# Patient Record
Sex: Male | Born: 1950 | Race: White | Hispanic: No | Marital: Married | Smoking: Never smoker
Health system: Southern US, Community
[De-identification: ages and names within clinical notes are randomized; demographics above are authoritative.]

## PROBLEM LIST (undated history)

## (undated) DIAGNOSIS — E785 Hyperlipidemia, unspecified: Secondary | ICD-10-CM

## (undated) DIAGNOSIS — G5762 Lesion of plantar nerve, left lower limb: Secondary | ICD-10-CM

## (undated) DIAGNOSIS — R7303 Prediabetes: Secondary | ICD-10-CM

## (undated) DIAGNOSIS — E291 Testicular hypofunction: Secondary | ICD-10-CM

## (undated) DIAGNOSIS — R42 Dizziness and giddiness: Secondary | ICD-10-CM

## (undated) DIAGNOSIS — M1990 Unspecified osteoarthritis, unspecified site: Secondary | ICD-10-CM

## (undated) DIAGNOSIS — M109 Gout, unspecified: Secondary | ICD-10-CM

## (undated) DIAGNOSIS — K219 Gastro-esophageal reflux disease without esophagitis: Secondary | ICD-10-CM

## (undated) DIAGNOSIS — M545 Low back pain, unspecified: Secondary | ICD-10-CM

## (undated) DIAGNOSIS — I10 Essential (primary) hypertension: Secondary | ICD-10-CM

## (undated) HISTORY — DX: Hyperlipidemia, unspecified: E78.5

## (undated) HISTORY — DX: Low back pain: M54.5

## (undated) HISTORY — DX: Unspecified osteoarthritis, unspecified site: M19.90

## (undated) HISTORY — DX: Gastro-esophageal reflux disease without esophagitis: K21.9

## (undated) HISTORY — PX: HERNIA REPAIR: SHX51

## (undated) HISTORY — DX: Testicular hypofunction: E29.1

## (undated) HISTORY — DX: Lesion of plantar nerve, left lower limb: G57.62

## (undated) HISTORY — DX: Essential (primary) hypertension: I10

## (undated) HISTORY — PX: EYE SURGERY: SHX253

## (undated) HISTORY — PX: OTHER SURGICAL HISTORY: SHX169

## (undated) HISTORY — PX: FRACTURE SURGERY: SHX138

## (undated) HISTORY — DX: Low back pain, unspecified: M54.50

## (undated) HISTORY — DX: Gout, unspecified: M10.9

---

## 1993-07-30 HISTORY — PX: ACHILLES TENDON SURGERY: SHX542

## 1998-10-02 ENCOUNTER — Emergency Department (HOSPITAL_COMMUNITY): Admission: EM | Admit: 1998-10-02 | Discharge: 1998-10-02 | Payer: Self-pay | Admitting: Emergency Medicine

## 1998-10-02 ENCOUNTER — Encounter: Payer: Self-pay | Admitting: Emergency Medicine

## 2000-07-30 HISTORY — PX: HERNIA REPAIR: SHX51

## 2012-01-02 ENCOUNTER — Ambulatory Visit: Payer: Self-pay | Admitting: Gastroenterology

## 2012-01-02 LAB — HM COLONOSCOPY

## 2012-11-27 LAB — HM HEPATITIS C SCREENING LAB: HM HEPATITIS C SCREENING: NEGATIVE

## 2013-04-06 ENCOUNTER — Ambulatory Visit: Payer: Self-pay | Admitting: Family Medicine

## 2013-04-06 IMAGING — CR DG FOOT COMPLETE 3+V*L*
1 series · 3 of 3 positions shown · non-contrast
Comparison: none

REASON FOR EXAM: L foot pain
COMMENTS:

PROCEDURE:     KDR - KDXR FOOT LT COMP W/OBLIQUES  - [DATE]  [DATE]
RESULT:     There is no evidence of fracture, dislocation, or malalignment.

[Series 1: ap · 0.17mm/px · 3 of 3 slices shown]
[im 1/3]
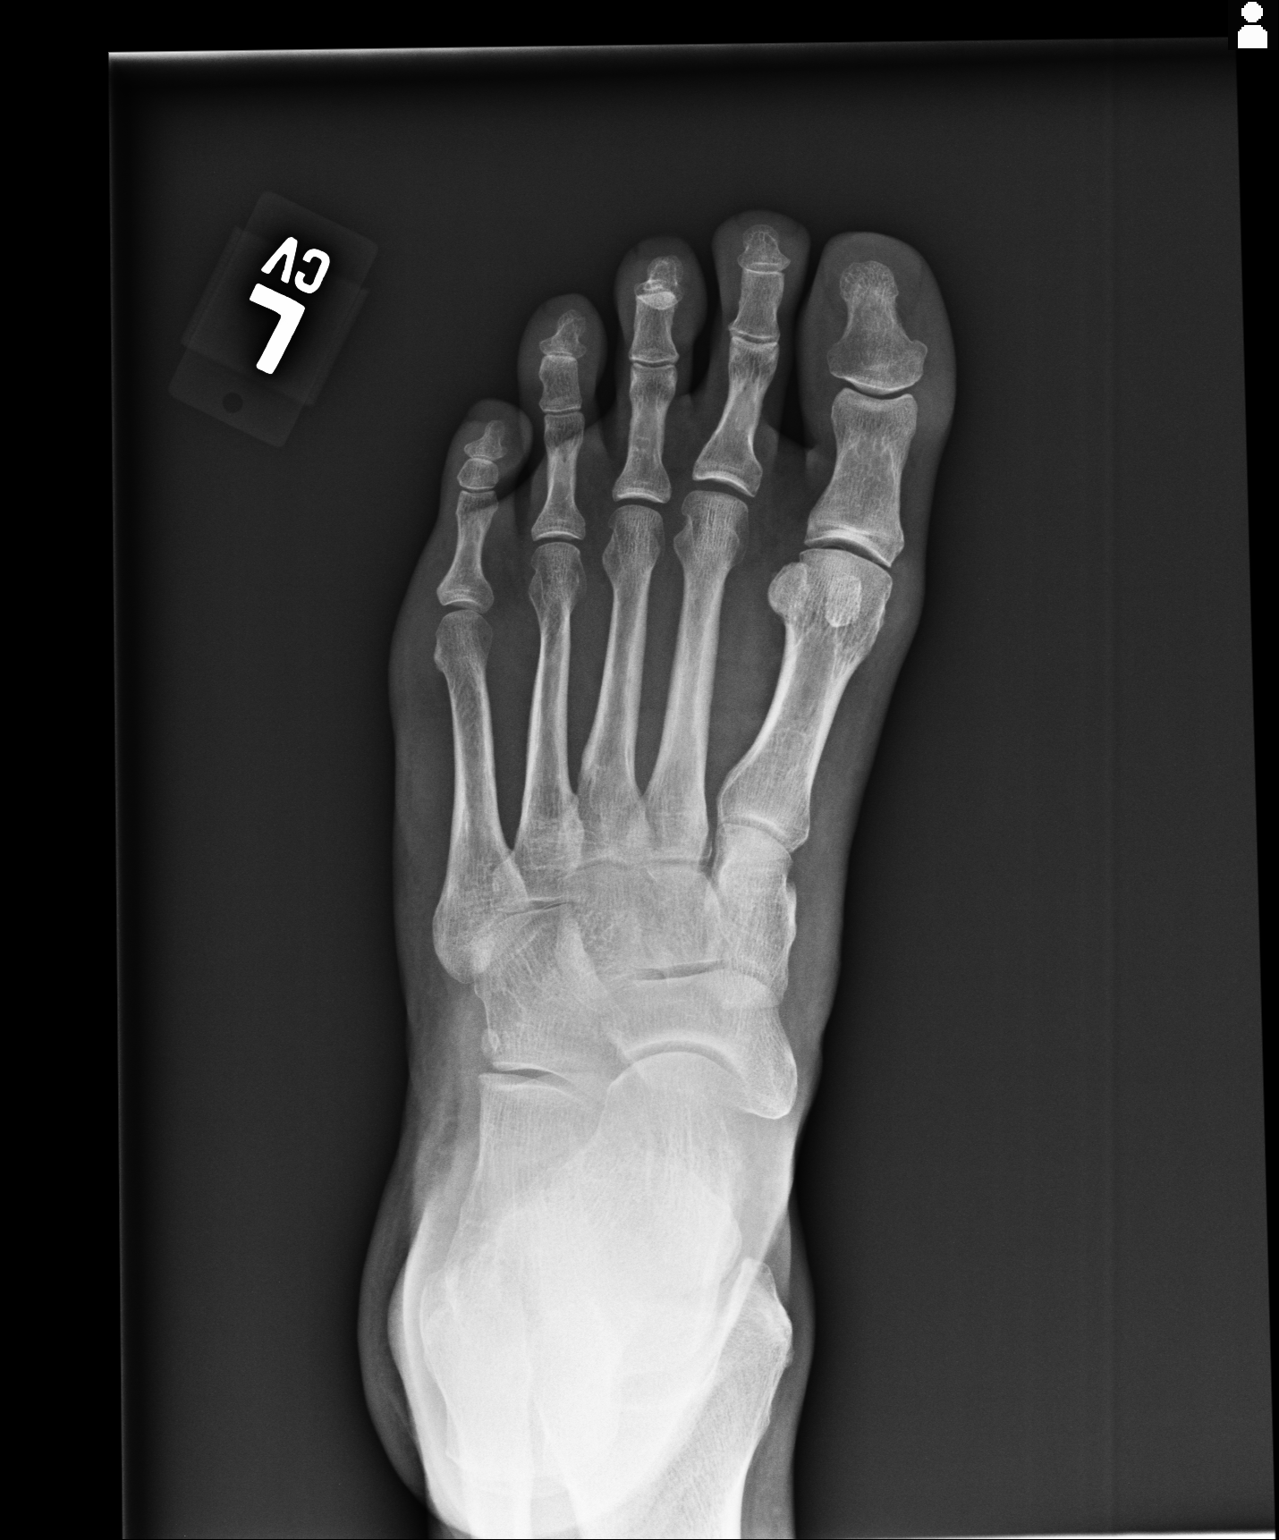
[im 2/3]
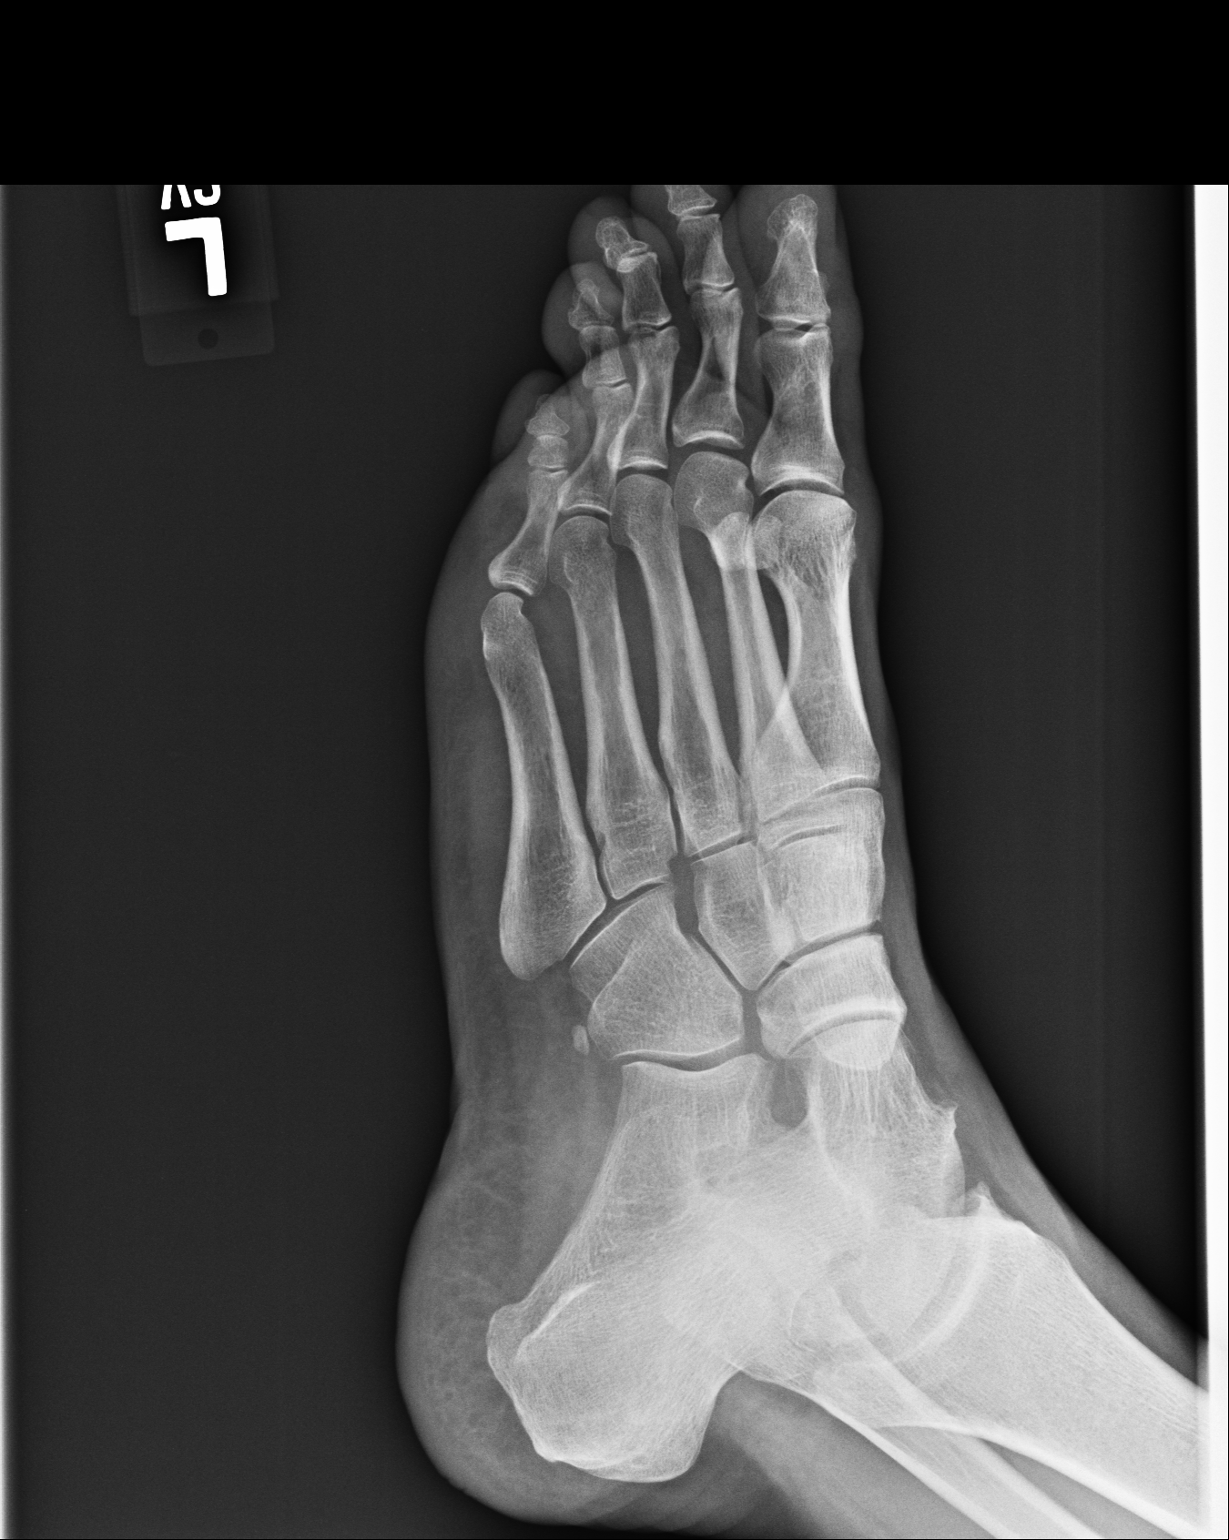
[im 3/3]
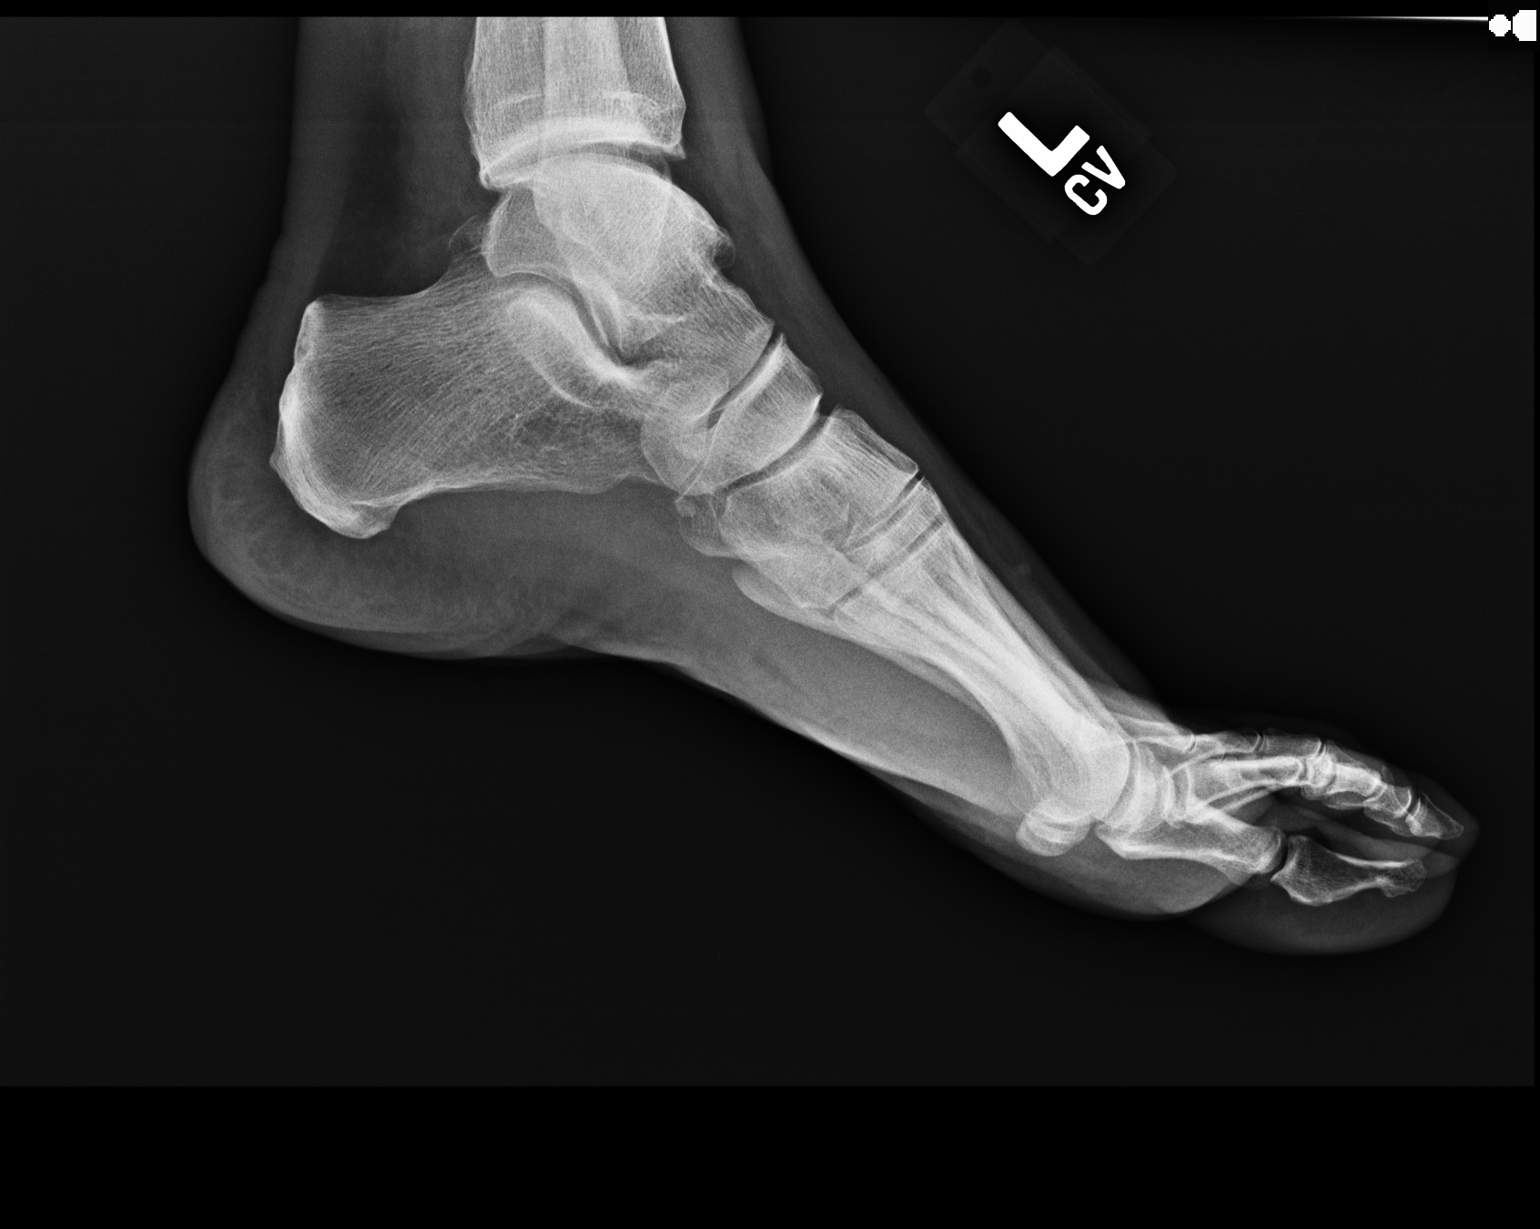

[3 of 3 positions shown; findings below may reference images not displayed]

IMPRESSION: 1. No evidence of acute abnormalities.
2. If there are persistent complaints of pain or persistent clinical
concern, a repeat evaluation in 7-10 days is recommended if clinically
warranted.

## 2015-01-27 ENCOUNTER — Encounter: Payer: Self-pay | Admitting: Family Medicine

## 2015-01-27 ENCOUNTER — Ambulatory Visit (INDEPENDENT_AMBULATORY_CARE_PROVIDER_SITE_OTHER): Payer: BLUE CROSS/BLUE SHIELD | Admitting: Family Medicine

## 2015-01-27 VITALS — BP 140/80 | HR 62 | Temp 97.8°F | Resp 17 | Wt 209.0 lb

## 2015-01-27 DIAGNOSIS — R109 Unspecified abdominal pain: Secondary | ICD-10-CM

## 2015-01-27 LAB — POCT URINALYSIS DIPSTICK
Bilirubin, UA: NEGATIVE
Blood, UA: NEGATIVE
Glucose, UA: NEGATIVE
Ketones, UA: NEGATIVE
Leukocytes, UA: NEGATIVE
Nitrite, UA: NEGATIVE
Protein, UA: NEGATIVE
Spec Grav, UA: <=1.005
Urobilinogen, UA: 0.2
pH, UA: 6

## 2015-01-27 NOTE — Patient Instructions (Signed)
Discussed taking two Aleve twice daily with food. Curtail weight lifting for a week.

## 2015-01-27 NOTE — Progress Notes (Signed)
Subjective:     Patient ID: Curtis [name], male   DOB: 04/29/1951, 64 y.o.   MRN: [id number]  HPI  Chief Complaint  Patient presents with  . Back Pain    Left lower side of back hurts  No hx of kidney stones. Started as a mild pain two weeks ago. Now characterized as a dull pain exacerbated by change in positions. Usually works out with weights and treadmill 3 x week and walking on the weekend.   Review of Systems  Constitutional: Negative for fever and chills.       Objective:   Physical Exam  Constitutional: He appears well-developed and well-nourished. No distress.  Muscle strength in lower extremities 5/5. SLR's to 90 degrees without radiation of back pain. Localizes to left lower flank area     Assessment:     1. Left flank pain-suspect musculoskeletal - POCT urinalysis dipstick    Plan:   Discussed use of nsaid's and holding weight lifting activities. Consider imaging if pain not improving or worsening.

## 2015-01-28 ENCOUNTER — Ambulatory Visit: Payer: Self-pay | Admitting: Urology

## 2015-02-02 ENCOUNTER — Encounter: Payer: Self-pay | Admitting: *Deleted

## 2015-02-03 ENCOUNTER — Ambulatory Visit (INDEPENDENT_AMBULATORY_CARE_PROVIDER_SITE_OTHER): Payer: BLUE CROSS/BLUE SHIELD | Admitting: Urology

## 2015-02-03 ENCOUNTER — Encounter: Payer: Self-pay | Admitting: Urology

## 2015-02-03 VITALS — BP 153/74 | HR 73 | Ht 69.0 in | Wt 207.2 lb

## 2015-02-03 DIAGNOSIS — N401 Enlarged prostate with lower urinary tract symptoms: Secondary | ICD-10-CM

## 2015-02-03 DIAGNOSIS — E291 Testicular hypofunction: Secondary | ICD-10-CM

## 2015-02-03 DIAGNOSIS — N528 Other male erectile dysfunction: Secondary | ICD-10-CM

## 2015-02-03 DIAGNOSIS — E349 Endocrine disorder, unspecified: Secondary | ICD-10-CM | POA: Insufficient documentation

## 2015-02-03 DIAGNOSIS — N529 Male erectile dysfunction, unspecified: Secondary | ICD-10-CM

## 2015-02-03 DIAGNOSIS — N138 Other obstructive and reflux uropathy: Secondary | ICD-10-CM

## 2015-02-03 LAB — BLADDER SCAN AMB NON-IMAGING: SCAN RESULT: 11

## 2015-02-03 MED ORDER — TESTOSTERONE 50 MG/5GM (1%) TD GEL: 5.0000 g | Freq: Every day | TRANSDERMAL | Status: DC

## 2015-02-03 NOTE — Progress Notes (Signed)
02/03/2015 3:11 PM   Curtis [name] [birth year] [id number]  Referring provider: [name], [address] [place], [place] [address]  Chief Complaint  Patient presents with  . Hypogonadism    low testosterone, ED    HPI:  Curtis [name] is a 64 year old white male who has a history of declining testosterone who is referred to [place] by his primary care  provider, Fannie Knee, PA., for hypogonadism.   He is experiencing low libido , erectile dysfunction and difficulty in concentration at work. His most recent testosterone was 183 ng/dL on May 20th 2016.   His most recent hematocrit was 44% on 12/17/2014.   He was initiated on gel therapy approximately 2 years ago.   He did not find it ineffective. He does not have sleep apnea.      Androgen Deficiency in the Aging Male      02/03/15 1400       Androgen Deficiency in the Aging Male   Do you have a decrease in libido (sex drive) Yes     Do you have lack of energy Yes     Do you have a decrease in strength and/or endurance No     Have you lost height No     Have you noticed a decreased "enjoyment of life" No     Are you sad and/or grumpy No     Are your erections less strong Yes     Have you noticed a recent deterioration in your ability to play sports No     Are you falling asleep after dinner Yes     Has there been a recent deterioration in your work performance Yes         His IPSS score today is 6/1 , which is mild symptomatology.   His PVR is 11 mL.   He denies any gross hematuria dysuria or suprapubic pain. He also denies any recent fevers, chills, nausea or vomiting.  PSA was 1.2 ng/mL on 12/17/2014.      IPSS      02/03/15 1400       International Prostate Symptom Score   How often have you had the sensation of not emptying your bladder? Less than 1 in 5     How often have you had to urinate less than every two hours? Not at All     How often have you found you stopped and started again  several times when you urinated? Less than 1 in 5 times     How often have you found it difficult to postpone urination? Not at All     How often have you had a weak urinary stream? About half the time     How often have you had to strain to start urination? Not at All     How many times did you typically get up at night to urinate? 1 Time     Total IPSS Score 6     Quality of Life due to urinary symptoms   If you were to spend the rest of your life with your urinary condition just the way it is now how would you feel about that? Pleased        Score:  1-7 Mild 8-19 Moderate 20-35 Severe  Patient's  SHIM score is 10 , which is moderate erectile dysfunction.       SHIM      02/03/15 1450       SHIM: Over the  last 6 months:   How do you rate your confidence that you could get and keep an erection? Low     When you had erections with sexual stimulation, how often were your erections hard enough for penetration (entering your partner)? Almost Never or Never     During sexual intercourse, how often were you able to maintain your erection after you had penetrated (entered) your partner? Extremely Difficult     During sexual intercourse, how difficult was it to maintain your erection to completion of intercourse? Difficult     When you attempted sexual intercourse, how often was it satisfactory for you? Difficult     SHIM Total Score   SHIM 10        Score: 1-7 Severe ED 8-11 Moderate ED 12-16 Mild-Moderate ED 17-21 Mild ED 22-25 No ED   PMH: Past Medical History  Diagnosis Date  . Hyperlipidemia   . HTN (hypertension)   . Low back pain   . Hypogonadism in male     Surgical History: Past Surgical History  Procedure Laterality Date  . Achilles tendon surgery Right 1995  . Surgery on left pinky in 2000    . Hernia repair  2002    double    Home Medications:    Medication List       This list is accurate as of: 02/03/15  3:11 PM.  Always use your most recent med  list.               aspirin EC 81 MG tablet  Take 81 mg by mouth daily.     b complex-C-E-zinc tablet  Take 1 tablet by mouth daily.     calcium citrate-vitamin D 315-200 MG-UNIT per tablet  Commonly known as:  CITRACAL+D  Take 1 tablet by mouth 2 (two) times daily.     cholecalciferol 400 UNITS Tabs tablet  Commonly known as:  VITAMIN D  Take 800 Units by mouth.     lisinopril-hydrochlorothiazide 20-12.5 MG per tablet  Commonly known as:  PRINZIDE,ZESTORETIC  Take 1 tablet by mouth daily.     Magnesium 200 MG Tabs  Take by mouth.     pravastatin 40 MG tablet  Commonly known as:  PRAVACHOL     testosterone 50 MG/5GM (1%) Gel  Commonly known as:  ANDROGEL  Place 5 g onto the skin daily.     Zinc Sulfate Heptahydrate Powd  by Does not apply route.        Allergies: No Known Allergies  Family History: Family History  Problem Relation Age of Onset  . Hypertension Mother   . Arthritis Mother   . Vision loss Mother     due to age  . Cancer Father     in the liver    Social History:  reports that he has never smoked. He does not have any smokeless tobacco history on file. He reports that he drinks about 1.2 oz of alcohol per week. He reports that he does not use illicit drugs.  ROS: Urological Symptom Review  Patient is experiencing the following symptoms: Weak stream Erection problems (male only)   Review of Systems  Gastrointestinal (upper)  : Negative for upper GI symptoms  Gastrointestinal (lower) : Negative for lower GI symptoms  Constitutional : Negative for symptoms  Skin: Negative for skin symptoms  Eyes: Negative for eye symptoms  Ear/Nose/Throat : Negative for Ear/Nose/Throat symptoms  Hematologic/Lymphatic: Negative for Hematologic/Lymphatic symptoms  Cardiovascular : Negative for cardiovascular symptoms  Respiratory : Negative for respiratory symptoms  Endocrine: Negative for endocrine symptoms  Musculoskeletal: Back  pain  Neurological: Negative for neurological symptoms  Psychologic: Negative for psychiatric symptoms   Physical Exam: BP 153/74 mmHg  Pulse 73  Ht 5\' 9"  (1.753 m)  Wt 207 lb 3.2 oz (93.985 kg)  BMI 30.58 kg/m2  Constitutional:  Alert and oriented, No acute distress. HEENT: [place] AT, moist mucus membranes.  Trachea midline, no masses. Cardiovascular: No clubbing, cyanosis, or edema. Respiratory: Normal respiratory effort, no increased work of breathing. GI: Abdomen is soft, nontender, nondistended, no abdominal masses GU: No CVA tenderness. GU: Patient with  circumcised phallus.  Urethral meatus is patent.  No penile discharge. No penile lesions or rashes. Scrotum without lesions, cysts, rashes and/or edema.  Testicles are located scrotally bilaterally.  Left testicle is atrophic.No masses are appreciated in the testicles. Left and right epididymis are normal. Rectal: Patient with  normal sphincter tone. Perineum without scarring or rashes. No rectal masses are appreciated. Prostate is approximately 50 grams, no nodules are appreciated. Seminal vesicles are normal. Skin: No rashes, bruises or suspicious lesions. Lymph: No cervical or inguinal adenopathy. Neurologic: Grossly intact, no focal deficits, moving all 4 extremities. Psychiatric: Normal mood and affect.  Laboratory Data: Results for orders placed or performed in visit on 02/03/15  Bladder Scan (Post Void Residual) in office  Result Value Ref Range   Scan Result 11    No results found for: WBC, HGB, HCT, MCV, PLT  No results found for: CREATININE  No results found for: PSA  No results found for: TESTOSTERONE  No results found for: HGBA1C  Urinalysis    Component Value Date/Time   BILIRUBINUR negative 01/27/2015 1057   PROTEINUR negative 01/27/2015 1057   UROBILINOGEN 0.2 01/27/2015 1057   NITRITE negative 01/27/2015 1057   LEUKOCYTESUR Negative 01/27/2015 1057    Pertinent Imaging:   Assessment & Plan:      1.   Hypogonadism:     I explained to the patient that treatment options are insurance driven due to the high cost of testosterone therapy. His insurance carrier preferred agent is [facility]. We will have to initiate therapy with the [facility], so I have submitted a prescription to his pharmacy for this medication.   We have discussed  transference issues.     I also discussed with the patient the side effects of testosterone therapy, such as: enlargement of the prostate gland that may in turn cause LUTS, possible increased risk of PCa, DVT's and/or PE's, possible increased risk of heart attack or stroke, lower sperm count, swelling of the ankles, feet, or body, with or without heart failure, enlarged or painful breasts, have problems breathing while you sleep (sleep apnea), increased prostate specific antigen, mood swings, hypertension and  increased red blood cell count.   I have obtained an LH, [place] and prolactin level today  2.   BPH with LUTS:    Patient's IPSS score is 6 / 1. His PVR is  11 mL.   We will continue to monitor. He'll return in 6 months for IPSS score, DRE and PSA.  3.    Erectile dysfunction:   Patient's SHIM score is  10. We will readdress in one month with a SHIM and symptom recheck.   There are no diagnoses linked to this encounter.  No Follow-up on file.  Zara Council, [facility] [address] [place], [place] [address] [phone]

## 2015-02-04 ENCOUNTER — Telehealth: Payer: Self-pay

## 2015-02-04 LAB — PROLACTIN: Prolactin: 7.8 ng/mL (ref 4.0–15.2)

## 2015-02-04 LAB — FSH/LH
FSH: 11.3 m[IU]/mL (ref 1.5–12.4)
LH: 7.2 m[IU]/mL (ref 1.7–8.6)

## 2015-02-04 NOTE — Telephone Encounter (Signed)
Patient notified of normal blood work.  He is out of town and has not attempted to pick up his prescription yet.  He will let us know if he has any problems.

## 2015-02-04 NOTE — Telephone Encounter (Signed)
-----   Message from Nori Riis, PA-C sent at 02/04/2015  9:16 AM EDT ----- Blood work is normal.  Was he able to fill his AndroGel prescription?

## 2015-02-04 NOTE — Telephone Encounter (Signed)
LMOM

## 2015-02-07 NOTE — Telephone Encounter (Signed)
Spoke with pt in reference to blood work. Pt stated he did get a call that AndroGel was ready, but did not pick up. Pt stated he has an 47 week old grandchild and with the side effects of gel pt does not wish to continue with tx at the time. Reinforced with pt he has an appt in August. Pt voiced understanding stating he will keep appt for now. Cw,lpn

## 2015-02-07 NOTE — Telephone Encounter (Signed)
-----   Message from Nori Riis, PA-C sent at 02/04/2015  9:16 AM EDT ----- Blood work is normal.  Was he able to fill his AndroGel prescription?

## 2015-03-04 ENCOUNTER — Ambulatory Visit: Payer: Self-pay | Admitting: Urology

## 2015-03-14 ENCOUNTER — Ambulatory Visit: Payer: Self-pay | Admitting: Urology

## 2015-03-28 ENCOUNTER — Telehealth: Payer: Self-pay

## 2015-03-28 NOTE — Telephone Encounter (Signed)
Refill request received from [facility] requesting Lisinopril-HCTZ 20-12.5 mg tablets.

## 2015-03-29 ENCOUNTER — Other Ambulatory Visit: Payer: Self-pay

## 2015-03-29 MED ORDER — LISINOPRIL-HYDROCHLOROTHIAZIDE 20-12.5 MG PO TABS: 1.0000 | ORAL_TABLET | Freq: Every day | ORAL | Status: DC

## 2015-03-29 NOTE — Telephone Encounter (Signed)
RX sent to pharmacy  

## 2015-03-31 ENCOUNTER — Telehealth: Payer: Self-pay | Admitting: Family Medicine

## 2015-03-31 NOTE — Telephone Encounter (Signed)
Patient is requesting refill for lisinopril-hydrochlorothiazide (PRINZIDE,ZESTORETIC) 20-12.5 MG per tablet   [place]

## 2015-04-01 MED ORDER — LISINOPRIL-HYDROCHLOROTHIAZIDE 20-12.5 MG PO TABS: 1.0000 | ORAL_TABLET | Freq: Every day | ORAL | Status: DC

## 2015-04-13 ENCOUNTER — Other Ambulatory Visit: Payer: Self-pay | Admitting: Family Medicine

## 2015-04-14 NOTE — Telephone Encounter (Signed)
Pt's wife stated that pt has been out of his lisinopril-hydrochlorothiazide (PRINZIDE,ZESTORETIC) 20-12.5 MG per tablet for about 3 weeks and we were have supposed to be sending it to [facility]. On 04/01/15 it shows that it was printed. I advised that I would request it to be resent. Wife stated that pt's BP has been b/t 172/84 to 177/84. Thanks TNP

## 2015-04-14 NOTE — Telephone Encounter (Signed)
RX resent to [facility].

## 2015-11-11 ENCOUNTER — Encounter: Payer: Self-pay | Admitting: Family Medicine

## 2015-11-11 ENCOUNTER — Ambulatory Visit (INDEPENDENT_AMBULATORY_CARE_PROVIDER_SITE_OTHER): Payer: 59 | Admitting: Family Medicine

## 2015-11-11 VITALS — BP 130/98 | HR 65 | Temp 98.1°F | Resp 14 | Ht 69.5 in | Wt 202.4 lb

## 2015-11-11 DIAGNOSIS — Z114 Encounter for screening for human immunodeficiency virus [HIV]: Secondary | ICD-10-CM | POA: Diagnosis not present

## 2015-11-11 DIAGNOSIS — N401 Enlarged prostate with lower urinary tract symptoms: Secondary | ICD-10-CM | POA: Diagnosis not present

## 2015-11-11 DIAGNOSIS — Z0000 Encounter for general adult medical examination without abnormal findings: Secondary | ICD-10-CM

## 2015-11-11 DIAGNOSIS — Z23 Encounter for immunization: Secondary | ICD-10-CM | POA: Diagnosis not present

## 2015-11-11 DIAGNOSIS — I10 Essential (primary) hypertension: Secondary | ICD-10-CM | POA: Diagnosis not present

## 2015-11-11 DIAGNOSIS — N138 Other obstructive and reflux uropathy: Secondary | ICD-10-CM

## 2015-11-11 DIAGNOSIS — E785 Hyperlipidemia, unspecified: Secondary | ICD-10-CM

## 2015-11-11 LAB — POCT URINALYSIS DIPSTICK
BILIRUBIN UA: NEGATIVE
Glucose, UA: NEGATIVE
KETONES UA: NEGATIVE
LEUKOCYTES UA: NEGATIVE
Nitrite, UA: NEGATIVE
PH UA: 6.5
PROTEIN UA: NEGATIVE
RBC UA: NEGATIVE
SPEC GRAV UA: 1.01
Urobilinogen, UA: 0.2

## 2015-11-11 NOTE — Progress Notes (Signed)
Patient ID: Curtis [name], male   DOB: [birth year], 65 y.o.   MRN: [id number]       Patient: Curtis [name], Male    DOB: 1951/01/04, 65 y.o.   MRN: [id number] Visit Date: 11/11/2015  Today's Provider: [name], PA   Chief Complaint  Patient presents with  . Annual Exam   Subjective:    Annual physical exam Curtis [name] is a 65 y.o. male who presents today for health maintenance and complete physical. He feels well. He reports exercising 4 times weekly. He reports he is sleeping average 6-7 hours per night.  ----------------------------------------------------------------- Tdap: 10/03/2010 Colonoscopy: 01/02/2012  Review of Systems  Constitutional: Negative.   HENT: Negative.   Eyes: Negative.   Respiratory: Negative.   Cardiovascular: Negative.   Gastrointestinal: Negative.   Endocrine: Negative.   Genitourinary: Negative.   Musculoskeletal: Negative.   Skin: Negative.   Allergic/Immunologic: Negative.   Neurological: Negative.   Hematological: Negative.   Psychiatric/Behavioral: Negative.     Social History      He  reports that he has never smoked. He does not have any smokeless tobacco history on file. He reports that he drinks about 1.2 oz of alcohol per week. He reports that he does not use illicit drugs.       Social History   Social History  . Marital Status: Married    Spouse Name: N/A  . Number of Children: N/A  . Years of Education: N/A   Occupational History  . Camera operator    Social History Main Topics  . Smoking status: Never Smoker   . Smokeless tobacco: None  . Alcohol Use: 1.2 oz/week    2 Standard drinks or equivalent per week     Comment: two beers once a week  . Drug Use: No  . Sexual Activity: Not Asked   Other Topics Concern  . None   Social History Narrative    Past Medical History  Diagnosis Date  . Hyperlipidemia   . HTN (hypertension)   . Low back pain   . Hypogonadism in male       Patient Active Problem List   Diagnosis Date Noted  . Hypogonadism in male 02/03/2015  . 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 Erectile dysfunction of organic origin 02/03/2015    Past Surgical History  Procedure Laterality Date  . Achilles tendon surgery Right 1995  . Surgery on left pinky in 2000    . Hernia repair  2002    double    Family History        Family Status  Relation Status Death Age  . Mother Alive   . Father Deceased 61    died from cancer in the liver  . Brother Alive   . Daughter Alive   . Daughter Alive         His family history includes Arthritis in his mother; Cancer in his father; Hypertension in his mother; Vision loss in his mother.    No Known Allergies  Previous Medications   ASPIRIN EC 81 MG TABLET    Take 81 mg by mouth daily.   LISINOPRIL-HYDROCHLOROTHIAZIDE (PRINZIDE,ZESTORETIC) 20-12.5 MG PER TABLET    TAKE ONE TABLET BY MOUTH ONCE DAILY   PRAVASTATIN (PRAVACHOL) 40 MG TABLET    Reported on 11/11/2015   TESTOSTERONE (ANDROGEL) 50 MG/5GM (1%) GEL    Place 5 g onto the skin daily.   UNABLE TO FIND    Med Name:  Pro- Bio 5   UNABLE TO FIND    Med Name: Bio Cleanse   UNABLE TO FIND    Med Name: Plexus Slim   ZINC SULFATE HEPTAHYDRATE POWD    by Does not apply route.    Patient Care Team: [name], PA as PCP - General (Physician Assistant)     Objective:   Vitals: BP 130/98 mmHg  Pulse 65  Temp(Src) 98.1 F (36.7 C) (Oral)  Resp 14  Ht 5' 9.5" (1.765 m)  Wt 202 lb 6.4 oz (91.808 kg)  BMI 29.47 kg/m2  Wt Readings from Last 3 Encounters:  11/11/15 202 lb 6.4 oz (91.808 kg)  02/03/15 207 lb 3.2 oz (93.985 kg)  01/27/15 209 lb (94.802 kg)    Physical Exam  Constitutional: He is oriented to person, place, and time. He appears well-developed and well-nourished.  HENT:  Head: Normocephalic and atraumatic.  Right Ear: External ear normal.  Left Ear: External ear normal.  Nose: Nose normal.   Mouth/Throat: Oropharynx is clear and moist.  Eyes: Conjunctivae and EOM are normal. Pupils are equal, round, and reactive to light. Right eye exhibits no discharge.  Neck: Normal range of motion. Neck supple. No tracheal deviation present. No thyromegaly present.  Cardiovascular: Normal rate, regular rhythm, normal heart sounds and intact distal pulses.   No murmur heard. Pulmonary/Chest: Effort normal and breath sounds normal. No respiratory distress. He has no wheezes. He has no rales. He exhibits no tenderness.  Abdominal: Soft. He exhibits no distension and no mass. There is no tenderness. There is no rebound and no guarding.  Genitourinary: Penis normal. Guaiac negative stool.  Prostate enlarged with questionable nodule.  Musculoskeletal: Normal range of motion. He exhibits no edema or tenderness.  Lymphadenopathy:    He has no cervical adenopathy.  Neurological: He is alert and oriented to person, place, and time. He has normal reflexes. No cranial nerve deficit. He exhibits normal muscle tone. Coordination normal.  Skin: Skin is warm and dry. No rash noted. No erythema.  Psychiatric: He has a normal mood and affect. His behavior is normal. Judgment and thought content normal.   Depression Screen PHQ 2/9 Scores 11/11/2015  PHQ - 2 Score 0   Assessment & Plan:     Routine Health Maintenance and Physical Exam  Exercise Activities and Dietary recommendations Goals    Continue work out at the gym 3 days a week and walk with wife once a week.      Immunization History  Administered Date(s) Administered  . Tdap 10/03/2010    Health Maintenance  Topic Date Due  . HIV Screening  03/14/1966  . ZOSTAVAX  03/15/2011  . INFLUENZA VACCINE  02/28/2016  . TETANUS/TDAP  10/02/2020  . COLONOSCOPY  01/01/2022  . Hepatitis C Screening  Completed      Discussed health benefits of physical activity, and encouraged him to engage in regular exercise appropriate for his age and  condition.    -------------------------------------------------------------------- 1. Annual physical exam General health good. Planning to retire in the Fall of this year. Will give Zostavax today. Other immunizations up to date. Continue present exercise program and get routine labs. - POCT urinalysis dipstick  2. BPH with obstruction/lower urinary tract symptoms Still has some enlargement of gland posteriorly with questionable lump. Will recheck PSA. Only obstructive symptoms are the 1-2 times of nocturia each night and slight slow down of stream. Has a history of low testosterone but decided to stop using any Androgel/testosterone supplements. -  PSA  3. Essential hypertension Continues to take Lisinopril/HCTZ 20/12.5 mg qd without side effects. Recheck of BP after initial check 15 minutes ago was 148/68 ("that is closer to my usual readings at home"). Will continue present dosage and recheck routine labs.  4. Hyperlipidemia Stopped taking the Pravastatin. "Want to experiment" with diet and exercise. Will recheck levels and follow up pending reports.  - CBC with Differential/Platelet - Comprehensive metabolic panel - Lipid panel  5. Screening for HIV (human immunodeficiency virus) - HIV antibody (with reflex)

## 2015-11-12 LAB — HIV ANTIBODY (ROUTINE TESTING W REFLEX): HIV SCREEN 4TH GENERATION: NONREACTIVE

## 2015-11-12 LAB — LIPID PANEL
CHOLESTEROL TOTAL: 229 mg/dL — AB (ref 100–199)
Chol/HDL Ratio: 6.5 ratio units — ABNORMAL HIGH (ref 0.0–5.0)
HDL: 35 mg/dL — ABNORMAL LOW (ref >39)
LDL Calculated: 143 mg/dL — ABNORMAL HIGH (ref 0–99)
Triglycerides: 254 mg/dL — ABNORMAL HIGH (ref 0–149)
VLDL CHOLESTEROL CAL: 51 mg/dL — AB (ref 5–40)

## 2015-11-12 LAB — CBC WITH DIFFERENTIAL/PLATELET
BASOS ABS: 0 10*3/uL (ref 0.0–0.2)
Basos: 0 %
EOS (ABSOLUTE): 0.1 10*3/uL (ref 0.0–0.4)
Eos: 2 %
Hematocrit: 43.5 % (ref 37.5–51.0)
Hemoglobin: 15 g/dL (ref 12.6–17.7)
Immature Grans (Abs): 0 10*3/uL (ref 0.0–0.1)
Immature Granulocytes: 1 %
LYMPHS ABS: 2.1 10*3/uL (ref 0.7–3.1)
Lymphs: 28 %
MCH: 30.3 pg (ref 26.6–33.0)
MCHC: 34.5 g/dL (ref 31.5–35.7)
MCV: 88 fL (ref 79–97)
MONOS ABS: 0.8 10*3/uL (ref 0.1–0.9)
Monocytes: 11 %
NEUTROS ABS: 4.4 10*3/uL (ref 1.4–7.0)
Neutrophils: 58 %
PLATELETS: 169 10*3/uL (ref 150–379)
RBC: 4.95 x10E6/uL (ref 4.14–5.80)
RDW: 12.4 % (ref 12.3–15.4)
WBC: 7.6 10*3/uL (ref 3.4–10.8)

## 2015-11-12 LAB — COMPREHENSIVE METABOLIC PANEL
ALBUMIN: 4.4 g/dL (ref 3.6–4.8)
ALK PHOS: 46 IU/L (ref 39–117)
ALT: 24 IU/L (ref 0–44)
AST: 18 IU/L (ref 0–40)
Albumin/Globulin Ratio: 1.8 (ref 1.2–2.2)
BILIRUBIN TOTAL: 0.5 mg/dL (ref 0.0–1.2)
BUN / CREAT RATIO: 15 (ref 10–24)
BUN: 15 mg/dL (ref 8–27)
CHLORIDE: 103 mmol/L (ref 96–106)
CO2: 24 mmol/L (ref 18–29)
Calcium: 9.4 mg/dL (ref 8.6–10.2)
Creatinine, Ser: 0.97 mg/dL (ref 0.76–1.27)
GFR calc Af Amer: 95 mL/min/{1.73_m2} (ref >59)
GFR calc non Af Amer: 82 mL/min/{1.73_m2} (ref >59)
GLUCOSE: 102 mg/dL — AB (ref 65–99)
Globulin, Total: 2.4 g/dL (ref 1.5–4.5)
Potassium: 4.4 mmol/L (ref 3.5–5.2)
Sodium: 143 mmol/L (ref 134–144)
Total Protein: 6.8 g/dL (ref 6.0–8.5)

## 2015-11-12 LAB — PSA: Prostate Specific Ag, Serum: 1.6 ng/mL (ref 0.0–4.0)

## 2015-11-14 ENCOUNTER — Telehealth: Payer: Self-pay

## 2015-11-14 NOTE — Telephone Encounter (Signed)
-----   Message from [place], [place] sent at 11/13/2015 10:23 PM EDT ----- All blood tests essentially normal except cholesterol and triglycerides much higher (was normal 12-17-14 on the Pravastatin 40 mg qd). Recommend restarting Pravastatin and rechecking levels in 6 months.

## 2015-11-14 NOTE — Telephone Encounter (Signed)
Patient advised as directed below. Patient verbalized understanding and agrees with plan of care.  

## 2015-11-17 ENCOUNTER — Telehealth: Payer: Self-pay | Admitting: Family Medicine

## 2015-11-17 MED ORDER — PRAVASTATIN SODIUM 40 MG PO TABS: 40.0000 mg | ORAL_TABLET | Freq: Every day | ORAL | Status: DC

## 2015-11-17 NOTE — Telephone Encounter (Signed)
Pt stated that he was advised on 11/14/15 that he should start taking pravastatin (PRAVACHOL) 40 MG tablet again. Pt stated it has been sent to the pharmacy yet and would like it sent to [place] today if possible. Please advise. Thanks TNP

## 2015-11-17 NOTE — Telephone Encounter (Signed)
Pravastatin was sent to [facility] today.

## 2016-05-03 ENCOUNTER — Other Ambulatory Visit: Payer: Self-pay | Admitting: Family Medicine

## 2016-08-03 ENCOUNTER — Encounter: Payer: Self-pay | Admitting: Family Medicine

## 2016-08-03 ENCOUNTER — Ambulatory Visit (INDEPENDENT_AMBULATORY_CARE_PROVIDER_SITE_OTHER): Payer: Medicare Other | Admitting: Family Medicine

## 2016-08-03 VITALS — BP 154/96 | HR 71 | Temp 98.3°F | Resp 16 | Wt 220.4 lb

## 2016-08-03 DIAGNOSIS — J029 Acute pharyngitis, unspecified: Secondary | ICD-10-CM

## 2016-08-03 LAB — POCT RAPID STREP A (OFFICE): RAPID STREP A SCREEN: NEGATIVE

## 2016-08-03 MED ORDER — FIRST-DUKES MOUTHWASH MT SUSP: OROMUCOSAL | 0 refills | Status: DC

## 2016-08-03 NOTE — Progress Notes (Signed)
Patient: Curtis [name] Male    DOB: 29-Aug-1950   66 y.o.   MRN: [id number] Visit Date: 08/03/2016  Today's Provider: [name], PA   Chief Complaint  Patient presents with  . Sore Throat   Subjective:    Sore Throat   This is a new problem. Episode onset: 2-3 days ago. The problem has been gradually improving. The pain is worse on the left side. There has been no fever. He has tried gargles and cool liquids (alka seltzer) for the symptoms. The treatment provided mild relief.   Past Medical History:  Diagnosis Date  . HTN (hypertension)   . Hyperlipidemia   . Hypogonadism in male   . Low back pain    Patient Active Problem List   Diagnosis Date Noted  . Hypertension 11/11/2015  . Hyperlipidemia 11/11/2015  . Hypogonadism in male 02/03/2015  . 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 ACHILLES TENDON SURGERY Right 1995  . HERNIA REPAIR  2002   double  . Surgery on Left pinky in 2000     Family History  Problem Relation Age of Onset  . Hypertension Mother   . Arthritis Mother   . Vision loss Mother     due to age  . Cancer Father     in the liver   No Known Allergies   Previous Medications   ASPIRIN EC 81 MG TABLET    Take 81 mg by mouth daily.   LISINOPRIL-HYDROCHLOROTHIAZIDE (PRINZIDE,ZESTORETIC) 20-12.5 MG TABLET    TAKE ONE TABLET BY MOUTH ONCE DAILY   PRAVASTATIN (PRAVACHOL) 40 MG TABLET    Take 1 tablet (40 mg total) by mouth daily.   UNABLE TO FIND    Med Name: Pro- Bio 5   UNABLE TO FIND    Med Name: Bio Cleanse   UNABLE TO FIND    Med Name: Plexus Slim   ZINC SULFATE HEPTAHYDRATE POWD    by Does not apply route.    Review of Systems  Constitutional: Negative.   HENT: Positive for sore throat.   Respiratory: Negative.   Cardiovascular: Negative.     Social History  Substance Use Topics  . Smoking status:  Never Smoker  . Smokeless tobacco: Not on file  . Alcohol use 1.2 oz/week    2 Standard drinks or equivalent per week     Comment: two beers once a week   Objective:   BP (!) 154/96 (BP Location: Right Arm, Patient Position: Sitting, Cuff Size: Normal)   Pulse 71   Temp 98.3 F (36.8 C) (Oral)   Resp 16   Wt 220 lb 6.4 oz (100 kg)   SpO2 97%   BMI 32.08 kg/m   Physical Exam  Constitutional: He is oriented to person, place, and time. He appears well-developed and well-nourished.  HENT:  Head: Normocephalic.  Right Ear: External ear normal.  Left Ear: External ear normal.  Nose: Nose normal.  Slightly reddened posterior pharynx without exudates.  Eyes: Conjunctivae are normal.  Neck: Neck supple.  Cardiovascular: Normal rate and regular rhythm.   Pulmonary/Chest: Effort normal and breath sounds normal.  Abdominal: Soft.  Lymphadenopathy:    He has no cervical adenopathy.  Neurological: He is alert and oriented to person, place, and time.  Skin: No rash noted.      Assessment & Plan:     1. Sore throat  Onset over the past 2-3 days. No fever, cough or congestion. [place] gargles have been helpful but still having significant sorethroat at night. Feeling better today. Negative strep test. Will treat with Duke's Mouthwash and recheck prn. - POCT rapid strep A - Diphenhyd-Hydrocort-Nystatin (FIRST-DUKES MOUTHWASH) SUSP; Gargle and swallow one teaspoon after meals and at bedtime for throat pain.  Dispense: 237 mL; Refill: 0

## 2016-11-09 ENCOUNTER — Telehealth: Payer: Self-pay | Admitting: Family Medicine

## 2016-11-09 NOTE — Telephone Encounter (Signed)
Called Pt to schedule AWV with [facility] 4/20?- knb

## 2016-12-28 ENCOUNTER — Encounter: Payer: Medicare Other | Admitting: Family Medicine

## 2017-01-01 ENCOUNTER — Ambulatory Visit (INDEPENDENT_AMBULATORY_CARE_PROVIDER_SITE_OTHER): Payer: Medicare Other | Admitting: Family Medicine

## 2017-01-01 ENCOUNTER — Encounter: Payer: Self-pay | Admitting: Family Medicine

## 2017-01-01 VITALS — BP 128/84 | HR 59 | Temp 98.2°F | Ht 70.0 in | Wt 206.6 lb

## 2017-01-01 DIAGNOSIS — N138 Other obstructive and reflux uropathy: Secondary | ICD-10-CM

## 2017-01-01 DIAGNOSIS — E782 Mixed hyperlipidemia: Secondary | ICD-10-CM | POA: Diagnosis not present

## 2017-01-01 DIAGNOSIS — E291 Testicular hypofunction: Secondary | ICD-10-CM | POA: Diagnosis not present

## 2017-01-01 DIAGNOSIS — Z0000 Encounter for general adult medical examination without abnormal findings: Secondary | ICD-10-CM | POA: Diagnosis not present

## 2017-01-01 DIAGNOSIS — N401 Enlarged prostate with lower urinary tract symptoms: Secondary | ICD-10-CM | POA: Diagnosis not present

## 2017-01-01 DIAGNOSIS — Z23 Encounter for immunization: Secondary | ICD-10-CM | POA: Diagnosis not present

## 2017-01-01 DIAGNOSIS — I10 Essential (primary) hypertension: Secondary | ICD-10-CM | POA: Diagnosis not present

## 2017-01-01 LAB — POCT URINALYSIS DIPSTICK
BILIRUBIN UA: NEGATIVE
Blood, UA: NEGATIVE
GLUCOSE UA: NEGATIVE
Ketones, UA: NEGATIVE
Leukocytes, UA: NEGATIVE
NITRITE UA: NEGATIVE
Protein, UA: NEGATIVE
Spec Grav, UA: 1.01 (ref 1.010–1.025)
Urobilinogen, UA: 0.2 E.U./dL
pH, UA: 6 (ref 5.0–8.0)

## 2017-01-01 NOTE — Patient Instructions (Signed)

## 2017-01-01 NOTE — Progress Notes (Signed)
Patient: Curtis [name], Male    DOB: 03-27-1951, 66 y.o.   MRN: [id number] Visit Date: 01/01/2017  Today's Provider: [name], PA  [facility] Chief Complaint  Patient presents with  . Medicare Wellness   Subjective:   Initial preventative physical exam [name] is a 66 y.o. male who presents today for his Initial Preventative Physical Exam. He feels well. He reports exercising 4 times per week. He reports he is sleeping well.   Review of Systems  Constitutional: Negative.   HENT: Positive for tinnitus.   Eyes: Positive for itching.  Respiratory: Negative.   Cardiovascular: Negative.   Gastrointestinal: Negative.   Endocrine: Negative.   Genitourinary: Negative.   Musculoskeletal: Negative.   Skin: Negative.   Allergic/Immunologic: Negative.   Neurological: Negative.   Hematological: Negative.   Psychiatric/Behavioral: Negative.     Social History   Social History  . Marital status: Married    Spouse name: N/A  . Number of children: N/A  . Years of education: N/A   Occupational History  . Camera operator    Social History Main Topics  . Smoking status: Never Smoker  . Smokeless tobacco: Never Used  . Alcohol use 1.2 oz/week    2 Standard drinks or equivalent per week     Comment: two beers once a week  . Drug use: No  . Sexual activity: Not on file   Other Topics Concern  . Not on file   Social History Narrative  . No narrative on file    Past Medical History:  Diagnosis Date  . HTN (hypertension)   . Hyperlipidemia   . Hypogonadism in male   . Low back pain      Patient Active Problem List   Diagnosis Date Noted  . Hypertension 11/11/2015  . Hyperlipidemia 11/11/2015  . Hypogonadism in male 02/03/2015  . 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 Erectile dysfunction of organic origin 02/03/2015    Past Surgical History:  Procedure Laterality Date  . ACHILLES TENDON SURGERY Right 1995    . HERNIA REPAIR  2002   double  . Surgery on Left pinky in 2000      His family history includes Arthritis in his mother; Cancer in his father; Hypertension in his mother; Vision loss in his mother.      Current Outpatient Prescriptions:  .  aspirin EC 81 MG tablet, Take 81 mg by mouth daily., Disp: , Rfl:  .  Diphenhyd-Hydrocort-Nystatin (FIRST-DUKES MOUTHWASH) SUSP, Gargle and swallow one teaspoon after meals and at bedtime for throat pain., Disp: 237 mL, Rfl: 0 .  lisinopril-hydrochlorothiazide (PRINZIDE,ZESTORETIC) 20-12.5 MG tablet, TAKE ONE TABLET BY MOUTH ONCE DAILY, Disp: 90 tablet, Rfl: 3 .  pravastatin (PRAVACHOL) 40 MG tablet, Take 1 tablet (40 mg total) by mouth daily., Disp: 90 tablet, Rfl: 3 .  UNABLE TO FIND, Med Name: [employer] Greens, Disp: , Rfl:  .  UNABLE TO FIND, Med Name: Protein Powder, Disp: , Rfl:  .  Zinc Sulfate Heptahydrate POWD, by Does not apply route., Disp: , Rfl:    Patient Care Team: Chrismon, [name], PA as PCP - General (Physician Assistant)      Objective:   Vitals: BP 128/84 (BP Location: Right Arm, Patient Position: Sitting, Cuff Size: Normal)   Pulse (!) 59   Temp 98.2 F (36.8 C) (Oral)   Ht 5\' 10"  (1.778 m)   Wt 206 lb 9.6 oz (93.7 kg)  SpO2 97%   BMI 29.64 kg/m   Physical Exam  Constitutional: He is oriented to person, place, and time. He appears well-developed and well-nourished.  HENT:  Head: Normocephalic and atraumatic.  Right Ear: External ear normal.  Left Ear: External ear normal.  Nose: Nose normal.  Mouth/Throat: Oropharynx is clear and moist.  Eyes: Conjunctivae and EOM are normal. Pupils are equal, round, and reactive to light. Right eye exhibits no discharge.  Neck: Normal range of motion. Neck supple. No tracheal deviation present. No thyromegaly present.  Cardiovascular: Normal rate, regular rhythm, normal heart sounds and intact distal pulses.   No murmur heard. Pulmonary/Chest: Effort normal and breath  sounds normal. No respiratory distress. He has no wheezes. He has no rales. He exhibits no tenderness.  Abdominal: Soft. He exhibits no distension and no mass. There is no tenderness. There is no rebound and no guarding.  Genitourinary: Rectum normal and penis normal. Rectal exam shows guaiac negative stool.  Genitourinary Comments: Only slight increase in prostate size. No specific nodules palpable.  Musculoskeletal: Normal range of motion. He exhibits no edema or tenderness.  Lymphadenopathy:    He has no cervical adenopathy.  Neurological: He is alert and oriented to person, place, and time. He has normal reflexes. No cranial nerve deficit. He exhibits normal muscle tone. Coordination normal.  Skin: Skin is warm and dry. No rash noted. No erythema.  Psychiatric: He has a normal mood and affect. His behavior is normal. Judgment and thought content normal.    Activities of Daily Living In your present state of health, do you have any difficulty performing the following activities: 01/01/2017  Hearing? Y  Vision? N  Difficulty concentrating or making decisions? N  Walking or climbing stairs? N  Dressing or bathing? N  Doing errands, shopping? N  Some recent data might be hidden    Fall Risk Assessment Fall Risk  01/01/2017  Falls in the past year? No     Depression Screen PHQ 2/9 Scores 01/01/2017 11/11/2015  PHQ - 2 Score 0 0  PHQ- 9 Score 0 -    Cognitive Testing - 6-CIT  Correct? Score   What year is it? yes 0 0 or 4  What month is it? yes 0 0 or 3  Memorize:    Pia Mau,  42,  [address],  [place],      What time is it? (within 1 hour) yes 0 0 or 3  Count backwards from 20 yes 0 0, 2, or 4  Name the months of the year yes 0 0, 2, or 4  Repeat name & address above no 10 0, 2, 4, 6, 8, or 10       TOTAL SCORE  10/28   Interpretation:  Abnormal- 10  Normal (0-7) Abnormal (8-28)      Assessment & Plan:     Initial Preventative Physical Exam  Reviewed patient's  Family Medical History Reviewed and updated list of patient's medical providers Assessment of cognitive impairment was done Assessed patient's functional ability Established a written schedule for health screening [place] Completed and Reviewed  Exercise Activities and Dietary recommendations Goals    Continue to exercise 3-4 days a week and follow low fat diet.      Immunization History  Administered Date(s) Administered  . Pneumococcal Conjugate-13 01/01/2017  . Td 08/06/2001  . Tdap 10/03/2010  . Zoster 11/11/2015    Health Maintenance  Topic Date Due  . INFLUENZA VACCINE  02/27/2017  . PNA vac Low Risk Adult (2 of 2 - PPSV23) 01/01/2018  . TETANUS/TDAP  10/02/2020  . COLONOSCOPY  01/01/2022  . Hepatitis C Screening  Completed  . HIV Screening  Completed     Discussed health benefits of physical activity, and encouraged him to engage in regular exercise appropriate for his age and condition.    ------------------------------------------------------------------------------------------------------------ 1. Welcome to Medicare preventive visit Feeling less stressed since retiring. EKG shows only some first degree AV block without acute changes. Immunizations up to date (given Prevnar-13 today). Has had colonoscopy with normal report. Check routine labs and follow up pending reports.  - EKG 12-Lead - POCT Urinalysis Dipstick - CBC with Differential/Platelet - Lipid panel - TSH - Comprehensive metabolic panel  2. Essential hypertension Continues to get BP in the 120's/80's at home and no longer taking the Prinzide. BP well controlled today. Denies chest pains, palpitations, dyspnea or dizziness. Continue to monitor BP and recheck labs. - CBC with Differential/Platelet - TSH - Comprehensive metabolic panel  3. Hypogonadism in male Still having some decrease in libido. Was not wanting testosterone supplements in the past (especially topicals). Has  changed diet and wants to see if levels are still low. May consider injections if needed. - TSH - Comprehensive metabolic panel - Testosterone  4. BPH with obstruction/lower urinary tract symptoms Some slow down of stream and frequency during the day. Only has nocturia once a night. Recheck labs for prostate cancer screening. - TSH - PSA - Testosterone  5. Mixed hyperlipidemia Tolerating Pravastatin 40 mg qd and trying to exercise with low fat diet. Recheck lipids, TSH and CMP. Continue present dosage and recheck pending reports. - Lipid panel - TSH - Comprehensive metabolic panel    [name], PA  [facility]

## 2017-01-02 LAB — COMPREHENSIVE METABOLIC PANEL
ALK PHOS: 42 IU/L (ref 39–117)
ALT: 22 IU/L (ref 0–44)
AST: 22 IU/L (ref 0–40)
Albumin/Globulin Ratio: 2.4 — ABNORMAL HIGH (ref 1.2–2.2)
Albumin: 4.7 g/dL (ref 3.6–4.8)
BILIRUBIN TOTAL: 0.6 mg/dL (ref 0.0–1.2)
BUN/Creatinine Ratio: 18 (ref 10–24)
BUN: 17 mg/dL (ref 8–27)
CHLORIDE: 107 mmol/L — AB (ref 96–106)
CO2: 22 mmol/L (ref 18–29)
CREATININE: 0.94 mg/dL (ref 0.76–1.27)
Calcium: 9.2 mg/dL (ref 8.6–10.2)
GFR calc Af Amer: 98 mL/min/{1.73_m2} (ref >59)
GFR calc non Af Amer: 85 mL/min/{1.73_m2} (ref >59)
GLUCOSE: 94 mg/dL (ref 65–99)
Globulin, Total: 2 g/dL (ref 1.5–4.5)
Potassium: 4.3 mmol/L (ref 3.5–5.2)
Sodium: 141 mmol/L (ref 134–144)
Total Protein: 6.7 g/dL (ref 6.0–8.5)

## 2017-01-02 LAB — CBC WITH DIFFERENTIAL/PLATELET
BASOS ABS: 0 10*3/uL (ref 0.0–0.2)
Basos: 0 %
EOS (ABSOLUTE): 0.1 10*3/uL (ref 0.0–0.4)
Eos: 2 %
HEMOGLOBIN: 14.7 g/dL (ref 13.0–17.7)
Hematocrit: 42.7 % (ref 37.5–51.0)
Immature Grans (Abs): 0 10*3/uL (ref 0.0–0.1)
Immature Granulocytes: 0 %
LYMPHS ABS: 1.8 10*3/uL (ref 0.7–3.1)
LYMPHS: 37 %
MCH: 30.2 pg (ref 26.6–33.0)
MCHC: 34.4 g/dL (ref 31.5–35.7)
MCV: 88 fL (ref 79–97)
MONOCYTES: 16 %
Monocytes Absolute: 0.8 10*3/uL (ref 0.1–0.9)
Neutrophils Absolute: 2.2 10*3/uL (ref 1.4–7.0)
Neutrophils: 45 %
PLATELETS: 154 10*3/uL (ref 150–379)
RBC: 4.87 x10E6/uL (ref 4.14–5.80)
RDW: 13.5 % (ref 12.3–15.4)
WBC: 4.9 10*3/uL (ref 3.4–10.8)

## 2017-01-02 LAB — TESTOSTERONE: TESTOSTERONE: 320 ng/dL (ref 264–916)

## 2017-01-02 LAB — TSH: TSH: 1.88 u[IU]/mL (ref 0.450–4.500)

## 2017-01-02 LAB — PSA: PROSTATE SPECIFIC AG, SERUM: 1.4 ng/mL (ref 0.0–4.0)

## 2017-01-02 LAB — LIPID PANEL
CHOLESTEROL TOTAL: 190 mg/dL (ref 100–199)
Chol/HDL Ratio: 4.6 ratio (ref 0.0–5.0)
HDL: 41 mg/dL (ref >39)
LDL Calculated: 125 mg/dL — ABNORMAL HIGH (ref 0–99)
TRIGLYCERIDES: 120 mg/dL (ref 0–149)
VLDL Cholesterol Cal: 24 mg/dL (ref 5–40)

## 2017-01-03 ENCOUNTER — Other Ambulatory Visit: Payer: Self-pay | Admitting: Family Medicine

## 2017-01-03 ENCOUNTER — Telehealth: Payer: Self-pay

## 2017-01-03 DIAGNOSIS — R6882 Decreased libido: Secondary | ICD-10-CM

## 2017-01-03 DIAGNOSIS — N401 Enlarged prostate with lower urinary tract symptoms: Secondary | ICD-10-CM

## 2017-01-03 DIAGNOSIS — R3911 Hesitancy of micturition: Principal | ICD-10-CM

## 2017-01-03 NOTE — Telephone Encounter (Signed)
Patient advised. He requested to proceed with Urology referral for Testosterone.

## 2017-01-03 NOTE — Telephone Encounter (Signed)
-----   Message from [name], [place] sent at 01/03/2017  7:51 AM EDT ----- Cholesterol and triglycerides much better. PSA and testosterone normal levels. Continue present diet, Pravastatin and exercise program. Schedule recheck in 5 months.

## 2017-01-03 NOTE — Telephone Encounter (Signed)
LMTCB

## 2017-01-03 NOTE — Telephone Encounter (Signed)
Patient advised. He states he would still like to proceed with referral.

## 2017-01-03 NOTE — Telephone Encounter (Signed)
Will put order in chart to schedule urology referral.

## 2017-01-03 NOTE — Telephone Encounter (Signed)
Testosterone level is normal with the present diet he is on. Can get urology evaluation scheduled if he feels it is needed.

## 2017-01-07 ENCOUNTER — Encounter: Payer: Self-pay | Admitting: Family Medicine

## 2017-01-07 ENCOUNTER — Telehealth: Payer: Self-pay | Admitting: Family Medicine

## 2017-01-07 ENCOUNTER — Other Ambulatory Visit: Payer: Self-pay | Admitting: Family Medicine

## 2017-01-07 DIAGNOSIS — E782 Mixed hyperlipidemia: Secondary | ICD-10-CM

## 2017-01-07 MED ORDER — PRAVASTATIN SODIUM 40 MG PO TABS: 40.0000 mg | ORAL_TABLET | Freq: Every day | ORAL | 3 refills | Status: DC

## 2017-01-07 NOTE — Telephone Encounter (Signed)
[facility] faxed a refill request on the following medications:  pravastatin (PRAVACHOL) 40 MG tablet.  Take 1 tablet by mouth once daily.  90 day supply.  [address]/[facility]

## 2017-01-07 NOTE — Telephone Encounter (Signed)
Sent refills to the [facility].

## 2017-01-28 NOTE — Progress Notes (Signed)
01/29/2017 9:01 AM   Curtis [name] Dec 22, 1950 [id number]  Referring provider: [name], [address] [place], [place] [address]  Chief Complaint  Patient presents with  . Benign Prostatic Hypertrophy    HPI: Patient is a 66 year old Caucasian male who is referred by [name], PA for decreased libido and BPH with LU TS.  Patient has a complaint of low libido.  Recent testosterone level was normal at 320 in 12/2016.  He has been on AndroGel in the past.  He has been reading Dr. [name] materials and wants to start testosterone therapy.    BPH WITH LUTS  (prostate and/or bladder) His IPSS score today is 11, which is moderate lower urinary tract symptomatology.  He is mostly satisfied with his quality life due to his urinary symptoms.  His PVR today is 20 mL.  His previous I PSS score was 6/1.  His previous PVR is 11 mL.    His major complaints today are frequency, intermittency, weak stream, straining to urinate and nocturia x 1.  He has had these symptoms for many years.  He denies any dysuria, hematuria or suprapubic pain.   PSA History  1.6 ng/mL in 12/2015  1.4 ng/mL in 12/2016     He also denies any recent fevers, chills, nausea or vomiting.  He does not have a family history of PCa.      IPSS    Row Name 01/29/17 0800         International Prostate Symptom Score   How often have you had the sensation of not emptying your bladder? Not at All     How often have you had to urinate less than every two hours? About half the time     How often have you found you stopped and started again several times when you urinated? Less than half the time     How often have you found it difficult to postpone urination? Not at All     How often have you had a weak urinary stream? More than half the time     How often have you had to strain to start urination? Less than 1 in 5 times     How many times did you typically get up at night to urinate? 1 Time       Total IPSS Score 11       Quality of Life due to urinary symptoms   If you were to spend the rest of your life with your urinary condition just the way it is now how would you feel about that? Mostly Satisfied        Score:  1-7 Mild 8-19 Moderate 20-35 Severe  Erectile dysfunction (penis) His SHIM score is 10, which is moderate ED.  His previous SHIM score was 10.   He has been having difficulty with erections for several.   His major complaint is lack of firmness.  His libido is diminished.   His risk factors for ED are age, BPH, HTN and HLD.  TSH was 1.88 in 12/2016.   He denies any painful erections or curvatures with his erections.   He is still having/no longer having spontaneous erections.  He has tried PDE5-inhibitors in the past, but he could not tolerate the side effects.         SHIM    Row Name 01/29/17 0840         SHIM: Over the last 6 months:  How do you rate your confidence that you could get and keep an erection? Very Low     When you had erections with sexual stimulation, how often were your erections hard enough for penetration (entering your partner)? A Few Times (much less than half the time)     During sexual intercourse, how often were you able to maintain your erection after you had penetrated (entered) your partner? A Few Times (much less than half the time)     During sexual intercourse, how difficult was it to maintain your erection to completion of intercourse? Very Difficult     When you attempted sexual intercourse, how often was it satisfactory for you? Sometimes (about half the time)       SHIM Total Score   SHIM 10        Score: 1-7 Severe ED 8-11 Moderate ED 12-16 Mild-Moderate ED 17-21 Mild ED 22-25 No ED       PMH: Past Medical History:  Diagnosis Date  . HTN (hypertension)   . Hyperlipidemia   . Hypogonadism in male   . Low back pain     Surgical History: Past Surgical History:  Procedure Laterality Date  . ACHILLES  TENDON SURGERY Right 1995  . HERNIA REPAIR  2002   double  . Surgery on Left pinky in 2000      Home Medications:  Allergies as of 01/29/2017   No Known Allergies     Medication List       Accurate as of 01/29/17  9:01 AM. Always use your most recent med list.          pravastatin 40 MG tablet Commonly known as:  PRAVACHOL Take 1 tablet (40 mg total) by mouth daily.       Allergies: No Known Allergies  Family History: Family History  Problem Relation Age of Onset  . Hypertension Mother   . Arthritis Mother   . Vision loss Mother        due to age  . Cancer Father        in the liver    Social History:  reports that he has never smoked. He has never used smokeless tobacco. He reports that he drinks about 1.2 oz of alcohol per week . He reports that he does not use drugs.  ROS: UROLOGY Frequent Urination?: No Hard to postpone urination?: No Burning/pain with urination?: No Get up at night to urinate?: No Leakage of urine?: No Urine stream starts and stops?: No Trouble starting stream?: No Do you have to strain to urinate?: No Blood in urine?: No Urinary tract infection?: No Sexually transmitted disease?: No Injury to kidneys or bladder?: No Painful intercourse?: No Weak stream?: No Erection problems?: Yes Penile pain?: No  Gastrointestinal Nausea?: No Vomiting?: No Indigestion/heartburn?: No Diarrhea?: No Constipation?: No  Constitutional Fever: No Night sweats?: No Weight loss?: No Fatigue?: No  Skin Skin rash/lesions?: No Itching?: No  Eyes Blurred vision?: No Double vision?: No  Ears/Nose/Throat Sore throat?: No Sinus problems?: No  Hematologic/Lymphatic Swollen glands?: No Easy bruising?: No  Cardiovascular Leg swelling?: No Chest pain?: No  Respiratory Cough?: No Shortness of breath?: No  Endocrine Excessive thirst?: No  Musculoskeletal Back pain?: Yes Joint pain?: No  Neurological Headaches?: No Dizziness?:  No  Psychologic Depression?: No Anxiety?: No  Physical Exam: BP (!) 188/71   Pulse 67   Ht 5\' 8"  (1.727 m)   Wt 200 lb (90.7 kg)   BMI 30.41 kg/m  Constitutional: Well nourished. Alert and oriented, No acute distress. HEENT: [place] AT, moist mucus membranes. Trachea midline, no masses. Cardiovascular: No clubbing, cyanosis, or edema. Respiratory: Normal respiratory effort, no increased work of breathing. GI: Abdomen is soft, non tender, non distended, no abdominal masses. Liver and spleen not palpable.  No hernias appreciated.  Stool sample for occult testing is not indicated.   GU: No CVA tenderness.  No bladder fullness or masses.  Patient with circumcised phallus.  Urethral meatus is patent.  No penile discharge. No penile lesions or rashes. Scrotum without lesions, cysts, rashes and/or edema.  Testicles are located scrotally bilaterally. No masses are appreciated in the testicles. Left and right epididymis are normal. Rectal: Patient with  normal sphincter tone. Anus and perineum without scarring or rashes. No rectal masses are appreciated. Prostate is approximately  50 grams, no nodules are appreciated. Seminal vesicles are normal. Skin: No rashes, bruises or suspicious lesions. Lymph: No cervical or inguinal adenopathy. Neurologic: Grossly intact, no focal deficits, moving all 4 extremities. Psychiatric: Normal mood and affect.  Laboratory Data: Lab Results  Component Value Date   WBC 4.9 01/01/2017   HGB 14.7 01/01/2017   HCT 42.7 01/01/2017   MCV 88 01/01/2017   PLT 154 01/01/2017    Lab Results  Component Value Date   CREATININE 0.94 01/01/2017    Lab Results  Component Value Date   TESTOSTERONE 320 01/01/2017     Lab Results  Component Value Date   TSH 1.880 01/01/2017       Component Value Date/Time   CHOL 190 01/01/2017 1031   HDL 41 01/01/2017 1031   CHOLHDL 4.6 01/01/2017 1031   LDLCALC 125 (H) 01/01/2017 1031    Lab Results  Component Value  Date   AST 22 01/01/2017   Lab Results  Component Value Date   ALT 22 01/01/2017    I have independently reviewed the labs.  Pertinent Imaging: Results for JSIAH, MENTA (MRN [id number]) as of 01/29/2017 09:00  Ref. Range 01/29/2017 08:55  Scan Result Unknown 66ml   I have independently reviewed the films.    Assessment & Plan:    1. Decreased libido  - testosterone levels are within the normal range at this time  - he is wanting to repeat the levels in the morning - he will RTC before 9 AM for blood draw  2. BPH with LUTS  - IPSS score is 11/2, it is worsening  - Continue conservative management, avoiding bladder irritants and timed voiding's  - most bothersome symptoms is/are weak stream  - discussed side effects of alpha blockers and alpha reductase inhibitor - he does not want to start medication at this time  3. Erectile dysfunction  - SHIM score is 10, it is stable  - patient is currently following Dr. [name] plan which is a diet plan that encourages good healthy food and exercises he has lost weight, lowered his blood pressure and cholesterol level with this regimen  - We discussed trying a different PDE5 inhibitor - he is not interested in trying another medication at this time    - RTC in AM for testosterone level only    Return for RTC in the AM before 9 AM for testosterone level only.  These notes generated with voice recognition software. I apologize for typographical errors.  Zara Council, [facility] [address], [place], [place] [address] [phone]

## 2017-01-29 ENCOUNTER — Encounter: Payer: Self-pay | Admitting: Urology

## 2017-01-29 ENCOUNTER — Ambulatory Visit (INDEPENDENT_AMBULATORY_CARE_PROVIDER_SITE_OTHER): Payer: Medicare Other | Admitting: Urology

## 2017-01-29 VITALS — BP 188/71 | HR 67 | Ht 68.0 in | Wt 200.0 lb

## 2017-01-29 DIAGNOSIS — N401 Enlarged prostate with lower urinary tract symptoms: Secondary | ICD-10-CM | POA: Diagnosis not present

## 2017-01-29 DIAGNOSIS — R6882 Decreased libido: Secondary | ICD-10-CM | POA: Diagnosis not present

## 2017-01-29 DIAGNOSIS — N138 Other obstructive and reflux uropathy: Secondary | ICD-10-CM | POA: Diagnosis not present

## 2017-01-29 DIAGNOSIS — N529 Male erectile dysfunction, unspecified: Secondary | ICD-10-CM | POA: Diagnosis not present

## 2017-01-29 LAB — BLADDER SCAN AMB NON-IMAGING

## 2017-01-31 ENCOUNTER — Other Ambulatory Visit: Payer: Medicare Other

## 2017-01-31 DIAGNOSIS — E291 Testicular hypofunction: Secondary | ICD-10-CM

## 2017-02-01 ENCOUNTER — Telehealth: Payer: Self-pay

## 2017-02-01 DIAGNOSIS — E291 Testicular hypofunction: Secondary | ICD-10-CM

## 2017-02-01 LAB — TESTOSTERONE: TESTOSTERONE: 228 ng/dL — AB (ref 264–916)

## 2017-02-01 NOTE — Telephone Encounter (Signed)
Patient notified and scheduled for lab, order placed

## 2017-02-01 NOTE — Telephone Encounter (Signed)
-----   Message from Nori Riis, PA-C sent at 02/01/2017  8:09 AM EDT ----- Please let Mr. Loe know that his testosterone level returned below normal.  We will need to repeat another testosterone before 9 AM to confirm testosterone deficiency.

## 2017-02-04 ENCOUNTER — Other Ambulatory Visit: Payer: Medicare Other

## 2017-02-04 DIAGNOSIS — E291 Testicular hypofunction: Secondary | ICD-10-CM | POA: Diagnosis not present

## 2017-02-05 LAB — TESTOSTERONE: Testosterone: 262 ng/dL — ABNORMAL LOW (ref 264–916)

## 2017-02-11 ENCOUNTER — Other Ambulatory Visit: Payer: Medicare Other

## 2017-02-11 ENCOUNTER — Telehealth: Payer: Self-pay | Admitting: *Deleted

## 2017-02-11 DIAGNOSIS — E291 Testicular hypofunction: Secondary | ICD-10-CM | POA: Diagnosis not present

## 2017-02-11 NOTE — Telephone Encounter (Signed)
Spoke with patient and gave results. Patient ok with plan added to lab schedule for today. Future orders placed.

## 2017-02-11 NOTE — Telephone Encounter (Signed)
-----  Message from Nori Riis, PA-C sent at 02/10/2017  8:20 PM EDT ----- Please let Curtis [name] know that he has met the criteria for testosterone deficiency.  We will need to obtain a LH/FSH and prolactin level.

## 2017-02-12 ENCOUNTER — Telehealth: Payer: Self-pay

## 2017-02-12 LAB — PROLACTIN: Prolactin: 5.4 ng/mL (ref 4.0–15.2)

## 2017-02-12 LAB — FSH/LH
FSH: 11 m[IU]/mL (ref 1.5–12.4)
LH: 6.3 m[IU]/mL (ref 1.7–8.6)

## 2017-02-12 NOTE — Telephone Encounter (Signed)
Gave patient results. Patient verbalized understanding. Patient states he is interested in taking injections instead.

## 2017-02-12 NOTE — Telephone Encounter (Signed)
-----   Message from Nori Riis, PA-C sent at 02/12/2017  8:00 AM EDT ----- Please let [name] know that his additional blood work is normal.  We can go ahead and start testosterone therapy.  I typically start men on Clomid as it has less side effects and stimulates the body's natural testosterone levels.  If he would like to try that first, please send a script for Clomid 50 mg, 1/2 tablet daily, # 30 to his pharmacy.  We will need to recheck his testosterone in one month before 10 AM.

## 2017-02-12 NOTE — Telephone Encounter (Signed)
That is fine.  We can call in a prescription for testosterone injections for the patient.  He will need to receive his injections at our office.  We will start will 1 cc per every two weeks.  We will need to check a testosterone level after his fourth injection.

## 2017-02-13 MED ORDER — TESTOSTERONE CYPIONATE 200 MG/ML IM SOLN: 200.0000 mg | INTRAMUSCULAR | 0 refills | Status: DC

## 2017-02-13 NOTE — Telephone Encounter (Signed)
Spoke to patient. Gave instructions per Shannon's previous notes. Patient verbalized understanding. Sent Rx to verified pharmacy on file.

## 2017-02-13 NOTE — Telephone Encounter (Signed)
This encounter was created in error - please disregard.

## 2017-02-18 ENCOUNTER — Ambulatory Visit (INDEPENDENT_AMBULATORY_CARE_PROVIDER_SITE_OTHER): Payer: Medicare Other

## 2017-02-18 DIAGNOSIS — E291 Testicular hypofunction: Secondary | ICD-10-CM

## 2017-02-18 MED ORDER — TESTOSTERONE CYPIONATE 200 MG/ML IM SOLN
200.0000 mg | Freq: Once | INTRAMUSCULAR | Status: AC
  Administered 2017-02-18: 200 mg via INTRAMUSCULAR

## 2017-02-18 NOTE — Progress Notes (Signed)
Testosterone IM Injection  Due to Hypogonadism patient is present today for a Testosterone Injection.  Medication: Testosterone Cypionate Dose: 1 ml Location: right upper outer buttocks Lot: [id number] Exp:07/2018  Patient tolerated well, no complications were noted, Pt was monitored for 30 mins after injection due to being 1st injection.  Preformed by: C.Lowe,CMA  Follow up: 2 weeks

## 2017-03-06 ENCOUNTER — Ambulatory Visit (INDEPENDENT_AMBULATORY_CARE_PROVIDER_SITE_OTHER): Payer: Medicare Other

## 2017-03-06 DIAGNOSIS — E291 Testicular hypofunction: Secondary | ICD-10-CM | POA: Diagnosis not present

## 2017-03-06 MED ORDER — TESTOSTERONE CYPIONATE 200 MG/ML IM SOLN
200.0000 mg | Freq: Once | INTRAMUSCULAR | Status: AC
  Administered 2017-03-06: 200 mg via INTRAMUSCULAR

## 2017-03-06 NOTE — Progress Notes (Signed)
Testosterone IM Injection  Due to Hypogonadism patient is present today for a Testosterone Injection.  Medication: Testosterone Cypionate Dose: 1 ml Location: left upper outer buttocks Lot: [id number] Exp:07/2018  Patient tolerated well, no complications were noted  Preformed by: C. Corinna Capra, CMA   Follow up: 2 weeks

## 2017-03-22 ENCOUNTER — Ambulatory Visit (INDEPENDENT_AMBULATORY_CARE_PROVIDER_SITE_OTHER): Payer: Medicare Other

## 2017-03-22 DIAGNOSIS — E291 Testicular hypofunction: Secondary | ICD-10-CM | POA: Diagnosis not present

## 2017-03-22 MED ORDER — TESTOSTERONE CYPIONATE 200 MG/ML IM SOLN
200.0000 mg | Freq: Once | INTRAMUSCULAR | Status: AC
  Administered 2017-03-22: 200 mg via INTRAMUSCULAR

## 2017-03-22 NOTE — Progress Notes (Signed)
Testosterone IM Injection  Due to Hypogonadism patient is present today for a Testosterone Injection.  Medication: Testosterone Cypionate Dose: 36mL Location: right upper outer buttocks Lot: [id number] Exp:07/2018  Patient tolerated well, no complications were noted  Preformed by: Toniann Fail, LPN   Follow up: 2 weeks. Pt will need labs after next injection.

## 2017-04-02 ENCOUNTER — Ambulatory Visit (INDEPENDENT_AMBULATORY_CARE_PROVIDER_SITE_OTHER): Payer: Medicare Other

## 2017-04-02 DIAGNOSIS — E291 Testicular hypofunction: Secondary | ICD-10-CM | POA: Diagnosis not present

## 2017-04-02 MED ORDER — TESTOSTERONE CYPIONATE 200 MG/ML IM SOLN
200.0000 mg | Freq: Once | INTRAMUSCULAR | Status: AC
  Administered 2017-04-02: 200 mg via INTRAMUSCULAR

## 2017-04-02 NOTE — Progress Notes (Signed)
Testosterone IM Injection  Due to Hypogonadism patient is present today for a Testosterone Injection.  Medication: Testosterone Cypionate Dose: 66mL Location: left upper outer buttocks Lot: [id number] Exp:01/20  Patient tolerated well, no complications were noted  Preformed by: Toniann Fail, LPN   Follow up: pt will have labs drawn next week and injection in 2 weeks.

## 2017-04-05 DIAGNOSIS — H2513 Age-related nuclear cataract, bilateral: Secondary | ICD-10-CM | POA: Diagnosis not present

## 2017-04-09 ENCOUNTER — Other Ambulatory Visit: Payer: Medicare Other

## 2017-04-09 DIAGNOSIS — E291 Testicular hypofunction: Secondary | ICD-10-CM | POA: Diagnosis not present

## 2017-04-10 ENCOUNTER — Telehealth: Payer: Self-pay

## 2017-04-10 LAB — TESTOSTERONE: TESTOSTERONE: 835 ng/dL (ref 264–916)

## 2017-04-10 NOTE — Telephone Encounter (Signed)
-----   Message from Nori Riis, PA-C sent at 04/10/2017  7:50 AM EDT ----- Please notify [name] that his testosterone levels are now therapeutic. I would continue on present dose and injection schedule. I will need to see him in 6 months for an office visit. He will need a testosterone drawn 1 week after his injection, hemoglobin, hematocrit and PSA prior to that appointment.   I would like a testosterone drawn 1 week after his injection, hemoglobin and hematocrit in 3 months.

## 2017-04-10 NOTE — Telephone Encounter (Signed)
Left mess 

## 2017-04-15 ENCOUNTER — Other Ambulatory Visit: Payer: Self-pay

## 2017-04-15 ENCOUNTER — Ambulatory Visit (INDEPENDENT_AMBULATORY_CARE_PROVIDER_SITE_OTHER): Payer: Medicare Other

## 2017-04-15 DIAGNOSIS — E291 Testicular hypofunction: Secondary | ICD-10-CM

## 2017-04-15 MED ORDER — TESTOSTERONE CYPIONATE 200 MG/ML IM SOLN
200.0000 mg | Freq: Once | INTRAMUSCULAR | Status: AC
  Administered 2017-04-15: 200 mg via INTRAMUSCULAR

## 2017-04-15 NOTE — Progress Notes (Signed)
Testosterone IM Injection  Due to Hypogonadism patient is present today for a Testosterone Injection.  Medication: Testosterone Cypionate Dose: 17mL Location: right upper outer buttocks Lot: [id number] Exp:07/2018  Patient tolerated well, no complications were noted  Preformed by: Toniann Fail, LPN   Follow up: 2 weeks

## 2017-04-29 ENCOUNTER — Ambulatory Visit (INDEPENDENT_AMBULATORY_CARE_PROVIDER_SITE_OTHER): Payer: Medicare Other

## 2017-04-29 DIAGNOSIS — E291 Testicular hypofunction: Secondary | ICD-10-CM | POA: Diagnosis not present

## 2017-04-29 MED ORDER — TESTOSTERONE CYPIONATE 200 MG/ML IM SOLN
200.0000 mg | Freq: Once | INTRAMUSCULAR | Status: AC
  Administered 2017-04-29: 200 mg via INTRAMUSCULAR

## 2017-04-29 NOTE — Progress Notes (Signed)
Testosterone IM Injection  Due to Hypogonadism patient is present today for a Testosterone Injection.  Medication: Testosterone Cypionate Dose: 4mL Location: left upper outer buttocks Lot: [id number] Exp:07/2018  Patient tolerated well, no complications were noted  Preformed by: Toniann Fail, LPN   Follow up: 2 weeks-Pt is out of medication. Pt will call if needs refill.

## 2017-04-30 ENCOUNTER — Other Ambulatory Visit: Payer: Self-pay

## 2017-04-30 DIAGNOSIS — E291 Testicular hypofunction: Secondary | ICD-10-CM

## 2017-04-30 MED ORDER — TESTOSTERONE CYPIONATE 200 MG/ML IM SOLN: 200.0000 mg | INTRAMUSCULAR | 0 refills | Status: DC

## 2017-05-07 DIAGNOSIS — Z23 Encounter for immunization: Secondary | ICD-10-CM | POA: Diagnosis not present

## 2017-05-13 ENCOUNTER — Encounter: Payer: Self-pay | Admitting: Family Medicine

## 2017-05-13 ENCOUNTER — Ambulatory Visit (INDEPENDENT_AMBULATORY_CARE_PROVIDER_SITE_OTHER): Payer: Medicare Other

## 2017-05-13 DIAGNOSIS — E291 Testicular hypofunction: Secondary | ICD-10-CM

## 2017-05-13 MED ORDER — TESTOSTERONE CYPIONATE 200 MG/ML IM SOLN
200.0000 mg | Freq: Once | INTRAMUSCULAR | Status: AC
  Administered 2017-05-13: 200 mg via INTRAMUSCULAR

## 2017-05-13 NOTE — Progress Notes (Signed)
Testosterone IM Injection  Due to Hypogonadism patient is present today for a Testosterone Injection.  Medication: Testosterone Cypionate Dose: 25ml Location: right upper outer buttocks Lot: [id number] Exp:05/20  Patient tolerated well, no complications were noted  Preformed by: C. Corinna Capra, CMA     Follow up: 2 weeks

## 2017-05-27 ENCOUNTER — Ambulatory Visit (INDEPENDENT_AMBULATORY_CARE_PROVIDER_SITE_OTHER): Payer: Medicare Other

## 2017-05-27 DIAGNOSIS — E291 Testicular hypofunction: Secondary | ICD-10-CM

## 2017-05-27 MED ORDER — TESTOSTERONE CYPIONATE 200 MG/ML IM SOLN
200.0000 mg | Freq: Once | INTRAMUSCULAR | Status: AC
  Administered 2017-05-27: 200 mg via INTRAMUSCULAR

## 2017-05-27 NOTE — Progress Notes (Signed)
Testosterone IM Injection  Due to Hypogonadism patient is present today for a Testosterone Injection.  Medication: Testosterone Cypionate Dose: 1 ml Location: left upper outer buttocks Lot: [id number] Exp:05/20  Patient tolerated well, no complications were noted  Preformed by: C. Corinna Capra, CMA  Follow up: 2 weeks

## 2017-06-10 ENCOUNTER — Ambulatory Visit (INDEPENDENT_AMBULATORY_CARE_PROVIDER_SITE_OTHER): Payer: Medicare Other

## 2017-06-10 DIAGNOSIS — E291 Testicular hypofunction: Secondary | ICD-10-CM | POA: Diagnosis not present

## 2017-06-10 MED ORDER — TESTOSTERONE CYPIONATE 200 MG/ML IM SOLN
200.0000 mg | Freq: Once | INTRAMUSCULAR | Status: AC
  Administered 2017-06-10: 200 mg via INTRAMUSCULAR

## 2017-06-10 NOTE — Progress Notes (Signed)
Testosterone IM Injection  Due to Hypogonadism patient is present today for a Testosterone Injection.  Medication: Testosterone Cypionate Dose: 38ml  Location: right upper outer buttocks Lot: [id number] Exp:05/20  Patient tolerated well, no complications were noted  Preformed by: C. Corinna Capra, CMA  Follow up: 2 weeks

## 2017-06-11 ENCOUNTER — Ambulatory Visit: Payer: Medicare Other

## 2017-06-25 ENCOUNTER — Ambulatory Visit: Payer: Medicare Other

## 2017-06-26 ENCOUNTER — Ambulatory Visit: Payer: Medicare Other

## 2017-06-28 ENCOUNTER — Ambulatory Visit (INDEPENDENT_AMBULATORY_CARE_PROVIDER_SITE_OTHER): Payer: Medicare Other

## 2017-06-28 DIAGNOSIS — E291 Testicular hypofunction: Secondary | ICD-10-CM | POA: Diagnosis not present

## 2017-06-28 MED ORDER — TESTOSTERONE CYPIONATE 200 MG/ML IM SOLN
200.0000 mg | Freq: Once | INTRAMUSCULAR | Status: AC
  Administered 2017-06-28: 200 mg via INTRAMUSCULAR

## 2017-06-28 NOTE — Progress Notes (Signed)
Testosterone IM Injection  Due to Hypogonadism patient is present today for a Testosterone Injection.  Medication: Testosterone Cypionate Dose: 7mL Location: right upper outer buttocks Lot: [id number] Exp:11/2018  Patient tolerated well, no complications were noted  Preformed by: Toniann Fail, LPN   Follow up: 2 weeks. Pt is out of medication. Lab appts and OV appts made.

## 2017-07-04 ENCOUNTER — Other Ambulatory Visit: Payer: Medicare Other

## 2017-07-04 DIAGNOSIS — E291 Testicular hypofunction: Secondary | ICD-10-CM

## 2017-07-05 ENCOUNTER — Encounter: Payer: Self-pay | Admitting: Urology

## 2017-07-05 ENCOUNTER — Telehealth: Payer: Self-pay

## 2017-07-05 LAB — TESTOSTERONE: Testosterone: 1112 ng/dL — ABNORMAL HIGH (ref 264–916)

## 2017-07-05 LAB — HEMATOCRIT: HEMATOCRIT: 46.7 % (ref 37.5–51.0)

## 2017-07-05 LAB — HEMOGLOBIN: Hemoglobin: 15.6 g/dL (ref 13.0–17.7)

## 2017-07-05 NOTE — Telephone Encounter (Signed)
Yes

## 2017-07-05 NOTE — Telephone Encounter (Signed)
Pt needs a refill of testosterone. Can we send that in?

## 2017-07-05 NOTE — Telephone Encounter (Signed)
-----   Message from Nori Riis, PA-C sent at 07/05/2017  7:43 AM EST ----- Patient's testosterone level is high.  He will need to have his dose reduced to 0.5 cc every two weeks and then recheck his testosterone level one week after his fourth injection.

## 2017-07-09 ENCOUNTER — Ambulatory Visit: Payer: Medicare Other

## 2017-07-09 ENCOUNTER — Telehealth: Payer: Self-pay

## 2017-07-09 NOTE — Telephone Encounter (Signed)
Script for testosterone called into patient's [facility]

## 2017-07-10 ENCOUNTER — Ambulatory Visit (INDEPENDENT_AMBULATORY_CARE_PROVIDER_SITE_OTHER): Payer: Medicare Other

## 2017-07-10 DIAGNOSIS — E291 Testicular hypofunction: Secondary | ICD-10-CM | POA: Diagnosis not present

## 2017-07-10 MED ORDER — TESTOSTERONE CYPIONATE 200 MG/ML IM SOLN
200.0000 mg | Freq: Once | INTRAMUSCULAR | Status: AC
  Administered 2017-07-10: 200 mg via INTRAMUSCULAR

## 2017-07-10 NOTE — Telephone Encounter (Signed)
Medication refilled

## 2017-07-10 NOTE — Progress Notes (Signed)
Testosterone IM Injection  Due to Hypogonadism patient is present today for a Testosterone Injection.  Medication: Testosterone Cypionate Dose: 0.57mL Location: left upper outer buttocks Lot: [id number] Exp:10/2018  Patient tolerated well, no complications were noted  Preformed by: Toniann Fail, LPN   Follow up: 2 weeks

## 2017-07-15 ENCOUNTER — Other Ambulatory Visit: Payer: Medicare Other

## 2017-07-26 ENCOUNTER — Ambulatory Visit: Payer: Medicare Other

## 2017-07-26 ENCOUNTER — Ambulatory Visit (INDEPENDENT_AMBULATORY_CARE_PROVIDER_SITE_OTHER): Payer: Medicare Other

## 2017-07-26 DIAGNOSIS — E291 Testicular hypofunction: Secondary | ICD-10-CM | POA: Diagnosis not present

## 2017-07-26 MED ORDER — TESTOSTERONE CYPIONATE 200 MG/ML IM SOLN
200.0000 mg | Freq: Once | INTRAMUSCULAR | Status: AC
  Administered 2017-07-26: 200 mg via INTRAMUSCULAR

## 2017-07-26 NOTE — Progress Notes (Addendum)
Testosterone IM Injection  Due to Hypogonadism patient is present today for a Testosterone Injection.  Medication: Testosterone Cypionate Dose: 0.23mL Location: right upper outer buttocks Lot: [id number] Exp:10/2018  Patient tolerated well, no complications were noted  Preformed by: Toniann Fail, LPN   Follow up: 2 weeks

## 2017-08-05 ENCOUNTER — Ambulatory Visit (INDEPENDENT_AMBULATORY_CARE_PROVIDER_SITE_OTHER): Payer: Medicare Other

## 2017-08-05 DIAGNOSIS — E291 Testicular hypofunction: Secondary | ICD-10-CM

## 2017-08-05 MED ORDER — TESTOSTERONE CYPIONATE 200 MG/ML IM SOLN
200.0000 mg | Freq: Once | INTRAMUSCULAR | Status: AC
  Administered 2017-08-05: 200 mg via INTRAMUSCULAR

## 2017-08-05 NOTE — Progress Notes (Signed)
Testosterone IM Injection  Due to Hypogonadism patient is present today for a Testosterone Injection.  Medication: Testosterone Cypionate Dose: 0.53mL Location: left upper outer buttocks Lot: [id number] Exp:10/2018  Patient tolerated well, no complications were noted  Preformed by: Toniann Fail, LPN   Follow up: 2 weeks

## 2017-08-19 ENCOUNTER — Ambulatory Visit (INDEPENDENT_AMBULATORY_CARE_PROVIDER_SITE_OTHER): Payer: Medicare Other | Admitting: Family Medicine

## 2017-08-19 DIAGNOSIS — E291 Testicular hypofunction: Secondary | ICD-10-CM | POA: Diagnosis not present

## 2017-08-19 MED ORDER — TESTOSTERONE CYPIONATE 200 MG/ML IM SOLN
200.0000 mg | Freq: Once | INTRAMUSCULAR | Status: AC
  Administered 2017-08-19: 200 mg via INTRAMUSCULAR

## 2017-08-19 NOTE — Progress Notes (Signed)
Testosterone IM Injection  Due to Hypogonadism patient is present today for a Testosterone Injection.  Medication: Testosterone Cypionate Dose: 0.41ml Location: right upper outer buttocks Lot: [id number] Exp:10/2018  Patient tolerated well, no complications were noted  Preformed by: Elberta Leatherwood, CMA  Follow up: 2 weeks

## 2017-08-26 ENCOUNTER — Other Ambulatory Visit: Payer: Medicare Other

## 2017-08-26 DIAGNOSIS — E291 Testicular hypofunction: Secondary | ICD-10-CM

## 2017-08-27 LAB — TESTOSTERONE: Testosterone: 488 ng/dL (ref 264–916)

## 2017-09-02 ENCOUNTER — Ambulatory Visit (INDEPENDENT_AMBULATORY_CARE_PROVIDER_SITE_OTHER): Payer: Medicare Other

## 2017-09-02 DIAGNOSIS — E291 Testicular hypofunction: Secondary | ICD-10-CM

## 2017-09-02 MED ORDER — TESTOSTERONE CYPIONATE 200 MG/ML IM SOLN
200.0000 mg | Freq: Once | INTRAMUSCULAR | Status: AC
  Administered 2017-09-02: 200 mg via INTRAMUSCULAR

## 2017-09-02 NOTE — Progress Notes (Signed)
Testosterone IM Injection  Due to Hypogonadism patient is present today for a Testosterone Injection.  Medication: Testosterone Cypionate Dose: 0.70mL Location: right upper outer buttocks Lot: [id number] Exp:10/2018  Patient tolerated well, no complications were noted  Preformed by: Toniann Fail, LPN   Follow up: 2 weeks

## 2017-09-09 ENCOUNTER — Other Ambulatory Visit: Payer: Self-pay | Admitting: Family Medicine

## 2017-09-09 ENCOUNTER — Telehealth: Payer: Self-pay

## 2017-09-09 NOTE — Telephone Encounter (Signed)
Please schedule and notify patient of dates and times thanks

## 2017-09-09 NOTE — Telephone Encounter (Signed)
-----   Message from Nori Riis, PA-C sent at 09/09/2017 12:35 PM EST ----- Patient needs an office visit.  He will need a PSA, HCT, Hbg and PSA prior to the appointment.

## 2017-09-10 NOTE — Telephone Encounter (Signed)
Patient has already been scheduled  Curtis [name]

## 2017-09-16 ENCOUNTER — Telehealth: Payer: Self-pay

## 2017-09-16 ENCOUNTER — Ambulatory Visit (INDEPENDENT_AMBULATORY_CARE_PROVIDER_SITE_OTHER): Payer: Medicare Other

## 2017-09-16 DIAGNOSIS — E291 Testicular hypofunction: Secondary | ICD-10-CM

## 2017-09-16 DIAGNOSIS — E349 Endocrine disorder, unspecified: Secondary | ICD-10-CM

## 2017-09-16 MED ORDER — TESTOSTERONE CYPIONATE 200 MG/ML IM SOLN
200.0000 mg | Freq: Once | INTRAMUSCULAR | Status: AC
Start: 2017-09-16 — End: 2017-09-16
  Administered 2017-09-16: 200 mg via INTRAMUSCULAR

## 2017-09-16 MED ORDER — TESTOSTERONE CYPIONATE 200 MG/ML IM SOLN
200.0000 mg | INTRAMUSCULAR | 0 refills | Status: DC
Start: 2017-09-16 — End: 2018-06-11

## 2017-09-16 NOTE — Progress Notes (Signed)
Testosterone IM Injection  Due to Hypogonadism patient is present today for a Testosterone Injection.  Medication: Testosterone Cypionate Dose: 0.28mL Location: left upper outer buttocks Lot: [id number] Exp:10/2018  Patient tolerated well, no complications were noted  Preformed by: Toniann Fail, LPN   Follow up: 2 weeks. Pt is currently out of meds also. Pt voiced understanding.

## 2017-09-16 NOTE — Telephone Encounter (Signed)
That will be fine to refill his testosterone cypionate.

## 2017-09-16 NOTE — Telephone Encounter (Signed)
Pt came in today for testosterone injection. Today his last vial was given. Pt had labs on 08/26/17. He has another lab appt at the end of March and f/u with you 10/28/17. Can he have a refill on meds?

## 2017-09-25 ENCOUNTER — Encounter: Payer: Self-pay | Admitting: Urology

## 2017-09-26 ENCOUNTER — Telehealth: Payer: Self-pay | Admitting: Urology

## 2017-09-26 NOTE — Telephone Encounter (Signed)
Pt called office LMOM asking to have someone call him and his pharmacist to clarify Rx dosage. Pt did not specify medication, just that there is some confusion for both the pt and the pharmacy and both asking for clarification.  Please Advise. Thanks.

## 2017-09-26 NOTE — Telephone Encounter (Signed)
Attempted to call pt and was unsuccessful. Will send a mychart message.

## 2017-09-30 ENCOUNTER — Ambulatory Visit (INDEPENDENT_AMBULATORY_CARE_PROVIDER_SITE_OTHER): Payer: Medicare Other

## 2017-09-30 DIAGNOSIS — E291 Testicular hypofunction: Secondary | ICD-10-CM | POA: Diagnosis not present

## 2017-09-30 MED ORDER — TESTOSTERONE CYPIONATE 200 MG/ML IM SOLN
200.0000 mg | Freq: Once | INTRAMUSCULAR | Status: AC
  Administered 2017-09-30: 200 mg via INTRAMUSCULAR

## 2017-09-30 NOTE — Progress Notes (Signed)
Testosterone IM Injection  Due to Hypogonadism patient is present today for a Testosterone Injection.  Medication: Testosterone Cypionate Dose: 0.73mL Location: right upper outer buttocks Lot: [id number] Exp:04/2019  Patient tolerated well, no complications were noted  Preformed by: Toniann Fail, LPN   Follow up: 2 weeks

## 2017-10-14 ENCOUNTER — Ambulatory Visit (INDEPENDENT_AMBULATORY_CARE_PROVIDER_SITE_OTHER): Payer: Medicare Other

## 2017-10-14 DIAGNOSIS — E291 Testicular hypofunction: Secondary | ICD-10-CM

## 2017-10-14 MED ORDER — TESTOSTERONE CYPIONATE 200 MG/ML IM SOLN
200.0000 mg | Freq: Once | INTRAMUSCULAR | Status: AC
  Administered 2017-10-14: 200 mg via INTRAMUSCULAR

## 2017-10-14 NOTE — Progress Notes (Signed)
Testosterone IM Injection  Due to Hypogonadism patient is present today for a Testosterone Injection.  Medication: Testosterone Cypionate Dose: 0.10mL Location: left upper outer buttocks Lot: [id number] Exp:04/2019  Patient tolerated well, no complications were noted  Preformed by: Toniann Fail, LPN   Follow up: 2 weeks

## 2017-10-16 ENCOUNTER — Telehealth: Payer: Self-pay

## 2017-10-16 NOTE — Telephone Encounter (Signed)
LMTCB and schedule AWV.  -MM 

## 2017-10-21 ENCOUNTER — Other Ambulatory Visit: Payer: Medicare Other

## 2017-10-21 ENCOUNTER — Other Ambulatory Visit: Payer: Self-pay

## 2017-10-21 DIAGNOSIS — E291 Testicular hypofunction: Secondary | ICD-10-CM | POA: Diagnosis not present

## 2017-10-22 LAB — HEMATOCRIT: Hematocrit: 44.4 % (ref 37.5–51.0)

## 2017-10-22 LAB — TESTOSTERONE: TESTOSTERONE: 672 ng/dL (ref 264–916)

## 2017-10-22 LAB — HEMOGLOBIN: Hemoglobin: 15.4 g/dL (ref 13.0–17.7)

## 2017-10-25 NOTE — Progress Notes (Signed)
10/28/2017 2:27 PM   Curtis [name] 1951-05-04 [id number]  Referring provider: [name], [place], [place] [address]  Chief Complaint  Patient presents with  . Hypogonadism    84mo    HPI: Patient is a 66 year old Caucasian male with testosterone deficiency, BPH with LU TS and ED who presents today for a 6 months follow up.   Testosterone deficiency He is still having spontaneous erections at night.  He has sleep apnea and is sleeping with a CPAP machine.  He has not had a sleep study and his wife denies any apneic episodes or snoring.   His pretreatment testosterone levels were 228 ng/dl on 01/31/2017 and 262 ng/dL on 02/04/2017.  His LH/FSH and prolactin levels were normal.   He is currently managing his hypogonadism with testosterone cypionate 200 mg IM, 0.5 cc q 2 weeks.  His current testosterone level is 672 ng/dL on 10/21/2017.  His HCT and Hbg are at baseline.    BPH WITH LUTS  (prostate and/or bladder) His IPSS score today is 5, which is mild lower urinary tract symptomatology.  He is delighted with his quality life due to his urinary symptoms.   His previous I PSS score was 11/2.  His previous PVR is 20 mL.  His major complaints today are frequency, intermittency, weak stream, straining to urinate and nocturia x 1.  He has had these symptoms for many years.  He denies any dysuria, hematuria or suprapubic pain.   PSA History  1.6 ng/mL in 12/2015  1.4 ng/mL in 12/2016     He also denies any recent fevers, chills, nausea or vomiting.  He does not have a family history of PCa.  IPSS    Row Name 10/28/17 1300         International Prostate Symptom Score   How often have you had the sensation of not emptying your bladder?  Not at All     How often have you had to urinate less than every two hours?  Less than 1 in 5 times     How often have you found you stopped and started again several times when you urinated?  Less than 1 in 5 times       How often have you found it difficult to postpone urination?  Not at All     How often have you had a weak urinary stream?  Less than half the time     How often have you had to strain to start urination?  Not at All     How many times did you typically get up at night to urinate?  1 Time     Total IPSS Score  5       Quality of Life due to urinary symptoms   If you were to spend the rest of your life with your urinary condition just the way it is now how would you feel about that?  Delighted        Score:  1-7 Mild 8-19 Moderate 20-35 Severe  Erectile dysfunction (penis) His SHIM score is 24, which is no ED.  His previous SHIM score was 10.   He has been having difficulty with erections for several.   His major complaint is lack of firmness.  His libido is diminished.   His risk factors for ED are age, testosterone deficiency, BPH, HTN and HLD.  TSH was 1.88 in 12/2016.   He denies any painful erections  or curvatures with his erections.   He is still having/no longer having spontaneous erections.  He has tried PDE5-inhibitors in the past, but he could not tolerate the side effects.     SHIM    Row Name 10/28/17 1338         SHIM: Over the last 6 months:   How do you rate your confidence that you could get and keep an erection?  High     When you had erections with sexual stimulation, how often were your erections hard enough for penetration (entering your partner)?  Almost Always or Always     During sexual intercourse, how often were you able to maintain your erection after you had penetrated (entered) your partner?  Almost Always or Always     During sexual intercourse, how difficult was it to maintain your erection to completion of intercourse?  Not Difficult     When you attempted sexual intercourse, how often was it satisfactory for you?  Almost Always or Always       SHIM Total Score   SHIM  24        Score: 1-7 Severe ED 8-11 Moderate ED 12-16 Mild-Moderate  ED 17-21 Mild ED 22-25 No ED  PMH: Past Medical History:  Diagnosis Date  . HTN (hypertension)   . Hyperlipidemia   . Hypogonadism in male   . Low back pain     Surgical History: Past Surgical History:  Procedure Laterality Date  . ACHILLES TENDON SURGERY Right 1995  . HERNIA REPAIR  2002   double  . Surgery on Left pinky in 2000      Home Medications:  Allergies as of 10/28/2017   No Known Allergies     Medication List        Accurate as of 10/28/17  2:27 PM. Always use your most recent med list.          lisinopril-hydrochlorothiazide 20-12.5 MG tablet Commonly known as:  PRINZIDE,ZESTORETIC TAKE ONE TABLET BY MOUTH ONCE DAILY   pravastatin 40 MG tablet Commonly known as:  PRAVACHOL Take 1 tablet (40 mg total) by mouth daily.   testosterone cypionate 200 MG/ML injection Commonly known as:  DEPO-TESTOSTERONE Inject 1 mL (200 mg total) into the muscle every 14 (fourteen) days. Inject 0.60mL q14days       Allergies: No Known Allergies  Family History: Family History  Problem Relation Age of Onset  . Hypertension Mother   . Arthritis Mother   . Vision loss Mother        due to age  . Cancer Father        in the liver    Social History:  reports that he has never smoked. He has never used smokeless tobacco. He reports that he drinks about 1.2 oz of alcohol per week. He reports that he does not use drugs.  ROS: UROLOGY Frequent Urination?: No Hard to postpone urination?: No Burning/pain with urination?: No Get up at night to urinate?: No Leakage of urine?: No Urine stream starts and stops?: No Trouble starting stream?: No Do you have to strain to urinate?: No Blood in urine?: No Urinary tract infection?: No Sexually transmitted disease?: No Injury to kidneys or bladder?: No Painful intercourse?: No Weak stream?: No Erection problems?: No Penile pain?: No  Gastrointestinal Nausea?: No Vomiting?: No Indigestion/heartburn?: No Diarrhea?:  No Constipation?: No  Constitutional Fever: No Night sweats?: No Weight loss?: No Fatigue?: No  Skin Skin rash/lesions?: No Itching?: No  Eyes  Blurred vision?: No Double vision?: No  Ears/Nose/Throat Sore throat?: No Sinus problems?: No  Hematologic/Lymphatic Swollen glands?: No Easy bruising?: No  Cardiovascular Leg swelling?: No Chest pain?: No  Respiratory Cough?: No Shortness of breath?: No  Endocrine Excessive thirst?: No  Musculoskeletal Back pain?: No Joint pain?: No  Neurological Headaches?: No Dizziness?: No  Psychologic Depression?: No Anxiety?: No  Physical Exam: BP (!) 210/92   Pulse 75   Ht 5\' 9"  (1.753 m)   Wt 195 lb (88.5 kg)   BMI 28.80 kg/m   Constitutional: Well nourished. Alert and oriented, No acute distress. HEENT: [place] AT, moist mucus membranes. Trachea midline, no masses. Cardiovascular: No clubbing, cyanosis, or edema. Respiratory: Normal respiratory effort, no increased work of breathing. GI: Abdomen is soft, non tender, non distended, no abdominal masses. Liver and spleen not palpable.  No hernias appreciated.  Stool sample for occult testing is not indicated.   GU: No CVA tenderness.  No bladder fullness or masses.  Patient with circumcised phallus.   Urethral meatus is patent.  No penile discharge. No penile lesions or rashes. Scrotum without lesions, cysts, rashes and/or edema.  Testicles are located scrotally bilaterally. No masses are appreciated in the testicles. Left and right epididymis are normal. Rectal: Patient with  normal sphincter tone. Anus and perineum without scarring or rashes. No rectal masses are appreciated. Prostate is approximately 50 grams, no nodules are appreciated. Seminal vesicles are normal. Skin: No rashes, bruises or suspicious lesions. Lymph: No cervical or inguinal adenopathy. Neurologic: Grossly intact, no focal deficits, moving all 4 extremities. Psychiatric: Normal mood and affect.     Laboratory Data: Lab Results  Component Value Date   WBC 4.9 01/01/2017   HGB 15.4 10/21/2017   HCT 44.4 10/21/2017   MCV 88 01/01/2017   PLT 154 01/01/2017    Lab Results  Component Value Date   CREATININE 0.94 01/01/2017    Lab Results  Component Value Date   TESTOSTERONE 672 10/21/2017     Lab Results  Component Value Date   TSH 1.880 01/01/2017       Component Value Date/Time   CHOL 190 01/01/2017 1031   HDL 41 01/01/2017 1031   CHOLHDL 4.6 01/01/2017 1031   LDLCALC 125 (H) 01/01/2017 1031    Lab Results  Component Value Date   AST 22 01/01/2017   Lab Results  Component Value Date   ALT 22 01/01/2017    I have independently reviewed the labs.  Assessment & Plan:    1. Testosterone deficiency   -most recent testosterone level is 672 ng/dL on 10/21/2017 (goal 450-600 ng/dL)  -continue testosterone cypionate 200 mg IM,  0.5 cc every 2 weeks - patient needs to return for injection teaching   -RTC in 6 months for HCT/HBG, testosterone, LFT's, ADAM and exam  2. BPH with LUTS  - IPSS score is 5/0, it is improving  - Continue conservative management, avoiding bladder irritants and timed voiding's  - RTC in 6 months for IPSS, PSA and exam, as testosterone therapy can cause prostate enlargement and worsen LU TS - if PSA is at baseline  3. Erectile dysfunction:     -SHIM score is 24, it is improved  -RTC in 6 months for SHIM score and exam, as testosterone therapy can affect erections  4. HTN He is going to be seen by his PCP's office today.     Return in about 6 months (around 04/29/2018) for IPSS, SHIM, testosterone level, Hbg, HCT ,  PSA and exam.  These notes generated with voice recognition software. I apologize for typographical errors.  Zara Council, [facility] [address] [place], [place] [address] [phone]

## 2017-10-28 ENCOUNTER — Ambulatory Visit: Payer: Self-pay

## 2017-10-28 ENCOUNTER — Telehealth: Payer: Self-pay | Admitting: Urology

## 2017-10-28 ENCOUNTER — Encounter: Payer: Self-pay | Admitting: Urology

## 2017-10-28 ENCOUNTER — Encounter: Payer: Self-pay | Admitting: Family Medicine

## 2017-10-28 ENCOUNTER — Ambulatory Visit (INDEPENDENT_AMBULATORY_CARE_PROVIDER_SITE_OTHER): Payer: Medicare Other | Admitting: Urology

## 2017-10-28 ENCOUNTER — Ambulatory Visit (INDEPENDENT_AMBULATORY_CARE_PROVIDER_SITE_OTHER): Payer: Medicare Other | Admitting: Family Medicine

## 2017-10-28 VITALS — BP 210/92 | HR 75 | Ht 69.0 in | Wt 195.0 lb

## 2017-10-28 VITALS — BP 172/82 | HR 60 | Temp 97.9°F | Wt 209.8 lb

## 2017-10-28 DIAGNOSIS — I10 Essential (primary) hypertension: Secondary | ICD-10-CM

## 2017-10-28 DIAGNOSIS — N138 Other obstructive and reflux uropathy: Secondary | ICD-10-CM

## 2017-10-28 DIAGNOSIS — E349 Endocrine disorder, unspecified: Secondary | ICD-10-CM | POA: Diagnosis not present

## 2017-10-28 DIAGNOSIS — N529 Male erectile dysfunction, unspecified: Secondary | ICD-10-CM | POA: Diagnosis not present

## 2017-10-28 DIAGNOSIS — N401 Enlarged prostate with lower urinary tract symptoms: Secondary | ICD-10-CM

## 2017-10-28 MED ORDER — LISINOPRIL-HYDROCHLOROTHIAZIDE 20-25 MG PO TABS: 1.0000 | ORAL_TABLET | Freq: Every day | ORAL | 3 refills | Status: DC

## 2017-10-28 NOTE — Telephone Encounter (Signed)
FYI - pt did not wish to make 6 mos follow up appt at checkout, states he is going to follow up with his GP and give [place] a call to schedule appts pending GP advice.

## 2017-10-28 NOTE — Telephone Encounter (Addendum)
[facility] called to report pt's BP elevated 210-220/92-97. Pt denies chest pain, blurred or double vision, weakness or numbness on either side of the body, dizziness, headache or chest pain. States he is having no sx except hhis BP being high. Advised pt to go to [facility].  During call noted that pt is a pt at [facility] not Dr Rance Muir office  Reason for Disposition . [3] Systolic BP  >= 419 OR Diastolic >= 622  AND [2] having NO cardiac or neurologic symptoms  Answer Assessment - Initial Assessment Questions 1. BLOOD PRESSURE: "What is the blood pressure?" "Did you take at least two measurements 5 minutes apart?"     210/92 highest was 220/97 2. ONSET: "When did you take your blood pressure?"     Was taken at doctors office 3. HOW: "How did you obtain the blood pressure?" (e.g., visiting nurse, automatic home BP monitor)     Automatic and manual  4. HISTORY: "Do you have a history of high blood pressure?"     yes 5. MEDICATIONS: "Are you taking any medications for blood pressure?" "Have you missed any doses recently?"     Yes-no doses missed 6. OTHER SYMPTOMS: "Do you have any symptoms?" (e.g., headache, chest pain, blurred vision, difficulty breathing, weakness)     no 7. PREGNANCY: "Is there any chance you are pregnant?" "When was your last menstrual period?"     n/a  Protocols used: HIGH BLOOD PRESSURE-A-AH

## 2017-10-28 NOTE — Progress Notes (Signed)
Patient: Curtis [name] Male    DOB: 05/24/1951   67 y.o.   MRN: [id number] Visit Date: 10/28/2017  Today's Provider: [name], PA   Chief Complaint  Patient presents with  . Hypertension   Subjective:    HPI  Hypertension, follow-up:  BP Readings from Last 3 Encounters:  10/28/17 (!) 172/82  10/28/17 (!) 210/92  01/29/17 (!) 188/71    He was last seen for hypertension 10 months ago.  BP at that visit was 128/84. Management changes since that visit include continue medication. He reports good compliance with treatment. He is not having side effects.  He is exercising. He is not adherent to low salt diet.   Outside blood pressures are being checked periodically. He is experiencing hypertensive blood pressure readings at 210/92 at his urologist's office today. Was there to be taught how to give himself a testosterone injection every 2 weeks.  Patient denies chest pain, chest pressure/discomfort, claudication, dyspnea, exertional chest pressure/discomfort, fatigue, irregular heart beat, lower extremity edema, near-syncope, orthopnea, palpitations, paroxysmal nocturnal dyspnea, syncope and tachypnea.   Cardiovascular risk factors include advanced age (older than 14 for men, 46 for women), dyslipidemia, hypertension and male gender.  Use of agents associated with hypertension: none and testosterone injection.     Weight trend: stable Wt Readings from Last 3 Encounters:  10/28/17 209 lb 12.8 oz (95.2 kg)  10/28/17 195 lb (88.5 kg)  01/29/17 200 lb (90.7 kg)    Current diet: in general, a "healthy" diet    ----------------------------------------------------------------------- Past Medical History:  Diagnosis Date  . HTN (hypertension)   . Hyperlipidemia   . Hypogonadism in male   . Low back pain    Past Surgical History:  Procedure Laterality Date  . ACHILLES TENDON SURGERY Right 1995  . HERNIA REPAIR  2002   double  . Surgery on Left pinky in  2000     Family History  Problem Relation Age of Onset  . Hypertension Mother   . Arthritis Mother   . Vision loss Mother        due to age  . Cancer Father        in the liver   No Known Allergies  Current Outpatient Medications:  .  lisinopril-hydrochlorothiazide (PRINZIDE,ZESTORETIC) 20-12.5 MG tablet, TAKE ONE TABLET BY MOUTH ONCE DAILY, Disp: 90 tablet, Rfl: 3 .  pravastatin (PRAVACHOL) 40 MG tablet, Take 1 tablet (40 mg total) by mouth daily., Disp: 90 tablet, Rfl: 3 .  testosterone cypionate (DEPO-TESTOSTERONE) 200 MG/ML injection, Inject 1 mL (200 mg total) into the muscle every 14 (fourteen) days. Inject 0.51mL q14days, Disp: 10 mL, Rfl: 0  Review of Systems  Constitutional: Negative.   Respiratory: Negative.   Cardiovascular: Negative.    Social History   Tobacco Use  . Smoking status: Never Smoker  . Smokeless tobacco: Never Used  Substance Use Topics  . Alcohol use: Yes    Alcohol/week: 1.2 oz    Types: 2 Standard drinks or equivalent per week    Comment: two beers once a week   Objective:   BP (!) 172/82 (BP Location: Right Arm, Patient Position: Sitting, Cuff Size: Normal)   Pulse 60   Temp 97.9 F (36.6 C) (Oral)   Wt 209 lb 12.8 oz (95.2 kg)   SpO2 97%   BMI 30.98 kg/m   Physical Exam  Constitutional: He is oriented to person, place, and time. He appears well-developed and well-nourished. No distress.  HENT:  Head: Normocephalic and atraumatic.  Right Ear: Hearing normal.  Left Ear: Hearing normal.  Nose: Nose normal.  Mouth/Throat: Oropharynx is clear and moist.  Eyes: Conjunctivae and lids are normal. Right eye exhibits no discharge. Left eye exhibits no discharge. No scleral icterus.  Neck: Neck supple. No JVD present. No thyromegaly present.  Cardiovascular: Normal rate and regular rhythm.  Pulmonary/Chest: Effort normal and breath sounds normal. No respiratory distress.  Musculoskeletal: Normal range of motion.  Neurological: He is alert  and oriented to person, place, and time.  Skin: Skin is intact. No lesion and no rash noted.  Psychiatric: He has a normal mood and affect. His speech is normal and behavior is normal. Thought content normal.      Assessment & Plan:     1. Essential hypertension Has been on the Zestoretic 20/12.5 mg qd regularly. Was at his urology appointment to be taught how to give himself a testosterone injection every 2 weeks. BP was very high. He admits to some "White Coat Syndrome" and drinking a 1/2 pot of coffee daily. Recheck of BP here was coming back down to more reasonable levels. Will change Zestoretic to 20/25 mg qd, encouraged to change to decaffeinated coffee, limit salt intake and continue present exercise program of 45 minute workout 3 days a week. Recheck on 01-03-18 for his annual physical. Do not feel he needs to go to the ER this time. - lisinopril-hydrochlorothiazide (PRINZIDE,ZESTORETIC) 20-25 MG tablet; Take 1 tablet by mouth daily.  Dispense: 90 tablet; Refill: 3  2. Testosterone deficiency Reminded this patient that testosterone can increase BP as a side effect. If this change in BP medications and life style/diet does not reduce BP, he may need further adjustments. Continue follow up with urologist and monitor BP at home.        [name], PA  [facility]

## 2017-10-28 NOTE — Patient Instructions (Signed)
Managing Your Hypertension Hypertension is commonly called high blood pressure. This is when the force of your blood pressing against the walls of your arteries is too strong. Arteries are blood vessels that carry blood from your heart throughout your body. Hypertension forces the heart to work harder to pump blood, and may cause the arteries to become narrow or stiff. Having untreated or uncontrolled hypertension can cause heart attack, stroke, kidney disease, and other problems. What are blood pressure readings? A blood pressure reading consists of a higher number over a lower number. Ideally, your blood pressure should be below 120/80. The first ("top") number is called the systolic pressure. It is a measure of the pressure in your arteries as your heart beats. The second ("bottom") number is called the diastolic pressure. It is a measure of the pressure in your arteries as the heart relaxes. What does my blood pressure reading mean? Blood pressure is classified into four stages. Based on your blood pressure reading, your health care provider may use the following stages to determine what type of treatment you need, if any. Systolic pressure and diastolic pressure are measured in a unit called mm Hg. Normal  Systolic pressure: below 120.  Diastolic pressure: below 80. Elevated  Systolic pressure: 120-129.  Diastolic pressure: below 80. Hypertension stage 1  Systolic pressure: 130-139.  Diastolic pressure: 80-89. Hypertension stage 2  Systolic pressure: 140 or above.  Diastolic pressure: 90 or above. What health risks are associated with hypertension? Managing your hypertension is an important responsibility. Uncontrolled hypertension can lead to:  A heart attack.  A stroke.  A weakened blood vessel (aneurysm).  Heart failure.  Kidney damage.  Eye damage.  Metabolic syndrome.  Memory and concentration problems.  What changes can I make to manage my  hypertension? Hypertension can be managed by making lifestyle changes and possibly by taking medicines. Your health care provider will help you make a plan to bring your blood pressure within a normal range. Eating and drinking  Eat a diet that is high in fiber and potassium, and low in salt (sodium), added sugar, and fat. An example eating plan is called the DASH (Dietary Approaches to Stop Hypertension) diet. To eat this way: ? Eat plenty of fresh fruits and vegetables. Try to fill half of your plate at each meal with fruits and vegetables. ? Eat whole grains, such as whole wheat pasta, brown rice, or whole grain bread. Fill about one quarter of your plate with whole grains. ? Eat low-fat diary products. ? Avoid fatty cuts of meat, processed or cured meats, and poultry with skin. Fill about one quarter of your plate with lean proteins such as fish, chicken without skin, beans, eggs, and tofu. ? Avoid premade and processed foods. These tend to be higher in sodium, added sugar, and fat.  Reduce your daily sodium intake. Most people with hypertension should eat less than 1,500 mg of sodium a day.  Limit alcohol intake to no more than 1 drink a day for nonpregnant women and 2 drinks a day for men. One drink equals 12 oz of beer, 5 oz of wine, or 1 oz of hard liquor. Lifestyle  Work with your health care provider to maintain a healthy body weight, or to lose weight. Ask what an ideal weight is for you.  Get at least 30 minutes of exercise that causes your heart to beat faster (aerobic exercise) most days of the week. Activities may include walking, swimming, or biking.  Include exercise   to strengthen your muscles (resistance exercise), such as weight lifting, as part of your weekly exercise routine. Try to do these types of exercises for 30 minutes at least 3 days a week.  Do not use any products that contain nicotine or tobacco, such as cigarettes and e-cigarettes. If you need help quitting, ask  your health care provider.  Control any long-term (chronic) conditions you have, such as high cholesterol or diabetes. Monitoring  Monitor your blood pressure at home as told by your health care provider. Your personal target blood pressure may vary depending on your medical conditions, your age, and other factors.  Have your blood pressure checked regularly, as often as told by your health care provider. Working with your health care provider  Review all the medicines you take with your health care provider because there may be side effects or interactions.  Talk with your health care provider about your diet, exercise habits, and other lifestyle factors that may be contributing to hypertension.  Visit your health care provider regularly. Your health care provider can help you create and adjust your plan for managing hypertension. Will I need medicine to control my blood pressure? Your health care provider may prescribe medicine if lifestyle changes are not enough to get your blood pressure under control, and if:  Your systolic blood pressure is 130 or higher.  Your diastolic blood pressure is 80 or higher.  Take medicines only as told by your health care provider. Follow the directions carefully. Blood pressure medicines must be taken as prescribed. The medicine does not work as well when you skip doses. Skipping doses also puts you at risk for problems. Contact a health care provider if:  You think you are having a reaction to medicines you have taken.  You have repeated (recurrent) headaches.  You feel dizzy.  You have swelling in your ankles.  You have trouble with your vision. Get help right away if:  You develop a severe headache or confusion.  You have unusual weakness or numbness, or you feel faint.  You have severe pain in your chest or abdomen.  You vomit repeatedly.  You have trouble breathing. Summary  Hypertension is when the force of blood pumping through  your arteries is too strong. If this condition is not controlled, it may put you at risk for serious complications.  Your personal target blood pressure may vary depending on your medical conditions, your age, and other factors. For most people, a normal blood pressure is less than 120/80.  Hypertension is managed by lifestyle changes, medicines, or both. Lifestyle changes include weight loss, eating a healthy, low-sodium diet, exercising more, and limiting alcohol. This information is not intended to replace advice given to you by your health care provider. Make sure you discuss any questions you have with your health care provider. Document Released: 04/09/2012 Document Revised: 06/13/2016 Document Reviewed: 06/13/2016 Elsevier Interactive Patient Education  2018 [employer]  

## 2017-10-29 ENCOUNTER — Ambulatory Visit: Payer: Medicare Other

## 2017-10-29 ENCOUNTER — Telehealth: Payer: Self-pay | Admitting: Urology

## 2017-10-29 LAB — PSA: Prostate Specific Ag, Serum: 2.4 ng/mL (ref 0.0–4.0)

## 2017-10-29 NOTE — Telephone Encounter (Signed)
Patient came in to the office this morning and he was notified. He has made a lab appointment.

## 2017-10-29 NOTE — Telephone Encounter (Signed)
I tried to contact the patient via his mobile.  It just rang and then hung up.  I was not able to leave a message regarding his appointment today.

## 2017-10-29 NOTE — Telephone Encounter (Signed)
-----   Message from Nori Riis, PA-C sent at 10/29/2017  7:38 AM EDT ----- Please let Curtis [name] know that his PSA has increased over the last year.  It is 2.4 and it was 1.4 last year.  We need to hold his testosterone injections at this time and repeat his PSA in 2 weeks.  It would be good for him to avoid spicy foods, bicycling/motorcyclicing, drinking alcohol or coffee and any ejaculations for the next two weeks as these things may increase a PSA.

## 2017-11-06 NOTE — Telephone Encounter (Signed)
Scheduled AWV for 01/02/18 @ 9:40 AM. -MM

## 2017-11-12 ENCOUNTER — Other Ambulatory Visit: Payer: Self-pay

## 2017-11-12 ENCOUNTER — Other Ambulatory Visit: Payer: Medicare Other

## 2017-11-12 DIAGNOSIS — N401 Enlarged prostate with lower urinary tract symptoms: Principal | ICD-10-CM

## 2017-11-12 DIAGNOSIS — N138 Other obstructive and reflux uropathy: Secondary | ICD-10-CM

## 2017-11-13 ENCOUNTER — Encounter: Payer: Self-pay | Admitting: Urology

## 2017-11-13 ENCOUNTER — Telehealth: Payer: Self-pay | Admitting: Urology

## 2017-11-13 LAB — PSA: Prostate Specific Ag, Serum: 1.7 ng/mL (ref 0.0–4.0)

## 2017-11-13 NOTE — Telephone Encounter (Signed)
Patient has been notified of his labs and apps have been made for follow up   michelle

## 2017-11-13 NOTE — Telephone Encounter (Signed)
Curtis [name] needs to be scheduled for a testosterone injection teaching so he can restart his injections.

## 2017-11-14 NOTE — Telephone Encounter (Signed)
Done ° ° °Curtis [name] °

## 2017-11-18 ENCOUNTER — Other Ambulatory Visit: Payer: Self-pay | Admitting: Family Medicine

## 2017-11-18 ENCOUNTER — Ambulatory Visit (INDEPENDENT_AMBULATORY_CARE_PROVIDER_SITE_OTHER): Payer: Medicare Other | Admitting: Family Medicine

## 2017-11-18 DIAGNOSIS — E291 Testicular hypofunction: Secondary | ICD-10-CM | POA: Diagnosis not present

## 2017-11-18 MED ORDER — SYRINGE (DISPOSABLE) 3 ML MISC: 0.5000 mL | 0 refills | Status: DC

## 2017-11-18 MED ORDER — HYPODERMIC NEEDLES-DISPOSABLE MISC: 0.5000 mL | 0 refills | Status: DC

## 2017-11-18 MED ORDER — "NEEDLE (DISP) 21G X 1-1/2"" MISC": 0.5000 mL | 0 refills | Status: DC

## 2017-11-18 MED ORDER — TESTOSTERONE CYPIONATE 200 MG/ML IM SOLN
200.0000 mg | Freq: Once | INTRAMUSCULAR | Status: AC
  Administered 2017-11-18: 200 mg via INTRAMUSCULAR

## 2017-11-18 NOTE — Progress Notes (Signed)
Testosterone IM Injection  Due to Hypogonadism patient is present today for a Testosterone Injection.  Medication: Testosterone Cypionate Dose: 0.5Ml Location: right upper outer leg Lot: [id number] Exp:04/2019  Patient tolerated well, no complications were noted Performed teaching for patient. He did everything appropriately and instruction were sent home for review.  Preformed by: Elberta Leatherwood, CMA

## 2017-12-09 DIAGNOSIS — D485 Neoplasm of uncertain behavior of skin: Secondary | ICD-10-CM | POA: Diagnosis not present

## 2017-12-09 DIAGNOSIS — L918 Other hypertrophic disorders of the skin: Secondary | ICD-10-CM | POA: Diagnosis not present

## 2017-12-09 DIAGNOSIS — D225 Melanocytic nevi of trunk: Secondary | ICD-10-CM | POA: Diagnosis not present

## 2017-12-09 DIAGNOSIS — D1801 Hemangioma of skin and subcutaneous tissue: Secondary | ICD-10-CM | POA: Diagnosis not present

## 2017-12-09 DIAGNOSIS — L820 Inflamed seborrheic keratosis: Secondary | ICD-10-CM | POA: Diagnosis not present

## 2017-12-09 DIAGNOSIS — D2261 Melanocytic nevi of right upper limb, including shoulder: Secondary | ICD-10-CM | POA: Diagnosis not present

## 2018-01-02 ENCOUNTER — Ambulatory Visit (INDEPENDENT_AMBULATORY_CARE_PROVIDER_SITE_OTHER): Payer: Medicare Other

## 2018-01-02 VITALS — BP 142/66 | HR 64 | Temp 98.7°F | Ht 69.0 in | Wt 203.2 lb

## 2018-01-02 DIAGNOSIS — Z0000 Encounter for general adult medical examination without abnormal findings: Secondary | ICD-10-CM

## 2018-01-02 DIAGNOSIS — Z23 Encounter for immunization: Secondary | ICD-10-CM | POA: Diagnosis not present

## 2018-01-02 NOTE — Patient Instructions (Addendum)
Curtis [name] , Thank you for taking time to come for your Medicare Wellness Visit. I appreciate your ongoing commitment to your health goals. Please review the following plan we discussed and let me know if I can assist you in the future.   Screening recommendations/referrals: Colonoscopy: Up to date Recommended yearly ophthalmology/optometry visit for glaucoma screening and checkup Recommended yearly dental visit for hygiene and checkup  Vaccinations: Influenza vaccine: Up to date Pneumococcal vaccine: Up to date Tdap vaccine: Up to date Shingles vaccine: Pt declines today.     Advanced directives: Please bring a copy of your POA (Power of Attorney) and/or Living Will to your next appointment.   Conditions/risks identified: Obesity- recommend cutting back on sweets and desserts in diet and monitor sugar intake.   Next appointment: 01/07/18 @ 9 AM with [name].  Preventive Care 67 Years and Older, Male Preventive care refers to lifestyle choices and visits with your health care provider that can promote health and wellness. What does preventive care include?  A yearly physical exam. This is also called an annual well check.  Dental exams once or twice a year.  Routine eye exams. Ask your health care provider how often you should have your eyes checked.  Personal lifestyle choices, including:  Daily care of your teeth and gums.  Regular physical activity.  Eating a healthy diet.  Avoiding tobacco and drug use.  Limiting alcohol use.  Practicing safe sex.  Taking low doses of aspirin every day.  Taking vitamin and mineral supplements as recommended by your health care provider. What happens during an annual well check? The services and screenings done by your health care provider during your annual well check will depend on your age, overall health, lifestyle risk factors, and family history of disease. Counseling  Your health care provider may ask you questions  about your:  Alcohol use.  Tobacco use.  Drug use.  Emotional well-being.  Home and relationship well-being.  Sexual activity.  Eating habits.  History of falls.  Memory and ability to understand (cognition).  Work and work Statistician. Screening  You may have the following tests or measurements:  Height, weight, and BMI.  Blood pressure.  Lipid and cholesterol levels. These may be checked every 5 years, or more frequently if you are over 29 years old.  Skin check.  Lung cancer screening. You may have this screening every year starting at age 91 if you have a 30-pack-year history of smoking and currently smoke or have quit within the past 15 years.  Fecal occult blood test (FOBT) of the stool. You may have this test every year starting at age 23.  Flexible sigmoidoscopy or colonoscopy. You may have a sigmoidoscopy every 5 years or a colonoscopy every 10 years starting at age 95.  Prostate cancer screening. Recommendations will vary depending on your family history and other risks.  Hepatitis C blood test.  Hepatitis B blood test.  Sexually transmitted disease (STD) testing.  Diabetes screening. This is done by checking your blood sugar (glucose) after you have not eaten for a while (fasting). You may have this done every 1-3 years.  Abdominal aortic aneurysm (AAA) screening. You may need this if you are a current or former smoker.  Osteoporosis. You may be screened starting at age 23 if you are at high risk. Talk with your health care provider about your test results, treatment options, and if necessary, the need for more tests. Vaccines  Your health care provider may recommend  certain vaccines, such as:  Influenza vaccine. This is recommended every year.  Tetanus, diphtheria, and acellular pertussis (Tdap, Td) vaccine. You may need a Td booster every 10 years.  Zoster vaccine. You may need this after age 23.  Pneumococcal 13-valent conjugate (PCV13)  vaccine. One dose is recommended after age 70.  Pneumococcal polysaccharide (PPSV23) vaccine. One dose is recommended after age 30. Talk to your health care provider about which screenings and vaccines you need and how often you need them. This information is not intended to replace advice given to you by your health care provider. Make sure you discuss any questions you have with your health care provider. Document Released: 08/12/2015 Document Revised: 04/04/2016 Document Reviewed: 05/17/2015 Elsevier Interactive Patient Education  2017 [place] Prevention in the Home Falls can cause injuries. They can happen to people of all ages. There are many things you can do to make your home safe and to help prevent falls. What can I do on the outside of my home?  Regularly fix the edges of walkways and driveways and fix any cracks.  Remove anything that might make you trip as you walk through a door, such as a raised step or threshold.  Trim any bushes or trees on the path to your home.  Use bright outdoor lighting.  Clear any walking paths of anything that might make someone trip, such as rocks or tools.  Regularly check to see if handrails are loose or broken. Make sure that both sides of any steps have handrails.  Any raised decks and porches should have guardrails on the edges.  Have any leaves, snow, or ice cleared regularly.  Use sand or salt on walking paths during winter.  Clean up any spills in your garage right away. This includes oil or grease spills. What can I do in the bathroom?  Use night lights.  Install grab bars by the toilet and in the tub and shower. Do not use towel bars as grab bars.  Use non-skid mats or decals in the tub or shower.  If you need to sit down in the shower, use a plastic, non-slip stool.  Keep the floor dry. Clean up any water that spills on the floor as soon as it happens.  Remove soap buildup in the tub or shower  regularly.  Attach bath mats securely with double-sided non-slip rug tape.  Do not have throw rugs and other things on the floor that can make you trip. What can I do in the bedroom?  Use night lights.  Make sure that you have a light by your bed that is easy to reach.  Do not use any sheets or blankets that are too big for your bed. They should not hang down onto the floor.  Have a firm chair that has side arms. You can use this for support while you get dressed.  Do not have throw rugs and other things on the floor that can make you trip. What can I do in the kitchen?  Clean up any spills right away.  Avoid walking on wet floors.  Keep items that you use a lot in easy-to-reach places.  If you need to reach something above you, use a strong step stool that has a grab bar.  Keep electrical cords out of the way.  Do not use floor polish or wax that makes floors slippery. If you must use wax, use non-skid floor wax.  Do not have throw rugs and other  things on the floor that can make you trip. What can I do with my stairs?  Do not leave any items on the stairs.  Make sure that there are handrails on both sides of the stairs and use them. Fix handrails that are broken or loose. Make sure that handrails are as long as the stairways.  Check any carpeting to make sure that it is firmly attached to the stairs. Fix any carpet that is loose or worn.  Avoid having throw rugs at the top or bottom of the stairs. If you do have throw rugs, attach them to the floor with carpet tape.  Make sure that you have a light switch at the top of the stairs and the bottom of the stairs. If you do not have them, ask someone to add them for you. What else can I do to help prevent falls?  Wear shoes that:  Do not have high heels.  Have rubber bottoms.  Are comfortable and fit you well.  Are closed at the toe. Do not wear sandals.  If you use a stepladder:  Make sure that it is fully  opened. Do not climb a closed stepladder.  Make sure that both sides of the stepladder are locked into place.  Ask someone to hold it for you, if possible.  Clearly mark and make sure that you can see:  Any grab bars or handrails.  First and last steps.  Where the edge of each step is.  Use tools that help you move around (mobility aids) if they are needed. These include:  Canes.  Walkers.  Scooters.  Crutches.  Turn on the lights when you go into a dark area. Replace any light bulbs as soon as they burn out.  Set up your furniture so you have a clear path. Avoid moving your furniture around.  If any of your floors are uneven, fix them.  If there are any pets around you, be aware of where they are.  Review your medicines with your doctor. Some medicines can make you feel dizzy. This can increase your chance of falling. Ask your doctor what other things that you can do to help prevent falls. This information is not intended to replace advice given to you by your health care provider. Make sure you discuss any questions you have with your health care provider. Document Released: 05/12/2009 Document Revised: 12/22/2015 Document Reviewed: 08/20/2014 Elsevier Interactive Patient Education  2017 [employer].

## 2018-01-02 NOTE — Progress Notes (Addendum)
Subjective:   Curtis [name] is a 67 y.o. male who presents for an Initial Medicare Annual Wellness Visit.  Review of Systems  N/A  Cardiac Risk Factors include: advanced age (>41men, >14 women);dyslipidemia;hypertension;male gender    Objective:    Today's Vitals   01/02/18 0938 01/02/18 1007  BP: (!) 148/78 (!) 142/66  Pulse: 64   Temp: 98.7 F (37.1 C)   TempSrc: Oral   Weight: 203 lb 3.2 oz (92.2 kg)   Height: 5\' 9"  (1.753 m)   PainSc: 0-No pain    Body mass index is 30.01 kg/m.  Advanced Directives 01/02/2018  Does Patient Have a Medical Advance Directive? Yes  Type of Paramedic of [place];Living will  Copy of [place] in Chart? No - copy requested    Current Medications (verified) Outpatient Encounter Medications as of 01/02/2018  Medication Sig  . Hypodermic Needles-Disposable (SAFETY-GARD NEEDLE 18G) MISC 0.5 mLs by Does not apply route every 14 (fourteen) days.  [name] Kitchen lisinopril-hydrochlorothiazide (PRINZIDE,ZESTORETIC) 20-25 MG tablet Take 1 tablet by mouth daily.  [name] Kitchen NEEDLE, DISP, 21 G (BD SAFETYGLIDE SHIELDED NEEDLE) 21G X 1-1/2" MISC 0.5 mLs by Does not apply route every 14 (fourteen) days.  . niacin 500 MG tablet Take 500 mg by mouth daily.  . pravastatin (PRAVACHOL) 40 MG tablet Take 1 tablet (40 mg total) by mouth daily. (Patient taking differently: Take 20 mg by mouth daily. )  . Syringe, Disposable, 3 ML MISC 0.5 mLs by Does not apply route every 14 (fourteen) days.  [name] Kitchen testosterone cypionate (DEPO-TESTOSTERONE) 200 MG/ML injection Inject 1 mL (200 mg total) into the muscle every 14 (fourteen) days. Inject 0.43mL q14days   No facility-administered encounter medications on file as of 01/02/2018.     Allergies (verified) Patient has no known allergies.   History: Past Medical History:  Diagnosis Date  . HTN (hypertension)   . Hyperlipidemia   . Hypogonadism in male   . Low back pain    Past Surgical  History:  Procedure Laterality Date  . ACHILLES TENDON SURGERY Right 1995  . HERNIA REPAIR  2002   double  . Surgery on Left pinky in 2000     Family History  Problem Relation Age of Onset  . Hypertension Mother   . Arthritis Mother   . Vision loss Mother        due to age  . Cancer Father        in the liver   Social History   Socioeconomic History  . Marital status: Married    Spouse name: Not on file  . Number of children: 2  . Years of education: Not on file  . Highest education level: Bachelor's degree (e.g., BA, AB, BS)  Occupational History  . 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 Financial resource strain: Not hard at all  . Food insecurity:    Worry: Never true    Inability: Never true  . Transportation needs:    Medical: No    Non-medical: No  Tobacco Use  . Smoking status: Never Smoker  . Smokeless tobacco: Former Systems developer    Types: Snuff  . Tobacco comment: quit snuff at age 59  Substance and Sexual Activity  . Alcohol use: Yes    Alcohol/week: 1.2 - 2.4 oz    Types: 2 - 4 Cans of beer per week  . Drug use: No  . Sexual activity: Not on file  Lifestyle  . Physical activity:    Days per week: Not on file    Minutes per session: Not on file  . Stress: Only a little  Relationships  . Social connections:    Talks on phone: Not on file    Gets together: Not on file    Attends religious service: Not on file    Active member of club or organization: Not on file    Attends meetings of clubs or organizations: Not on file    Relationship status: Not on file  Other Topics Concern  . Not on file  Social History Narrative  . Not on file   Tobacco Counseling Counseling given: Not Answered Comment: quit snuff at age 35   Clinical Intake:  Pre-visit preparation completed: Yes  Pain : No/denies pain Pain Score: 0-No pain     Nutritional Status: BMI > 30  Obese Nutritional Risks: None Diabetes: No  How often do you  need to have someone help you when you read instructions, pamphlets, or other written materials from your doctor or pharmacy?: 1 - Never  Interpreter Needed?: No  Information entered by :: [facility], LPN  Activities of Daily Living In your present state of health, do you have any difficulty performing the following activities: 01/02/2018  Hearing? N  Vision? N  Difficulty concentrating or making decisions? N  Walking or climbing stairs? N  Dressing or bathing? N  Doing errands, shopping? N  Preparing Food and eating ? N  Using the Toilet? N  In the past six months, have you accidently leaked urine? N  Do you have problems with loss of bowel control? N  Managing your Medications? N  Managing your Finances? N  Housekeeping or managing your Housekeeping? N  Some recent data might be hidden     Immunizations and Health Maintenance Immunization History  Administered Date(s) Administered  . Influenza-Unspecified 05/07/2017  . Pneumococcal Conjugate-13 01/01/2017  . Pneumococcal Polysaccharide-23 01/02/2018  . Td 08/06/2001  . Tdap 10/03/2010  . Zoster 11/11/2015   There are no preventive care reminders to display for this patient.  Patient Care Team: Chrismon, [name], PA as PCP - General (Physician Assistant) Laneta Simmers as Physician Assistant (Urology)  Indicate any recent Medical Services you may have received from other than [facility] providers in the past year (date may be approximate).    Assessment:   This is a routine wellness examination for [place].  Hearing/Vision screen No exam data present  Dietary issues and exercise activities discussed: Current Exercise Habits: Structured exercise class, Type of exercise: walking;strength training/weights;treadmill, Time (Minutes): 45, Frequency (Times/Week): 3, Weekly Exercise (Minutes/Week): 135, Intensity: Moderate, Exercise limited by: None identified  Goals    . DIET - REDUCE SUGAR INTAKE     Recommend  cutting back in sweets and desserts in diet and monitor sugar intake.       Depression Screen PHQ 2/9 Scores 01/02/2018 01/01/2017 11/11/2015  PHQ - 2 Score 0 0 0  PHQ- 9 Score - 0 -    Fall Risk Fall Risk  01/02/2018 01/01/2017  Falls in the past year? No No    Is the patient's home free of loose throw rugs in walkways, pet beds, electrical cords, etc?   yes      Grab bars in the bathroom? yes      Handrails on the stairs?   no      Adequate lighting?   yes  Timed Get Up and Go performed:  N/A  Cognitive Function:     6CIT Screen 01/02/2018  What Year? 0 points  What month? 0 points  What time? 0 points  Count back from 20 0 points  Months in reverse 0 points  Repeat phrase 4 points  Total Score 4    Screening Tests Health Maintenance  Topic Date Due  . INFLUENZA VACCINE  02/27/2018  . TETANUS/TDAP  10/02/2020  . COLONOSCOPY  01/01/2022  . Hepatitis C Screening  Completed  . PNA vac Low Risk Adult  Completed    Qualifies for Shingles Vaccine? Due for Shingles vaccine. Declined my offer to administer today. Education has been provided regarding the importance of this vaccine. Pt has been advised to call her insurance company to determine her out of pocket expense. Advised she may also receive this vaccine at her local pharmacy or Health Dept. Verbalized acceptance and understanding.  Cancer Screenings: Lung: Low Dose CT Chest recommended if Age 58-80 years, 30 pack-year currently smoking OR have quit w/in 15years. Patient does not qualify. Colorectal: Up to date  Additional Screenings:  Hepatitis C Screening: Up to date      Plan:  I have personally reviewed and addressed the Medicare Annual Wellness questionnaire and have noted the following in the patient's chart:  A. Medical and social history B. Use of alcohol, tobacco or illicit drugs  C. Current medications and supplements D. Functional ability and status E.  Nutritional status F.  Physical activity G. Advance  directives H. List of other physicians I.  Hospitalizations, surgeries, and ER visits in previous 12 months J.  [place] such as hearing and vision if needed, cognitive and depression L. Referrals and appointments - none  In addition, I have reviewed and discussed with patient certain preventive protocols, quality metrics, and best practice recommendations. A written personalized care plan for preventive services as well as general preventive health recommendations were provided to patient.  See attached scanned questionnaire for additional information.   Signed,  Fabio Neighbors, LPN Nurse Health Advisor   Nurse Recommendations: None.   Reviewed documentation and recommendations of the Nurse Health Advisor's screening. Agree with not and plan.

## 2018-01-03 ENCOUNTER — Ambulatory Visit: Payer: Medicare Other

## 2018-01-03 ENCOUNTER — Ambulatory Visit: Payer: Medicare Other | Admitting: Family Medicine

## 2018-01-06 ENCOUNTER — Encounter: Payer: Self-pay | Admitting: Family Medicine

## 2018-01-07 ENCOUNTER — Ambulatory Visit (INDEPENDENT_AMBULATORY_CARE_PROVIDER_SITE_OTHER): Payer: Medicare Other | Admitting: Family Medicine

## 2018-01-07 ENCOUNTER — Encounter: Payer: Self-pay | Admitting: Family Medicine

## 2018-01-07 VITALS — BP 152/74 | HR 61 | Temp 98.0°F | Ht 69.0 in | Wt 201.8 lb

## 2018-01-07 DIAGNOSIS — E782 Mixed hyperlipidemia: Secondary | ICD-10-CM

## 2018-01-07 DIAGNOSIS — E349 Endocrine disorder, unspecified: Secondary | ICD-10-CM

## 2018-01-07 DIAGNOSIS — Z0000 Encounter for general adult medical examination without abnormal findings: Secondary | ICD-10-CM | POA: Diagnosis not present

## 2018-01-07 DIAGNOSIS — I10 Essential (primary) hypertension: Secondary | ICD-10-CM

## 2018-01-07 NOTE — Progress Notes (Signed)
Patient: Curtis [name], Male    DOB: 03/30/1951, 67 y.o.   MRN: [id number] Visit Date: 01/07/2018  Today's Provider: [name], PA   Chief Complaint  Patient presents with  . Annual Exam   Subjective:     Complete Physical NAGI [name] is a 67 y.o. male. He feels well. He reports exercising 3 times per week. He reports he is sleeping well.  ----------------------------------------------------------- Patient had a AWE on 01/02/18.  Review of Systems  Constitutional: Negative.   HENT: Negative.   Eyes: Negative.   Respiratory: Negative.   Cardiovascular: Negative.   Gastrointestinal: Negative.   Endocrine: Negative.   Genitourinary: Negative.   Musculoskeletal: Negative.   Skin: Negative.   Allergic/Immunologic: Negative.   Neurological: Negative.   Hematological: Negative.   Psychiatric/Behavioral: Negative.     Social History   Socioeconomic History  . Marital status: Married    Spouse name: Not on file  . Number of children: 2  . Years of education: Not on file  . Highest education level: Bachelor's degree (e.g., BA, AB, BS)  Occupational History  . 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 Financial resource strain: Not hard at all  . Food insecurity:    Worry: Never true    Inability: Never true  . Transportation needs:    Medical: No    Non-medical: No  Tobacco Use  . Smoking status: Never Smoker  . Smokeless tobacco: Former Systems developer    Types: Snuff  . Tobacco comment: quit snuff at age 89  Substance and Sexual Activity  . Alcohol use: Yes    Alcohol/week: 1.2 - 2.4 oz    Types: 2 - 4 Cans of beer per week  . Drug use: No  . Sexual activity: Not on file  Lifestyle  . Physical activity:    Days per week: Not on file    Minutes per session: Not on file  . Stress: Only a little  Relationships  . Social connections:    Talks on phone: Not on file    Gets together: Not on file    Attends religious  service: Not on file    Active member of club or organization: Not on file    Attends meetings of clubs or organizations: Not on file    Relationship status: Not on file  . Intimate partner violence:    Fear of current or ex partner: Not on file    Emotionally abused: Not on file    Physically abused: Not on file    Forced sexual activity: Not on file  Other Topics Concern  . Not on file  Social History Narrative  . Not on file   Past Medical History:  Diagnosis Date  . HTN (hypertension)   . Hyperlipidemia   . Hypogonadism in male   . Low back pain     Patient Active Problem List   Diagnosis Date Noted  . Hypertension 11/11/2015  . Hyperlipidemia 11/11/2015  . Testosterone deficiency 02/03/2015  . 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 ACHILLES TENDON SURGERY Right 1995  . HERNIA REPAIR  2002   double  . Surgery on Left pinky in 2000     His family history includes Arthritis in his mother; Cancer in his father; Hypertension in his mother; Vision loss in his mother.  Current Outpatient Medications:  .  Hypodermic Needles-Disposable (SAFETY-GARD NEEDLE 18G) MISC, 0.5 mLs by Does not apply route every 14 (fourteen) days., Disp: 30 each, Rfl: 0 .  lisinopril-hydrochlorothiazide (PRINZIDE,ZESTORETIC) 20-25 MG tablet, Take 1 tablet by mouth daily., Disp: 90 tablet, Rfl: 3 .  NEEDLE, DISP, 21 G (BD SAFETYGLIDE SHIELDED NEEDLE) 21G X 1-1/2" MISC, 0.5 mLs by Does not apply route every 14 (fourteen) days., Disp: 30 each, Rfl: 0 .  niacin 500 MG tablet, Take 500 mg by mouth daily., Disp: , Rfl:  .  pravastatin (PRAVACHOL) 40 MG tablet, Take 1 tablet (40 mg total) by mouth daily. (Patient taking differently: Take 20 mg by mouth daily. ), Disp: 90 tablet, Rfl: 3 .  Syringe, Disposable, 3 ML MISC, 0.5 mLs by Does not apply route every 14 (fourteen) days.,  Disp: 30 each, Rfl: 0 .  testosterone cypionate (DEPO-TESTOSTERONE) 200 MG/ML injection, Inject 1 mL (200 mg total) into the muscle every 14 (fourteen) days. Inject 0.51mL q14days, Disp: 10 mL, Rfl: 0  Patient Care Team: Kazuto Sevey, [name], PA as PCP - General (Physician Assistant) Nori Riis, PA-C as Physician Assistant (Urology)    Objective:   Vitals: BP (!) 152/74 (BP Location: Right Arm, Patient Position: Sitting, Cuff Size: Normal)   Pulse 61   Temp 98 F (36.7 C) (Oral)   Ht 5\' 9"  (1.753 m)   Wt 201 lb 12.8 oz (91.5 kg)   SpO2 98%   BMI 29.80 kg/m   Physical Exam  Constitutional: He is oriented to person, place, and time. He appears well-developed and well-nourished.  HENT:  Head: Normocephalic and atraumatic.  Right Ear: External ear normal.  Left Ear: External ear normal.  Nose: Nose normal.  Mouth/Throat: Oropharynx is clear and moist.  Eyes: Pupils are equal, round, and reactive to light. Conjunctivae and EOM are normal. Right eye exhibits no discharge.  Neck: Normal range of motion. Neck supple. No tracheal deviation present. No thyromegaly present.  Cardiovascular: Normal rate, regular rhythm, normal heart sounds and intact distal pulses.  No murmur heard. Pulmonary/Chest: Effort normal and breath sounds normal. No respiratory distress. He has no wheezes. He has no rales. He exhibits no tenderness.  Abdominal: Soft. He exhibits no distension and no mass. There is no tenderness. There is no rebound and no guarding.  Genitourinary:  Genitourinary Comments: Deferred to urologist following testosterone deficiency and BPH.  Musculoskeletal: Normal range of motion. He exhibits no edema or tenderness.  Deformity of the left 5th finger from past trauma and surgical pinning. Nodule/bone spur left great toe medially suspected secondary to past trauma (dropped a jar on it). No pain or loss of function.  Lymphadenopathy:    He has no cervical adenopathy.  Neurological:  He is alert and oriented to person, place, and time. He has normal reflexes. He displays normal reflexes. No cranial nerve deficit. He exhibits normal muscle tone. Coordination normal.  Skin: Skin is warm and dry. No rash noted. No erythema.  Psychiatric: He has a normal mood and affect. His behavior is normal. Judgment and thought content normal.    Activities of Daily Living In your present state of health, do you have any difficulty performing the following activities: 01/02/2018  Hearing? N  Vision? N  Difficulty concentrating or making decisions? N  Walking or climbing stairs? N  Dressing or bathing? N  Doing errands, shopping? N  Preparing Food and eating ? N  Using the Toilet? N  In the past six  months, have you accidently leaked urine? N  Do you have problems with loss of bowel control? N  Managing your Medications? N  Managing your Finances? N  Housekeeping or managing your Housekeeping? N  Some recent data might be hidden   Fall Risk Assessment Fall Risk  01/02/2018 01/01/2017  Falls in the past year? No No   Depression Screen PHQ 2/9 Scores 01/02/2018 01/01/2017 11/11/2015  PHQ - 2 Score 0 0 0  PHQ- 9 Score - 0 -   Assessment & Plan:    Annual Physical Reviewed patient's Family Medical History Reviewed and updated list of patient's medical providers Assessment of cognitive impairment was done Assessed patient's functional ability Established a written schedule for health screening [place] Completed and Reviewed  Exercise Activities and Dietary recommendations Goals    . DIET - REDUCE SUGAR INTAKE     Recommend cutting back in sweets and desserts in diet and monitor sugar intake.        Immunization History  Administered Date(s) Administered  . Influenza-Unspecified 05/07/2017  . Pneumococcal Conjugate-13 01/01/2017  . Pneumococcal Polysaccharide-23 01/02/2018  . Td 08/06/2001  . Tdap 10/03/2010  . Zoster 11/11/2015    Health  Maintenance  Topic Date Due  . INFLUENZA VACCINE  02/27/2018  . TETANUS/TDAP  10/02/2020  . COLONOSCOPY  01/01/2022  . Hepatitis C Screening  Completed  . PNA vac Low Risk Adult  Completed     Discussed health benefits of physical activity, and encouraged him to engage in regular exercise appropriate for his age and condition.    ------------------------------------------------------------------------------------------------------------ 1. Annual physical exam Good general health. 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 Screening assessments in good shape. Has a little trouble with "White Coat Syndrome". Given anticipatory guidance. Check routine labs. - Lipid Profile - CBC with Differential - TSH - Comprehensive Metabolic Panel (CMET)  2. Essential hypertension Fairly stable BP and tolerating Lisinopril-Hydrochlorothiazide 20-12.5 mg qd. Denies chest pains, dyspnea, palpitations, muscle cramps or edema. Recheck CBC, TSH and CMP.  - CBC with Differential - TSH - Comprehensive Metabolic Panel (CMET)  3. Mixed hyperlipidemia Tolerating Pravastatin 20 mg qd without side effects. Trying to maintain weight below 200 with low fat diet and exercise. Recheck labs and follow up pending report. - Lipid Profile - TSH - Comprehensive Metabolic Panel (CMET)  4. Testosterone deficiency Followed at [facility] Zara Council, PA-C). Gets testosterone injection every 2 weeks. Will recheck CBC and CMP. - CBC with Differential - Comprehensive Metabolic Panel (CMET)    [name], PA  [facility]

## 2018-01-08 LAB — CBC WITH DIFFERENTIAL/PLATELET
BASOS: 1 %
Basophils Absolute: 0 10*3/uL (ref 0.0–0.2)
EOS (ABSOLUTE): 0.1 10*3/uL (ref 0.0–0.4)
EOS: 2 %
HEMOGLOBIN: 14.9 g/dL (ref 13.0–17.7)
Hematocrit: 42.8 % (ref 37.5–51.0)
IMMATURE GRANS (ABS): 0 10*3/uL (ref 0.0–0.1)
Immature Granulocytes: 0 %
LYMPHS ABS: 1.5 10*3/uL (ref 0.7–3.1)
LYMPHS: 24 %
MCH: 31.7 pg (ref 26.6–33.0)
MCHC: 34.8 g/dL (ref 31.5–35.7)
MCV: 91 fL (ref 79–97)
MONOCYTES: 14 %
Monocytes Absolute: 0.9 10*3/uL (ref 0.1–0.9)
NEUTROS ABS: 3.7 10*3/uL (ref 1.4–7.0)
Neutrophils: 59 %
Platelets: 158 10*3/uL (ref 150–450)
RBC: 4.7 x10E6/uL (ref 4.14–5.80)
RDW: 13.2 % (ref 12.3–15.4)
WBC: 6.1 10*3/uL (ref 3.4–10.8)

## 2018-01-08 LAB — LIPID PANEL
CHOL/HDL RATIO: 5 ratio (ref 0.0–5.0)
CHOLESTEROL TOTAL: 176 mg/dL (ref 100–199)
HDL: 35 mg/dL — ABNORMAL LOW (ref >39)
LDL Calculated: 115 mg/dL — ABNORMAL HIGH (ref 0–99)
TRIGLYCERIDES: 130 mg/dL (ref 0–149)
VLDL Cholesterol Cal: 26 mg/dL (ref 5–40)

## 2018-01-08 LAB — COMPREHENSIVE METABOLIC PANEL
ALBUMIN: 4.6 g/dL (ref 3.6–4.8)
ALT: 17 IU/L (ref 0–44)
AST: 20 IU/L (ref 0–40)
Albumin/Globulin Ratio: 2.4 — ABNORMAL HIGH (ref 1.2–2.2)
Alkaline Phosphatase: 44 IU/L (ref 39–117)
BILIRUBIN TOTAL: 0.6 mg/dL (ref 0.0–1.2)
BUN / CREAT RATIO: 14 (ref 10–24)
BUN: 13 mg/dL (ref 8–27)
CHLORIDE: 103 mmol/L (ref 96–106)
CO2: 24 mmol/L (ref 20–29)
Calcium: 8.8 mg/dL (ref 8.6–10.2)
Creatinine, Ser: 0.93 mg/dL (ref 0.76–1.27)
GFR calc non Af Amer: 85 mL/min/{1.73_m2} (ref >59)
GFR, EST AFRICAN AMERICAN: 99 mL/min/{1.73_m2} (ref >59)
GLOBULIN, TOTAL: 1.9 g/dL (ref 1.5–4.5)
Glucose: 100 mg/dL — ABNORMAL HIGH (ref 65–99)
Potassium: 3.8 mmol/L (ref 3.5–5.2)
SODIUM: 142 mmol/L (ref 134–144)
TOTAL PROTEIN: 6.5 g/dL (ref 6.0–8.5)

## 2018-01-08 LAB — TSH: TSH: 1.49 u[IU]/mL (ref 0.450–4.500)

## 2018-02-12 NOTE — Progress Notes (Signed)
02/13/2018 9:27 AM   Curtis [name] [birth year] [id number]  Referring provider: [name], [place] [place], [place] [address]  Chief Complaint  Patient presents with  . Testosterone Deficiency    HPI: Patient is a 67 year old Caucasian male with testosterone deficiency, BPH with LU TS and ED who presents today for a 6 months follow up.   Testosterone deficiency He is still having spontaneous erections at night.  His pretreatment testosterone levels were 228 ng/dl on 01/31/2017 and 262 ng/dL on 02/04/2017.  His LH/FSH and prolactin levels were normal.   He is currently managing his testosterone deficiency with testosterone cypionate 200 mg IM, 0.5 cc q 2 weeks.  His testosterone level was 672 ng/dL on 10/21/2017.  His HCT was 44.4% and Hbg was 15.4 on 10/21/2017.  His testosterone injections were discontinued due to an elevation in PSA.      BPH WITH LUTS  (prostate and/or bladder) His IPSS score today is 4, which is mild lower urinary tract symptomatology.  He is pleased with his quality life due to his urinary symptoms.   His previous I PSS score was 5/0.  His previous PVR is 20 mL.  His major complaints today are frequency, intermittency, weak stream, straining to urinate and nocturia x 1.  He has had these symptoms for many years.  He denies any dysuria, hematuria or suprapubic pain.  He is "milking his prostate" with a device monthly.    PSA History  1.6 ng/mL in 12/2015  1.4 ng/mL in 12/2016  2.4 ng/mL in 10/2017  1.7 ng/mL in 10/2017     He also denies any recent fevers, chills, nausea or vomiting.  He does not have a family history of PCa.  IPSS    Row Name 02/13/18 0800         International Prostate Symptom Score   How often have you had the sensation of not emptying your bladder?  Not at All     How often have you had to urinate less than every two hours?  Not at All     How often have you found you stopped and started again several times  when you urinated?  Less than 1 in 5 times     How often have you found it difficult to postpone urination?  Not at All     How often have you had a weak urinary stream?  Less than half the time     How often have you had to strain to start urination?  Not at All     How many times did you typically get up at night to urinate?  1 Time     Total IPSS Score  4       Quality of Life due to urinary symptoms   If you were to spend the rest of your life with your urinary condition just the way it is now how would you feel about that?  Pleased        Score:  1-7 Mild 8-19 Moderate 20-35 Severe  Erectile dysfunction (penis) His SHIM score is 22, which is no ED.  His previous SHIM score was 24.   He has been having difficulty with erections for several.   His major complaint is lack of firmness.  His libido is diminished.   His risk factors for ED are age, testosterone deficiency, BPH, HTN and HLD.  TSH was 1.88 in 12/2016.   He denies any painful  erections or curvatures with his erections.   He is still having spontaneous erections.  He has tried PDE5-inhibitors in the past, but he could not tolerate the side effects.    SHIM    Row Name 02/13/18 0847         SHIM: Over the last 6 months:   How do you rate your confidence that you could get and keep an erection?  High     When you had erections with sexual stimulation, how often were your erections hard enough for penetration (entering your partner)?  Most Times (much more than half the time)     During sexual intercourse, how often were you able to maintain your erection after you had penetrated (entered) your partner?  Most Times (much more than half the time)     During sexual intercourse, how difficult was it to maintain your erection to completion of intercourse?  Not Difficult     When you attempted sexual intercourse, how often was it satisfactory for you?  Almost Always or Always       SHIM Total Score   SHIM  22         Score: 1-7 Severe ED 8-11 Moderate ED 12-16 Mild-Moderate ED 17-21 Mild ED 22-25 No ED  PMH: Past Medical History:  Diagnosis Date  . HTN (hypertension)   . Hyperlipidemia   . Hypogonadism in male   . Low back pain     Surgical History: Past Surgical History:  Procedure Laterality Date  . ACHILLES TENDON SURGERY Right 1995  . HERNIA REPAIR  2002   double  . Surgery on Left pinky in 2000      Home Medications:  Allergies as of 02/13/2018   No Known Allergies     Medication List        Accurate as of 02/13/18  9:27 AM. Always use your most recent med list.          Hypodermic Needles-Disposable Misc Commonly known as:  SAFETY-GARD NEEDLE 18G 0.5 mLs by Does not apply route every 14 (fourteen) days.   lisinopril-hydrochlorothiazide 20-25 MG tablet Commonly known as:  PRINZIDE,ZESTORETIC Take 1 tablet by mouth daily.   NEEDLE (DISP) 21 G 21G X 1-1/2" Misc Commonly known as:  BD SAFETYGLIDE SHIELDED NEEDLE 0.5 mLs by Does not apply route every 14 (fourteen) days.   niacin 500 MG tablet Take 500 mg by mouth daily.   pravastatin 40 MG tablet Commonly known as:  PRAVACHOL Take 1 tablet (40 mg total) by mouth daily.   Syringe (Disposable) 3 ML Misc 0.5 mLs by Does not apply route every 14 (fourteen) days.   testosterone cypionate 200 MG/ML injection Commonly known as:  DEPO-TESTOSTERONE Inject 1 mL (200 mg total) into the muscle every 14 (fourteen) days. Inject 0.63mL q14days       Allergies: No Known Allergies  Family History: Family History  Problem Relation Age of Onset  . Hypertension Mother   . Arthritis Mother   . Vision loss Mother        due to age  . Cancer Father        in the liver    Social History:  reports that he has never smoked. He has quit using smokeless tobacco. His smokeless tobacco use included snuff. He reports that he drinks about 1.2 - 2.4 oz of alcohol per week. He reports that he does not use  drugs.  ROS: UROLOGY Frequent Urination?: No Hard to postpone urination?: No Burning/pain  with urination?: No Get up at night to urinate?: No Leakage of urine?: No Urine stream starts and stops?: No Trouble starting stream?: No Do you have to strain to urinate?: No Blood in urine?: No Urinary tract infection?: No Sexually transmitted disease?: No Injury to kidneys or bladder?: No Painful intercourse?: No Weak stream?: No Erection problems?: No Penile pain?: No  Gastrointestinal Nausea?: No Vomiting?: No Indigestion/heartburn?: No Diarrhea?: No Constipation?: No  Constitutional Fever: No Night sweats?: No Weight loss?: No Fatigue?: No  Skin Skin rash/lesions?: No Itching?: No  Eyes Blurred vision?: No Double vision?: No  Ears/Nose/Throat Sore throat?: No Sinus problems?: No  Hematologic/Lymphatic Swollen glands?: No Easy bruising?: No  Cardiovascular Leg swelling?: No Chest pain?: No  Respiratory Cough?: No Shortness of breath?: No  Endocrine Excessive thirst?: No  Musculoskeletal Back pain?: No Joint pain?: No  Neurological Headaches?: No Dizziness?: No  Psychologic Depression?: No Anxiety?: No  Physical Exam: BP (!) 152/81   Pulse 71   Ht 5\' 9"  (1.753 m)   Wt 198 lb (89.8 kg)   BMI 29.24 kg/m   Constitutional: Well nourished. Alert and oriented, No acute distress. HEENT: [place] AT, moist mucus membranes. Trachea midline, no masses. Cardiovascular: No clubbing, cyanosis, or edema. Respiratory: Normal respiratory effort, no increased work of breathing. GI: Abdomen is soft, non tender, non distended, no abdominal masses. Liver and spleen not palpable.  No hernias appreciated.  Stool sample for occult testing is not indicated.   GU: No CVA tenderness.  No bladder fullness or masses.  Patient with circumcised phallus.  Urethral meatus is patent.  No penile discharge. No penile lesions or rashes. Scrotum without lesions, cysts, rashes  and/or edema.  Testicles are located scrotally bilaterally. No masses are appreciated in the testicles. Left and right epididymis are normal. Rectal: Patient with  normal sphincter tone. Anus and perineum without scarring or rashes. No rectal masses are appreciated. Prostate is approximately 50 grams, could only palpate the apex of the prostate,  no nodules are appreciated. Seminal vesicles are normal. Skin: No rashes, bruises or suspicious lesions. Lymph: No cervical or inguinal adenopathy. Neurologic: Grossly intact, no focal deficits, moving all 4 extremities. Psychiatric: Normal mood and affect.  Laboratory Data: Lab Results  Component Value Date   WBC 6.1 01/07/2018   HGB 14.9 01/07/2018   HCT 42.8 01/07/2018   MCV 91 01/07/2018   PLT 158 01/07/2018    Lab Results  Component Value Date   CREATININE 0.93 01/07/2018    Lab Results  Component Value Date   TESTOSTERONE 672 10/21/2017     Lab Results  Component Value Date   TSH 1.490 01/07/2018       Component Value Date/Time   CHOL 176 01/07/2018 0939   HDL 35 (L) 01/07/2018 0939   CHOLHDL 5.0 01/07/2018 0939   LDLCALC 115 (H) 01/07/2018 0939    Lab Results  Component Value Date   AST 20 01/07/2018   Lab Results  Component Value Date   ALT 17 01/07/2018    I have independently reviewed the labs.  Assessment & Plan:    1. Testosterone deficiency  Most recent testosterone level is 672 ng/dL on 10/21/2017 (goal 450-600 ng/dL) Continue testosterone cypionate 200 mg IM,  0.5 cc every 2 weeks Testosterone, HCT and Hbg drawn today - RTC pending lab work results  2. BPH with LUTS IPSS score is 5/0, it is improving Continue conservative management, avoiding bladder irritants and timed voiding's PSA drawn today - RTC  pending lab work results  3. Erectile dysfunction:    SHIM score is 24, it is improved RTC in 6 months for SHIM score and exam, as testosterone therapy can affect erections  Return for pending  blood work results .  These notes generated with voice recognition software. I apologize for typographical errors.  Zara Council, PA-C  [facility] [address] [place], [place] [address] [phone]

## 2018-02-13 ENCOUNTER — Ambulatory Visit (INDEPENDENT_AMBULATORY_CARE_PROVIDER_SITE_OTHER): Payer: Medicare Other | Admitting: Urology

## 2018-02-13 ENCOUNTER — Encounter: Payer: Self-pay | Admitting: Urology

## 2018-02-13 VITALS — BP 152/81 | HR 71 | Ht 69.0 in | Wt 198.0 lb

## 2018-02-13 DIAGNOSIS — N529 Male erectile dysfunction, unspecified: Secondary | ICD-10-CM | POA: Diagnosis not present

## 2018-02-13 DIAGNOSIS — N138 Other obstructive and reflux uropathy: Secondary | ICD-10-CM | POA: Diagnosis not present

## 2018-02-13 DIAGNOSIS — E349 Endocrine disorder, unspecified: Secondary | ICD-10-CM | POA: Diagnosis not present

## 2018-02-13 DIAGNOSIS — N401 Enlarged prostate with lower urinary tract symptoms: Secondary | ICD-10-CM | POA: Diagnosis not present

## 2018-02-14 LAB — PSA: Prostate Specific Ag, Serum: 1.6 ng/mL (ref 0.0–4.0)

## 2018-02-14 LAB — TESTOSTERONE: Testosterone: 314 ng/dL (ref 264–916)

## 2018-02-14 LAB — HEMOGLOBIN: HEMOGLOBIN: 14.9 g/dL (ref 13.0–17.7)

## 2018-02-14 LAB — HEMATOCRIT: Hematocrit: 43.6 % (ref 37.5–51.0)

## 2018-03-31 ENCOUNTER — Other Ambulatory Visit: Payer: Self-pay | Admitting: Family Medicine

## 2018-04-25 ENCOUNTER — Encounter: Payer: Self-pay | Admitting: Family Medicine

## 2018-04-25 DIAGNOSIS — Z23 Encounter for immunization: Secondary | ICD-10-CM | POA: Diagnosis not present

## 2018-05-30 DIAGNOSIS — M67912 Unspecified disorder of synovium and tendon, left shoulder: Secondary | ICD-10-CM | POA: Insufficient documentation

## 2018-06-03 ENCOUNTER — Other Ambulatory Visit: Payer: Self-pay

## 2018-06-03 DIAGNOSIS — E291 Testicular hypofunction: Secondary | ICD-10-CM

## 2018-06-03 NOTE — Telephone Encounter (Signed)
Pt sent request via mychart requesting refill on testosterone, please advise.

## 2018-06-11 ENCOUNTER — Telehealth: Payer: Self-pay

## 2018-06-11 DIAGNOSIS — E291 Testicular hypofunction: Secondary | ICD-10-CM

## 2018-06-11 MED ORDER — TESTOSTERONE CYPIONATE 200 MG/ML IM SOLN: 200.0000 mg | INTRAMUSCULAR | 0 refills | Status: DC

## 2018-06-11 NOTE — Telephone Encounter (Signed)
Left message informing patient to schedule appointment in December for IPPS/SHIM/ and exam and to have lab work done a week prior.  Testosterone rx printed.  Mychart message also sent to patient.

## 2018-06-11 NOTE — Telephone Encounter (Signed)
Pt would like a refill on Testosterone. Please advise.

## 2018-06-11 NOTE — Telephone Encounter (Signed)
Curtis [name] will need to have an office visit in December for I PSS, SHIM and exam.  He will need a testosterone level drawn one week after an injection along with a PSA, HCT and Hgb.  It is okay to refill testosterone medication.

## 2018-06-12 ENCOUNTER — Telehealth: Payer: Self-pay | Admitting: Urology

## 2018-06-12 NOTE — Telephone Encounter (Signed)
[facility] called asking for clarification on the medication we just called in for him. If someone could call t hem back    Thanks, Sharyn Lull

## 2018-06-13 NOTE — Telephone Encounter (Signed)
Spoke to pharmacist and corrected the script. Should have been 0.5Ml Q14 Days

## 2018-06-23 ENCOUNTER — Other Ambulatory Visit: Payer: Self-pay

## 2018-06-23 DIAGNOSIS — N138 Other obstructive and reflux uropathy: Secondary | ICD-10-CM

## 2018-06-23 DIAGNOSIS — E291 Testicular hypofunction: Secondary | ICD-10-CM

## 2018-06-23 DIAGNOSIS — E349 Endocrine disorder, unspecified: Secondary | ICD-10-CM

## 2018-06-23 DIAGNOSIS — N529 Male erectile dysfunction, unspecified: Secondary | ICD-10-CM

## 2018-06-23 DIAGNOSIS — N401 Enlarged prostate with lower urinary tract symptoms: Secondary | ICD-10-CM

## 2018-06-24 ENCOUNTER — Other Ambulatory Visit: Payer: Medicare Other

## 2018-06-24 DIAGNOSIS — N401 Enlarged prostate with lower urinary tract symptoms: Secondary | ICD-10-CM | POA: Diagnosis not present

## 2018-06-24 DIAGNOSIS — N138 Other obstructive and reflux uropathy: Secondary | ICD-10-CM | POA: Diagnosis not present

## 2018-06-24 DIAGNOSIS — N529 Male erectile dysfunction, unspecified: Secondary | ICD-10-CM | POA: Diagnosis not present

## 2018-06-24 DIAGNOSIS — E349 Endocrine disorder, unspecified: Secondary | ICD-10-CM

## 2018-06-24 DIAGNOSIS — E291 Testicular hypofunction: Secondary | ICD-10-CM | POA: Diagnosis not present

## 2018-06-25 LAB — HEMOGLOBIN AND HEMATOCRIT, BLOOD
Hematocrit: 42.7 % (ref 37.5–51.0)
Hemoglobin: 14.5 g/dL (ref 13.0–17.7)

## 2018-06-25 LAB — TESTOSTERONE: TESTOSTERONE: 293 ng/dL (ref 264–916)

## 2018-06-25 LAB — PSA: Prostate Specific Ag, Serum: 1.6 ng/mL (ref 0.0–4.0)

## 2018-07-14 NOTE — Progress Notes (Signed)
07/15/2018 9:21 AM   Curtis [name] [birth year] [id number]  Referring provider: [name], [place], [place] [address]  Chief Complaint  Patient presents with  . Follow-up  . testosterone deficiency   HPI: Patient is a 67 y.o. Caucasian male with testosterone deficiency, BPH with LUTS and ED who presents today for a 6 months follow up. I last saw him on 01/2018 at which time his lower urinary tract symptoms and ED were improving. We restarted his testosterone.  Testosterone deficiency He is still having spontaneous erections at night.  His pretreatment labs were: testosterone 228 ng/dl (01/31/2017) and 262 ng/dL (02/04/2017) with normal LH/FSH and prolactin levels.  He is currently managing his testosterone deficiency with testosterone cypionate 200 mg IM, 0.5 cc q2 weeks. He had to discontinue his injections in 09/2017 due to elevated PSA. Most recent testosterone (05/2018) level is 293 ng/dL (Goal:450-600 ng/dL). HGB and HCT normal (14.5, 42.7%).  He is currently on testosterone. His draw was following his injection, which indicated that 296 is considered a peak. He is concerned that he is not properly injecting on his own and possibly losing some.  BPH WITH LUTS  (prostate and/or bladder) His IPSS score today is 3, it is (prev. 4/0). He is delighted with his quality of life due to his lower urinary tract symptoms. His major complaint today is nocturia (chronic), 1-2x/week occasionally. He denies any dysuria, hematuria or suprapubic pain. He is "milking his prostate" with a device monthly. He also denies any recent fevers, chills, nausea or vomiting.  He does not have a family history of PCa.    IPSS    Row Name 07/15/18 0800         International Prostate Symptom Score   How often have you had the sensation of not emptying your bladder?  Not at All     How often have you had to urinate less than every two hours?  Less than 1 in 5 times     How  often have you found you stopped and started again several times when you urinated?  Not at All     How often have you found it difficult to postpone urination?  Not at All     How often have you had a weak urinary stream?  Less than 1 in 5 times     How often have you had to strain to start urination?  Not at All     How many times did you typically get up at night to urinate?  1 Time     Total IPSS Score  3       Quality of Life due to urinary symptoms   If you were to spend the rest of your life with your urinary condition just the way it is now how would you feel about that?  Delighted       PSA History  1.6 ng/mL in 12/2015  1.4 ng/mL in 12/2016  2.4 ng/mL in 10/2017  1.7 ng/mL in 10/2017     1.6 ng/mL in 01/2018  1.6 ng/mL in 05/2018  Erectile dysfunction His SHIM score is 19, it is stable (prev. 22).  He has been having difficulty with erections for several years. His major complaint is lack of firmness. His libido is diminished.  His risk factors for ED are age, testosterone deficiency, BPH, HTN and HLD.  TSH was 1.49 in 12/2017.   He denies any painful erections or curvatures with his erections.  He is still having spontaneous erections.  He has tried PDE5-inhibitors in the past, but he could not tolerate the side effects.    PMH: Past Medical History:  Diagnosis Date  . HTN (hypertension)   . Hyperlipidemia   . Hypogonadism in male   . Low back pain    Surgical History: Past Surgical History:  Procedure Laterality Date  . ACHILLES TENDON SURGERY Right 1995  . HERNIA REPAIR  2002   double  . Surgery on Left pinky in 2000     Home Medications:  Allergies as of 07/15/2018   No Known Allergies     Medication List       Accurate as of July 15, 2018  9:21 AM. Always use your most recent med list.        Hypodermic Needles-Disposable Misc Commonly known as:  SAFETY-GARD NEEDLE 18G 0.5 mLs by Does not apply route every 14 (fourteen) days.     lisinopril-hydrochlorothiazide 20-25 MG tablet Commonly known as:  PRINZIDE,ZESTORETIC Take 1 tablet by mouth daily.   NEEDLE (DISP) 21 G 21G X 1-1/2" Misc Commonly known as:  BD SAFETYGLIDE SHIELDED NEEDLE 0.5 mLs by Does not apply route every 14 (fourteen) days.   niacin 500 MG tablet Take 500 mg by mouth daily.   pravastatin 40 MG tablet Commonly known as:  PRAVACHOL TAKE 1 TABLET BY MOUTH ONCE DAILY   Syringe (Disposable) 3 ML Misc 0.5 mLs by Does not apply route every 14 (fourteen) days.   testosterone cypionate 200 MG/ML injection Commonly known as:  DEPO-TESTOSTERONE Inject 1 mL (200 mg total) into the muscle every 14 (fourteen) days. Inject 0.21mL q14days      Allergies: No Known Allergies  Family History: Family History  Problem Relation Age of Onset  . Hypertension Mother   . Arthritis Mother   . Vision loss Mother        due to age  . Cancer Father        in the liver   Social History:  reports that he has never smoked. He has quit using smokeless tobacco.  His smokeless tobacco use included snuff. He reports current alcohol use of about 2.0 - 4.0 standard drinks of alcohol per week. He reports that he does not use drugs.  ROS: UROLOGY Frequent Urination?: No Hard to postpone urination?: No Burning/pain with urination?: No Get up at night to urinate?: Yes Leakage of urine?: No Urine stream starts and stops?: No Trouble starting stream?: No Do you have to strain to urinate?: No Blood in urine?: No Urinary tract infection?: No Sexually transmitted disease?: No Injury to kidneys or bladder?: No Painful intercourse?: No Weak stream?: No Erection problems?: Yes Penile pain?: No  Gastrointestinal Nausea?: No Vomiting?: No Indigestion/heartburn?: No Diarrhea?: No Constipation?: No  Constitutional Fever: No Night sweats?: No Weight loss?: No Fatigue?: No  Skin Skin rash/lesions?: No Itching?: No  Eyes Blurred vision?:  No  Ears/Nose/Throat Sore throat?: No Sinus problems?: No  Hematologic/Lymphatic Swollen glands?: No Easy bruising?: No  Cardiovascular Leg swelling?: No Chest pain?: No  Respiratory Cough?: No Shortness of breath?: No  Endocrine Excessive thirst?: No  Musculoskeletal Back pain?: Yes Joint pain?: No  Neurological Headaches?: No Dizziness?: No  Psychologic Depression?: No Anxiety?: No  Physical Exam: BP (!) 187/84 (BP Location: Left Arm, Patient Position: Sitting, Cuff Size: Normal)   Pulse 74   Ht 5\' 9"  (1.753 m)   Wt 211 lb 1.6 oz (95.8 kg)   BMI 31.17 kg/m  Constitutional: Well nourished. Alert and oriented, No acute distress. Respiratory: Normal respiratory effort, no increased work of breathing. GU: No CVA tenderness. No bladder fullness or masses.  Patient with circumcised phallus.  Urethral meatus is patent.  No penile discharge. No penile lesions or rashes. Scrotum without lesions, cysts, rashes and/or edema. Testicles are located scrotally bilaterally. No masses are appreciated in the testicles. Left and right epididymis are normal. Rectal: Patient with  normal sphincter tone. Anus and perineum without scarring or rashes. No rectal masses are appreciated. Prostate is approximately 50 grams, could only palpate the apex and midportion of the prostate, no nodules are appreciated. Skin: No rashes, bruises or suspicious lesions. Neurologic: Grossly intact, no focal deficits, moving all 4 extremities. Psychiatric: Normal mood and affect.  Laboratory Data: Lab Results  Component Value Date   WBC 6.1 01/07/2018   HGB 14.5 06/24/2018   HCT 42.7 06/24/2018   MCV 91 01/07/2018   PLT 158 01/07/2018   Lab Results  Component Value Date   CREATININE 0.93 01/07/2018   Lab Results  Component Value Date   TESTOSTERONE 293 06/24/2018   Lab Results  Component Value Date   TSH 1.490 01/07/2018      Component Value Date/Time   CHOL 176 01/07/2018 0939   HDL  35 (L) 01/07/2018 0939   CHOLHDL 5.0 01/07/2018 0939   LDLCALC 115 (H) 01/07/2018 0939   Lab Results  Component Value Date   AST 20 01/07/2018   Lab Results  Component Value Date   ALT 17 01/07/2018   I have independently reviewed the labs.  Assessment & Plan:    1. Testosterone deficiency  Most recent testosterone (05/2018) level is 293 ng/dL - goal 450-600 ng/dL Continue testosterone cypionate 200 mg IM,  0.5 cc every 2 weeks Advised patient on proper way to administer his IM injections. We discussed preferable muscle groups for injection sites. Also recommended he have his wife do it for him if he continues having difficulties. Recheck levels following next injection  2. BPH with LUTS Recent PSA was 1.6, it is stable IPSS score is 3, it is stable Continue conservative management, avoiding bladder irritants and timed voiding's  3. Erectile dysfunction:    SHIM score is 19, it is stable (prev. 24) RTC in 6 months for SHIM score and exam, as testosterone therapy can affect erections  Return in about 2 months (around 09/15/2018) for testosterone level.  Laneta Simmers  [place] [address] [place], [place] [address] [phone]  I, Temidayo Atanda-Ogunleye , am acting as a scribe for Nori Riis, PA-C  I have reviewed the above documentation for accuracy and completeness, and I agree with the above.    Zara Council, PA-C

## 2018-07-15 ENCOUNTER — Encounter: Payer: Self-pay | Admitting: Family Medicine

## 2018-07-15 ENCOUNTER — Ambulatory Visit (INDEPENDENT_AMBULATORY_CARE_PROVIDER_SITE_OTHER): Payer: Medicare Other | Admitting: Urology

## 2018-07-15 ENCOUNTER — Encounter: Payer: Self-pay | Admitting: Urology

## 2018-07-15 ENCOUNTER — Ambulatory Visit (INDEPENDENT_AMBULATORY_CARE_PROVIDER_SITE_OTHER): Payer: Medicare Other | Admitting: Family Medicine

## 2018-07-15 VITALS — BP 124/70 | HR 80 | Temp 98.3°F | Resp 16 | Wt 211.0 lb

## 2018-07-15 VITALS — BP 187/84 | HR 74 | Ht 69.0 in | Wt 211.1 lb

## 2018-07-15 DIAGNOSIS — N529 Male erectile dysfunction, unspecified: Secondary | ICD-10-CM | POA: Diagnosis not present

## 2018-07-15 DIAGNOSIS — E349 Endocrine disorder, unspecified: Secondary | ICD-10-CM

## 2018-07-15 DIAGNOSIS — G8929 Other chronic pain: Secondary | ICD-10-CM

## 2018-07-15 DIAGNOSIS — M545 Low back pain, unspecified: Secondary | ICD-10-CM

## 2018-07-15 DIAGNOSIS — N138 Other obstructive and reflux uropathy: Secondary | ICD-10-CM

## 2018-07-15 DIAGNOSIS — M25512 Pain in left shoulder: Secondary | ICD-10-CM | POA: Diagnosis not present

## 2018-07-15 DIAGNOSIS — N401 Enlarged prostate with lower urinary tract symptoms: Secondary | ICD-10-CM | POA: Diagnosis not present

## 2018-07-15 DIAGNOSIS — M79645 Pain in left finger(s): Secondary | ICD-10-CM

## 2018-07-15 NOTE — Progress Notes (Signed)
Patient: Curtis [name] Male    DOB: 1951-07-07   67 y.o.   MRN: [id number] Visit Date: 07/15/2018  Today's Provider: [name], PA   Chief Complaint  Patient presents with  . Shoulder Pain   Subjective:     Shoulder Pain   The pain is present in the left shoulder. This is a new problem. The current episode started 1 to 4 weeks ago (3 weeks). The problem occurs daily. The problem has been unchanged. The quality of the pain is described as aching. The pain is at a severity of 3/10. Associated symptoms include an inability to bear weight, a limited range of motion and stiffness. Pertinent negatives include no fever, itching, joint locking, joint swelling, numbness or tingling. The symptoms are aggravated by lying down and activity. He has tried NSAIDS (icey hot and Aleve) for the symptoms. The treatment provided mild relief.   Patient states around the first of November 2019, he was using a manual push BushHog. Patient states his left shoulder has hurt since then. Patient states pain is usually mild but can become moderate with certain activities. Patient states left shoulder will get stiff and will sometimes pop. Patient has been using Aleve and [place] with mild relief.   Back Pain Having low back pains intermittently since college days. Initial injury occurred while dead lifting weights at age 82 and felt a sudden pain with numbness. Resolve in a few minutes and has not had a recurrence of the numbness. No weakness in extremities and pain described as an ache with strenuous activities now. Some relief with Aleve and hot Epsom Salt baths.  Finger Pain Has a screw put in the left 5th finger in 2000 secondary to a dislocation/fracture injury to the PIP joint. Feels the joint is enlarging and having pain with bumps or squeezing it against his wedding band. Worried the screw may be coming out with the joint enlarging and having pain.  Past Medical History:  Diagnosis Date    . HTN (hypertension)   . Hyperlipidemia   . Hypogonadism in male   . Low back pain    Past Surgical History:  Procedure Laterality Date  . ACHILLES TENDON SURGERY Right 1995  . HERNIA REPAIR  2002   double  . Surgery on Left pinky in 2000     Family History  Problem Relation Age of Onset  . Hypertension Mother   . Arthritis Mother   . Vision loss Mother        due to age  . Cancer Father        in the liver   No Known Allergies  Current Outpatient Medications:  .  Hypodermic Needles-Disposable (SAFETY-GARD NEEDLE 18G) MISC, 0.5 mLs by Does not apply route every 14 (fourteen) days., Disp: 30 each, Rfl: 0 .  lisinopril-hydrochlorothiazide (PRINZIDE,ZESTORETIC) 20-25 MG tablet, Take 1 tablet by mouth daily., Disp: 90 tablet, Rfl: 3 .  NEEDLE, DISP, 21 G (BD SAFETYGLIDE SHIELDED NEEDLE) 21G X 1-1/2" MISC, 0.5 mLs by Does not apply route every 14 (fourteen) days., Disp: 30 each, Rfl: 0 .  niacin 500 MG tablet, Take 500 mg by mouth daily., Disp: , Rfl:  .  pravastatin (PRAVACHOL) 40 MG tablet, TAKE 1 TABLET BY MOUTH ONCE DAILY, Disp: 90 tablet, Rfl: 3 .  Syringe, Disposable, 3 ML MISC, 0.5 mLs by Does not apply route every 14 (fourteen) days., Disp: 30 each, Rfl: 0 .  testosterone cypionate (DEPO-TESTOSTERONE) 200 MG/ML  injection, Inject 1 mL (200 mg total) into the muscle every 14 (fourteen) days. Inject 0.22mL q14days, Disp: 10 mL, Rfl: 0  Review of Systems  Constitutional: Negative for appetite change, chills and fever.  Respiratory: Negative for chest tightness, shortness of breath and wheezing.   Cardiovascular: Negative for chest pain and palpitations.  Gastrointestinal: Negative for abdominal pain, nausea and vomiting.  Musculoskeletal: Positive for stiffness.  Skin: Negative for itching.  Neurological: Negative for tingling and numbness.    Social History   Tobacco Use  . Smoking status: Never Smoker  . Smokeless tobacco: Former Systems developer    Types: Snuff  . Tobacco  comment: quit snuff at age 63  Substance Use Topics  . Alcohol use: Yes    Alcohol/week: 2.0 - 4.0 standard drinks    Types: 2 - 4 Cans of beer per week     Objective:   BP 124/70 (BP Location: Right Arm, Patient Position: Sitting, Cuff Size: Large)   Pulse 80   Temp 98.3 F (36.8 C) (Oral)   Resp 16   Wt 211 lb (95.7 kg)   SpO2 97%   BMI 31.16 kg/m  Vitals:   07/15/18 1618  BP: 124/70  Pulse: 80  Resp: 16  Temp: 98.3 F (36.8 C)  TempSrc: Oral  SpO2: 97%  Weight: 211 lb (95.7 kg)   Physical Exam Constitutional:      General: He is not in acute distress.    Appearance: He is well-developed.  HENT:     Head: Normocephalic and atraumatic.     Right Ear: Hearing normal.     Left Ear: Hearing normal.     Nose: Nose normal.  Eyes:     General: Lids are normal. No scleral icterus.       Right eye: No discharge.        Left eye: No discharge.     Conjunctiva/sclera: Conjunctivae normal.  Pulmonary:     Effort: Pulmonary effort is normal. No respiratory distress.  Musculoskeletal:        General: Tenderness and deformity present.     Comments: Deformity of the left 5th finger PIP joint with history of surgical fixation with a "screw". Left shoulder pain to abduct the humerus and elevate to shoulder level. Increased pain to rotate left arm internally and externally with the humerus extended laterally at shoulder level. More pain to try to "tuck the shirt tail in or put a coat on". Some tenderness over the right and left SI joints. Fair ROM of spine without radiation of pains to extremities.  Skin:    Findings: No lesion or rash.  Neurological:     Mental Status: He is alert and oriented to person, place, and time.  Psychiatric:        Speech: Speech normal.        Behavior: Behavior normal.        Thought Content: Thought content normal.       Assessment & Plan    1. Chronic left shoulder pain Onset 6 weeks ago after using a walk behind bush hog to clear some land.  Having pain in the left shoulder joint with certain movements described as a deep ache that will also wake him from sleep if he lies on the left shoulder. May use Aleve, moist heat or Icy-Hot with Lidocaine. Will get x-ray of shoulder to rule out degenerative joint disease. May need MRI to rule out rotator cuff tear or orthopedic referral if x-ray negative  and no better with this regimen. - DG Shoulder Left  2. Chronic midline low back pain without sciatica Intermittent chronic low back pain without radiation. Suspect arthritic degeneration. Will get L-S spine films and may continue NSAID with hot Epsom Salt soaks. Also, consider ice pack prn. Recheck pending x-ray report. - DG Lumbar Spine Complete  3. Finger pain, left History of surgical fixation with a screw in the left 5th finger PIP joint. Will check x-ray to be sure hardware is not loose or backing out. May need hand surgeon referral. - DG Finger Little Left     [name], [facility]

## 2018-07-16 ENCOUNTER — Ambulatory Visit
Admission: RE | Admit: 2018-07-16 | Discharge: 2018-07-16 | Disposition: A | Discharge status: Home / Self Care | Payer: Medicare Other | Source: Ambulatory Visit | Attending: Family Medicine | Admitting: Family Medicine

## 2018-07-16 ENCOUNTER — Ambulatory Visit
Admission: RE | Admit: 2018-07-16 | Discharge: 2018-07-16 | Disposition: A | Discharge status: Home / Self Care | Payer: Medicare Other | Attending: Family Medicine | Admitting: Family Medicine

## 2018-07-16 DIAGNOSIS — M25519 Pain in unspecified shoulder: Secondary | ICD-10-CM | POA: Diagnosis not present

## 2018-07-16 DIAGNOSIS — M25512 Pain in left shoulder: Secondary | ICD-10-CM | POA: Insufficient documentation

## 2018-07-16 DIAGNOSIS — M79645 Pain in left finger(s): Secondary | ICD-10-CM | POA: Diagnosis not present

## 2018-07-16 DIAGNOSIS — M47816 Spondylosis without myelopathy or radiculopathy, lumbar region: Secondary | ICD-10-CM | POA: Diagnosis not present

## 2018-07-16 DIAGNOSIS — G8929 Other chronic pain: Secondary | ICD-10-CM | POA: Diagnosis not present

## 2018-07-16 DIAGNOSIS — M19042 Primary osteoarthritis, left hand: Secondary | ICD-10-CM | POA: Diagnosis not present

## 2018-07-16 DIAGNOSIS — M545 Low back pain: Secondary | ICD-10-CM | POA: Diagnosis not present

## 2018-07-16 DIAGNOSIS — M19012 Primary osteoarthritis, left shoulder: Secondary | ICD-10-CM | POA: Diagnosis not present

## 2018-07-16 IMAGING — CR DG FINGER LITTLE 2+V*L*
1 series · 3 of 3 positions shown · non-contrast
Comparison: None

CLINICAL DATA: LEFT little finger pain, history of fracture and
surgery

EXAM:
LEFT LITTLE FINGER 2+V

[Series 1: dg finger little left · 0.14mm/px · 3 of 3 slices shown]
[im 1/3]
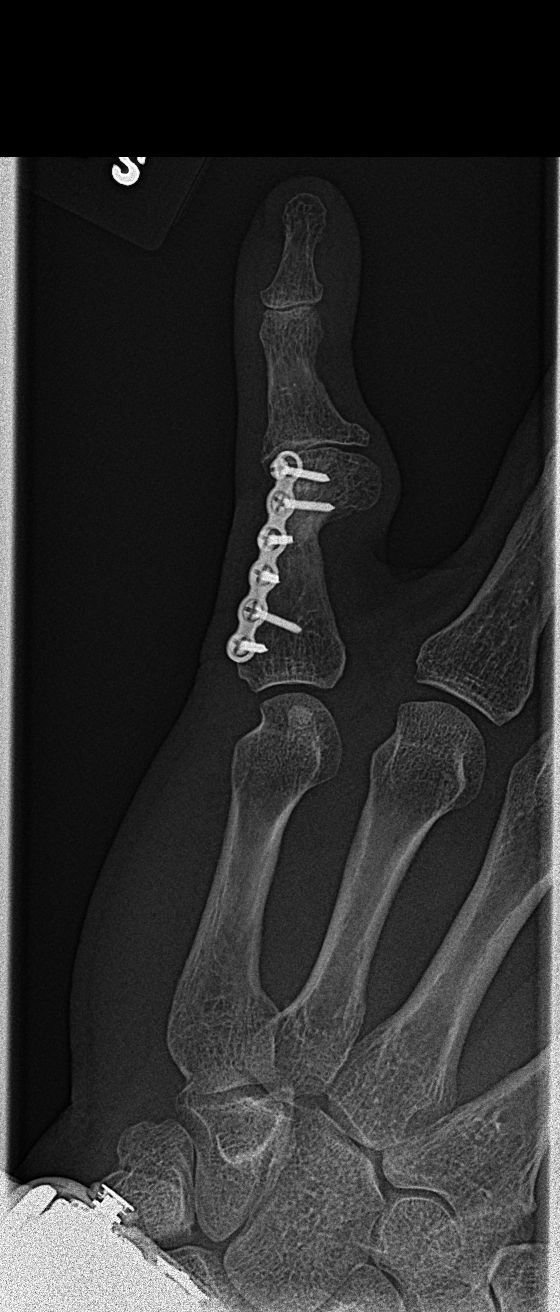
[im 2/3]
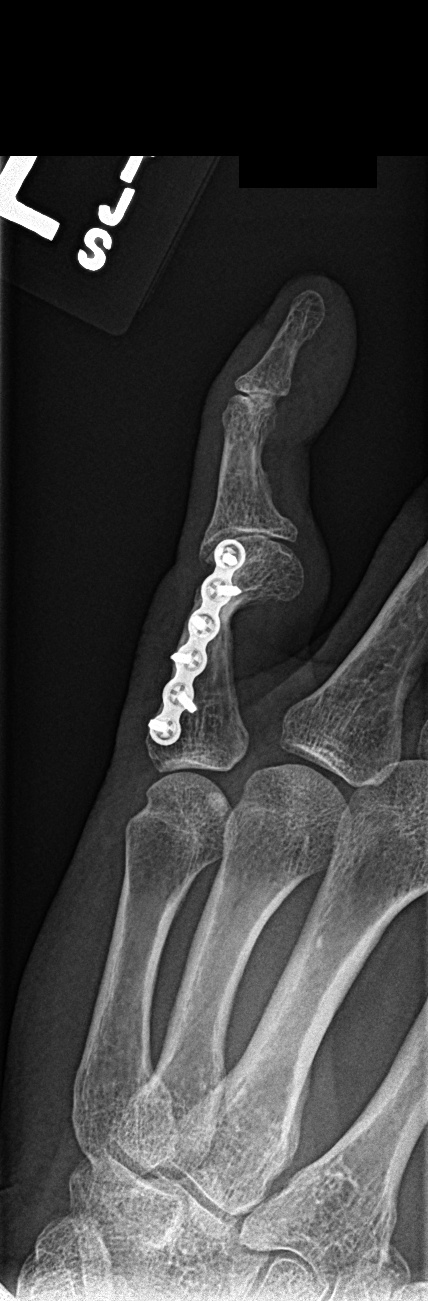
[im 3/3]
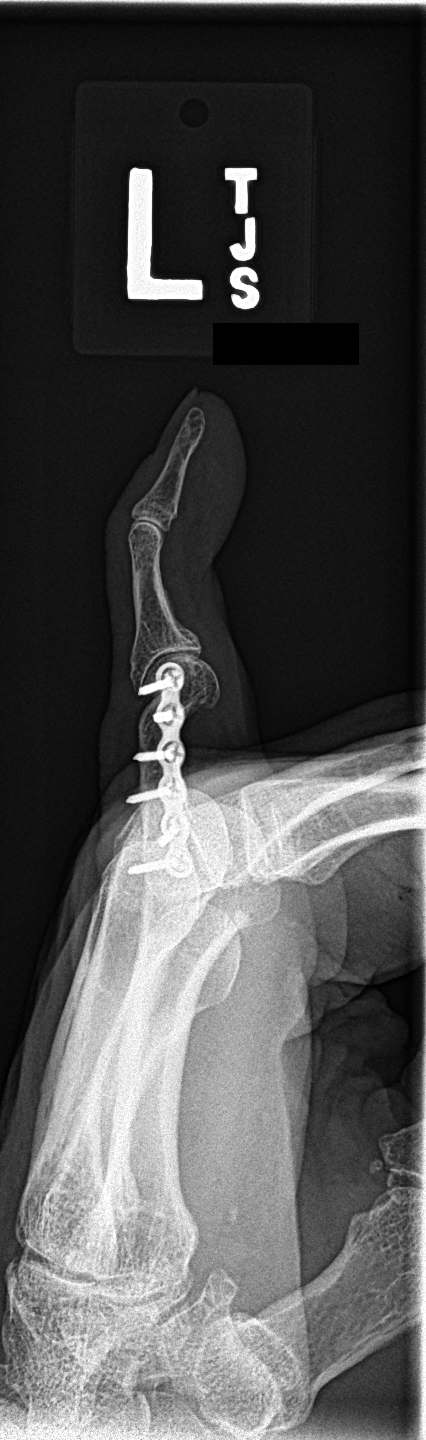

[3 of 3 positions shown; findings below may reference images not displayed]

FINDINGS: Osseous demineralization.

Plate and 6 screws identified at proximal phalanx LEFT little
finger.

Deformity of the head of the proximal phalanx with secondary
degenerative changes of the PIP joint.

No acute fracture, dislocation, or bone destruction.
IMPRESSION: Posttraumatic and postsurgical changes of the proximal phalanx LEFT
little finger with secondary degenerative changes of the PIP joint.

## 2018-07-16 IMAGING — CR DG LUMBAR SPINE COMPLETE 4+V
1 series · 5 of 5 positions shown · non-contrast
Comparison: None

CLINICAL DATA: Intermittent chronic low back pain, football injury
40 years ago, midline lower back pain without sciatica

EXAM:
LUMBAR SPINE - COMPLETE 4+ VIEW

[Series 1: dg lumbar spine complete 4 +v · 0.14mm/px · 5 of 5 slices shown]
[im 1/5]
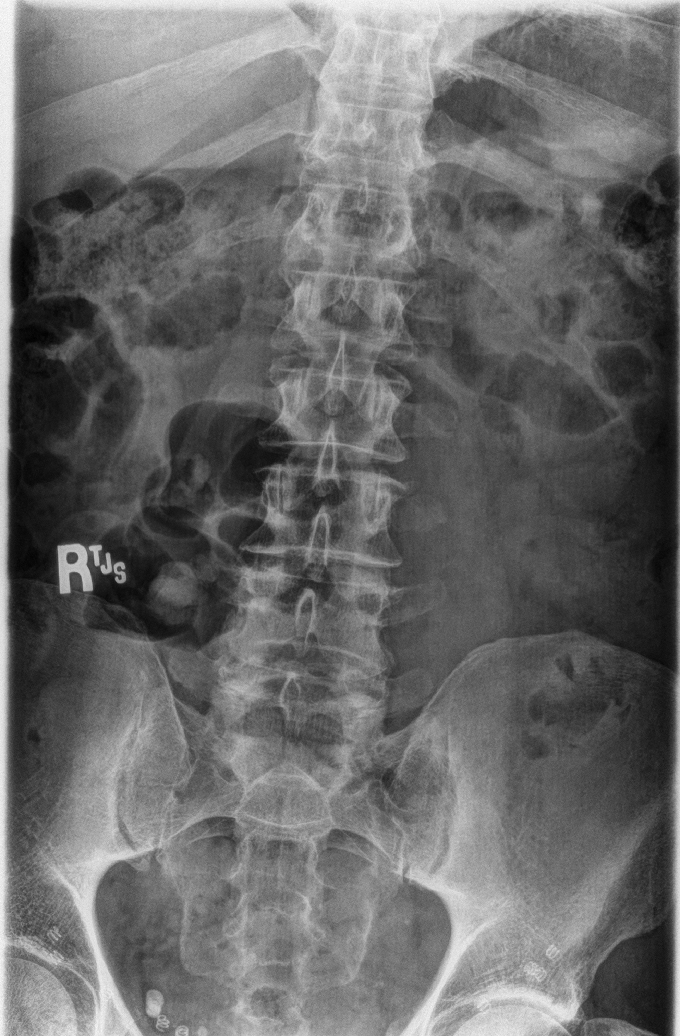
[im 2/5]
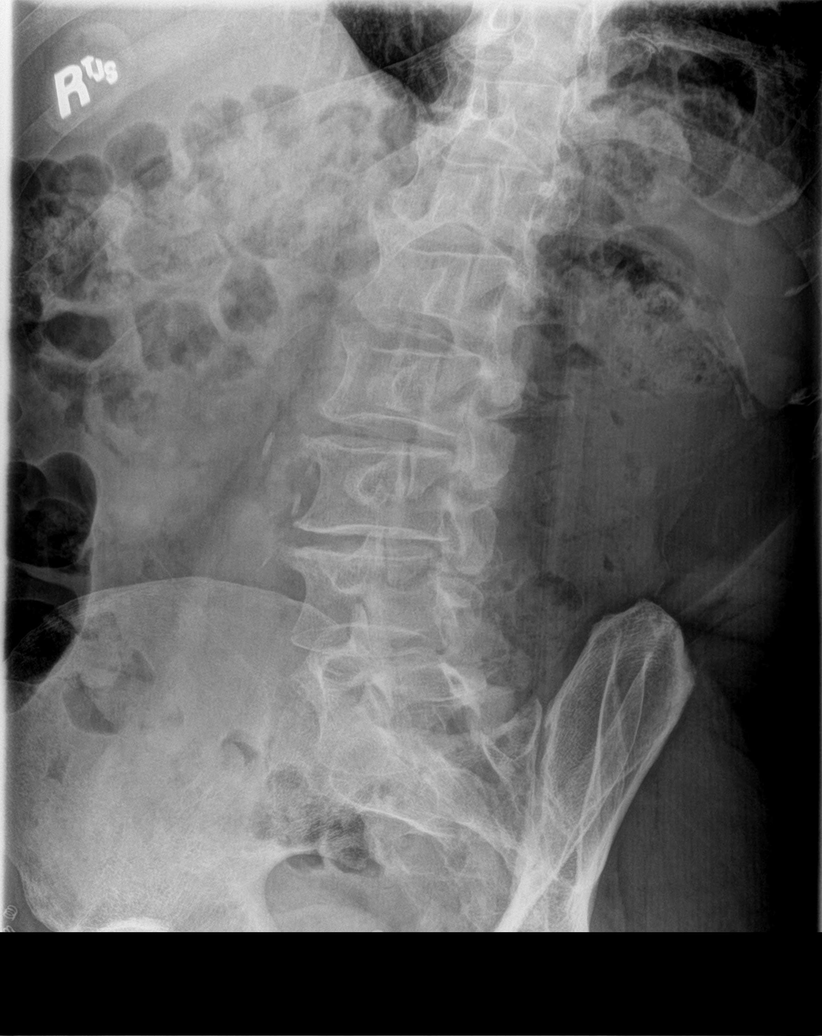
[im 3/5]
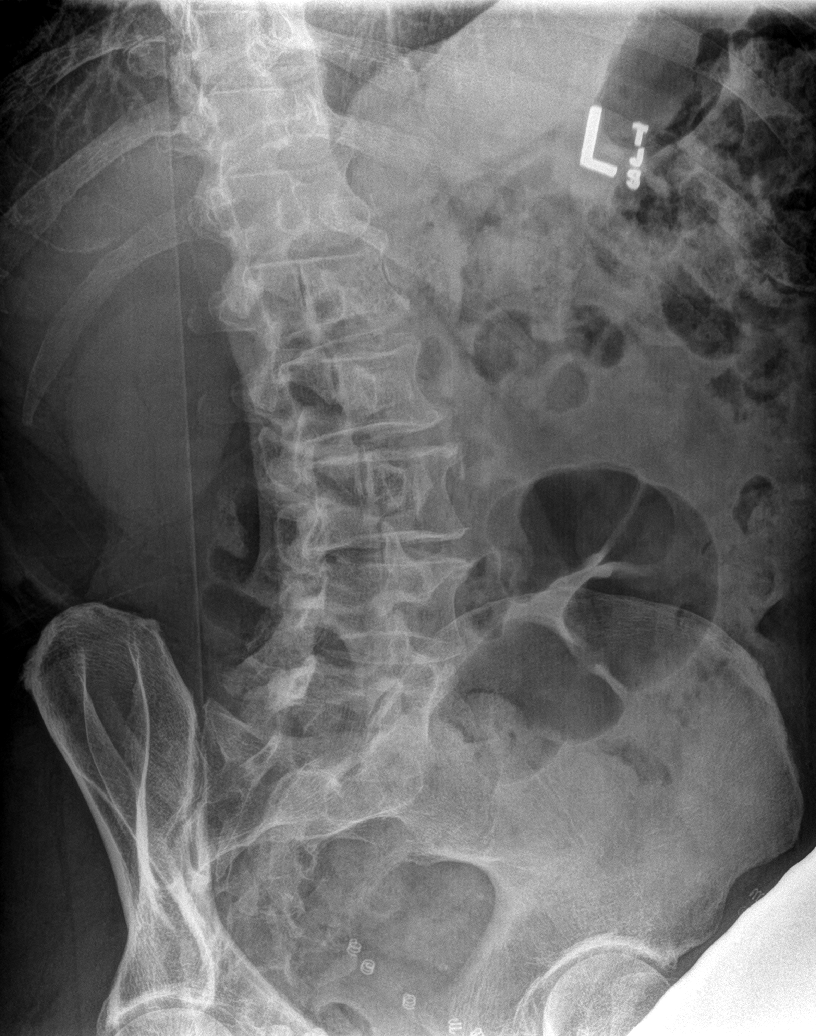
[im 4/5]
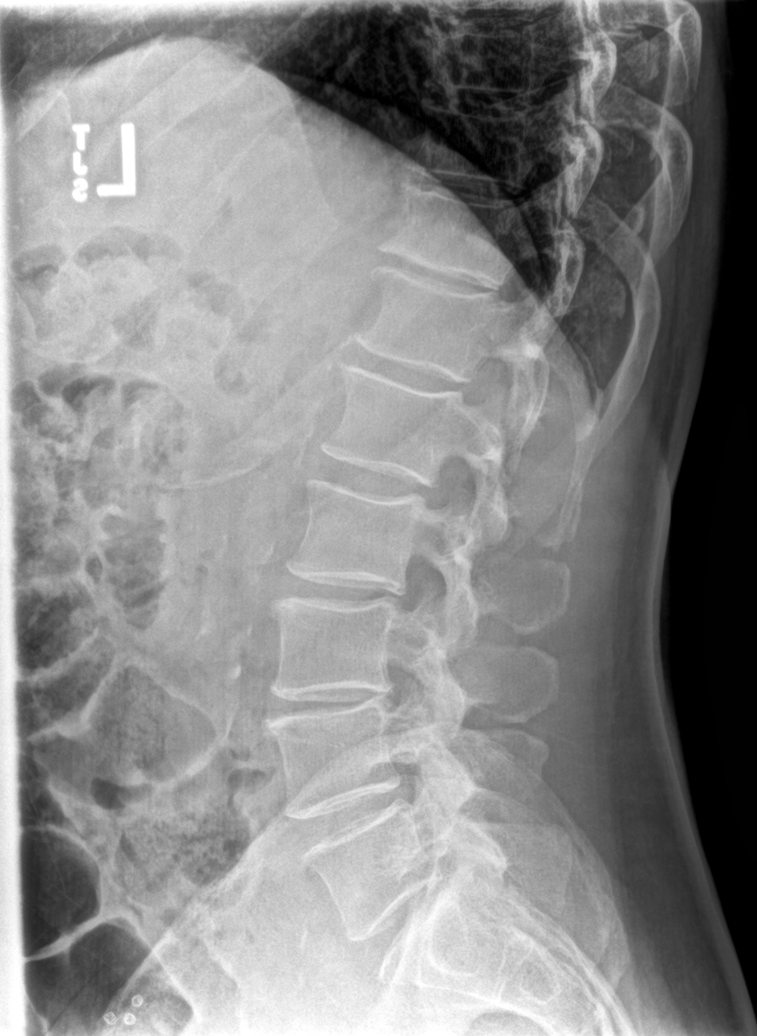
[im 5/5]
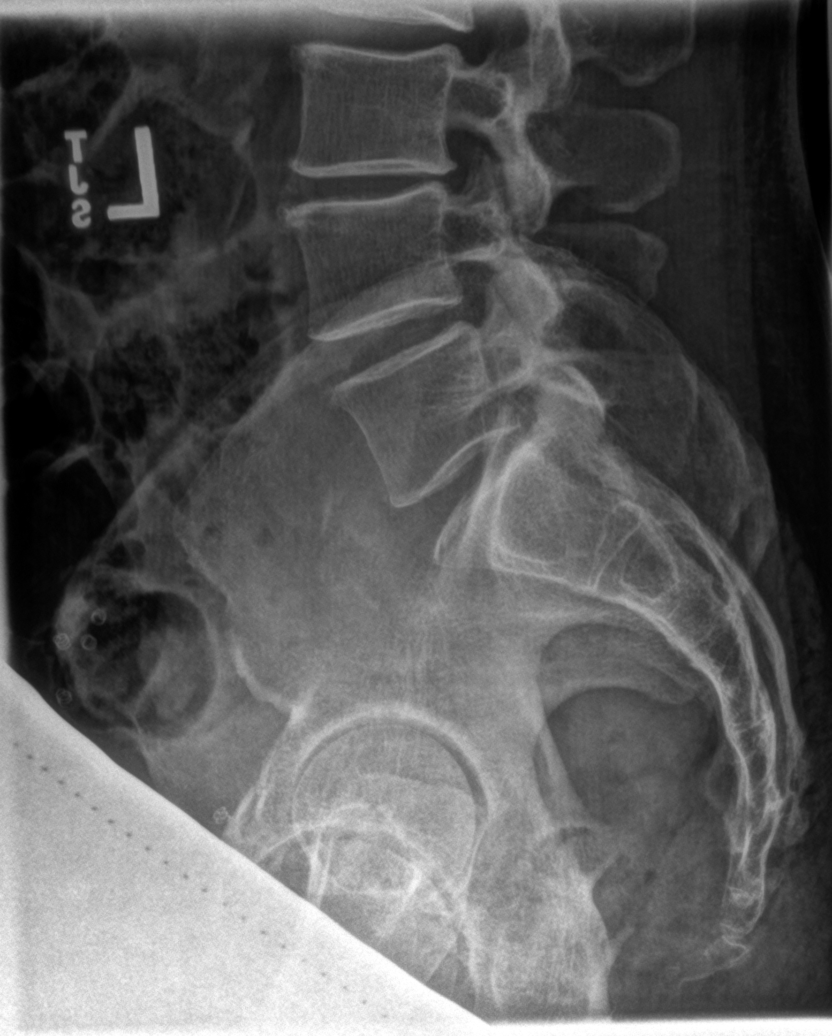

[5 of 5 positions shown; findings below may reference images not displayed]

FINDINGS: Mild osseous demineralization.

Five non-rib-bearing lumbar vertebra.

Disc space narrowing L3-L4, to lesser degrees at L2-L3 and L4-L5.

Vertebral body heights maintained without fracture or subluxation.

No bone destruction or spondylolysis.

SI joints preserved.

Pelvic phleboliths and question prior BILATERAL inguinal hernia
repairs.
IMPRESSION: Mild degenerative disc disease changes lumbar spine.

## 2018-07-16 IMAGING — CR DG SHOULDER 2+V*L*
1 series · 3 of 3 positions shown · non-contrast
Comparison: None

CLINICAL DATA: Pain for 6 weeks after using a blush hog

EXAM:
LEFT SHOULDER - 2+ VIEW

[Series 1: dg shoulder left · 0.14mm/px · 3 of 3 slices shown]
[im 1/3]
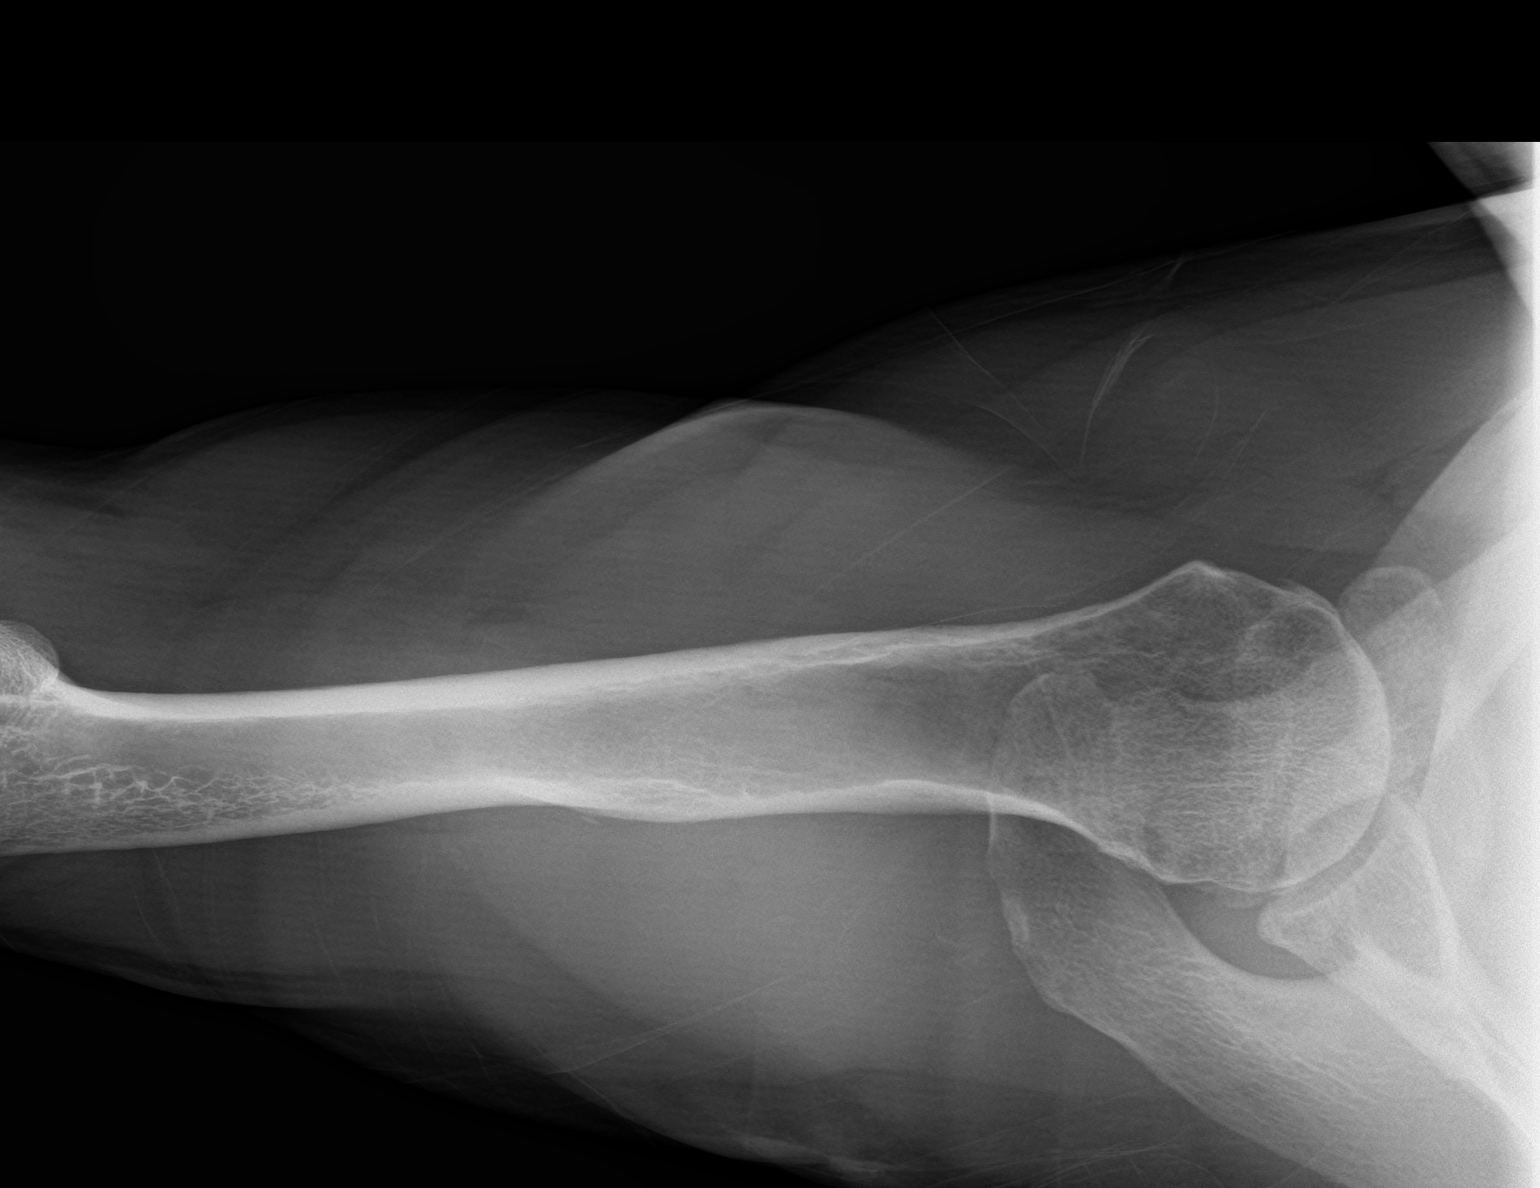
[im 2/3]
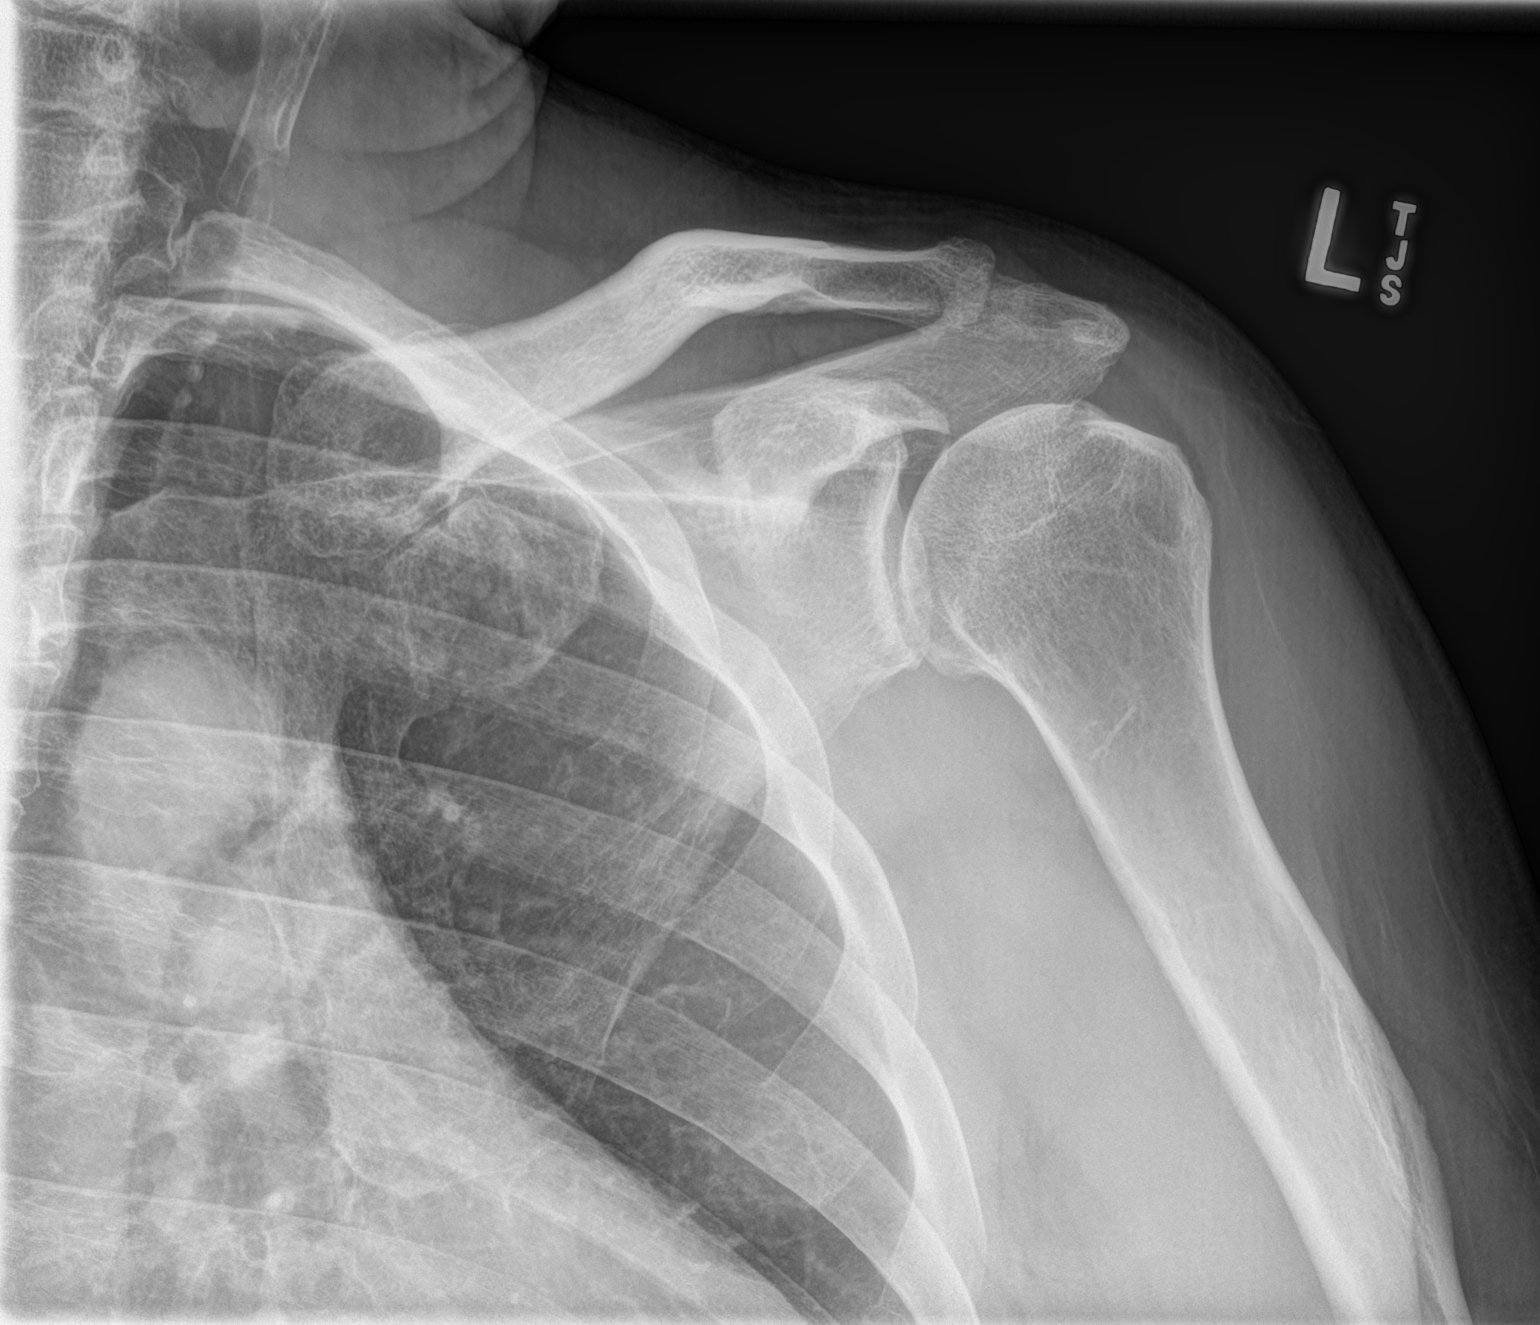
[im 3/3]
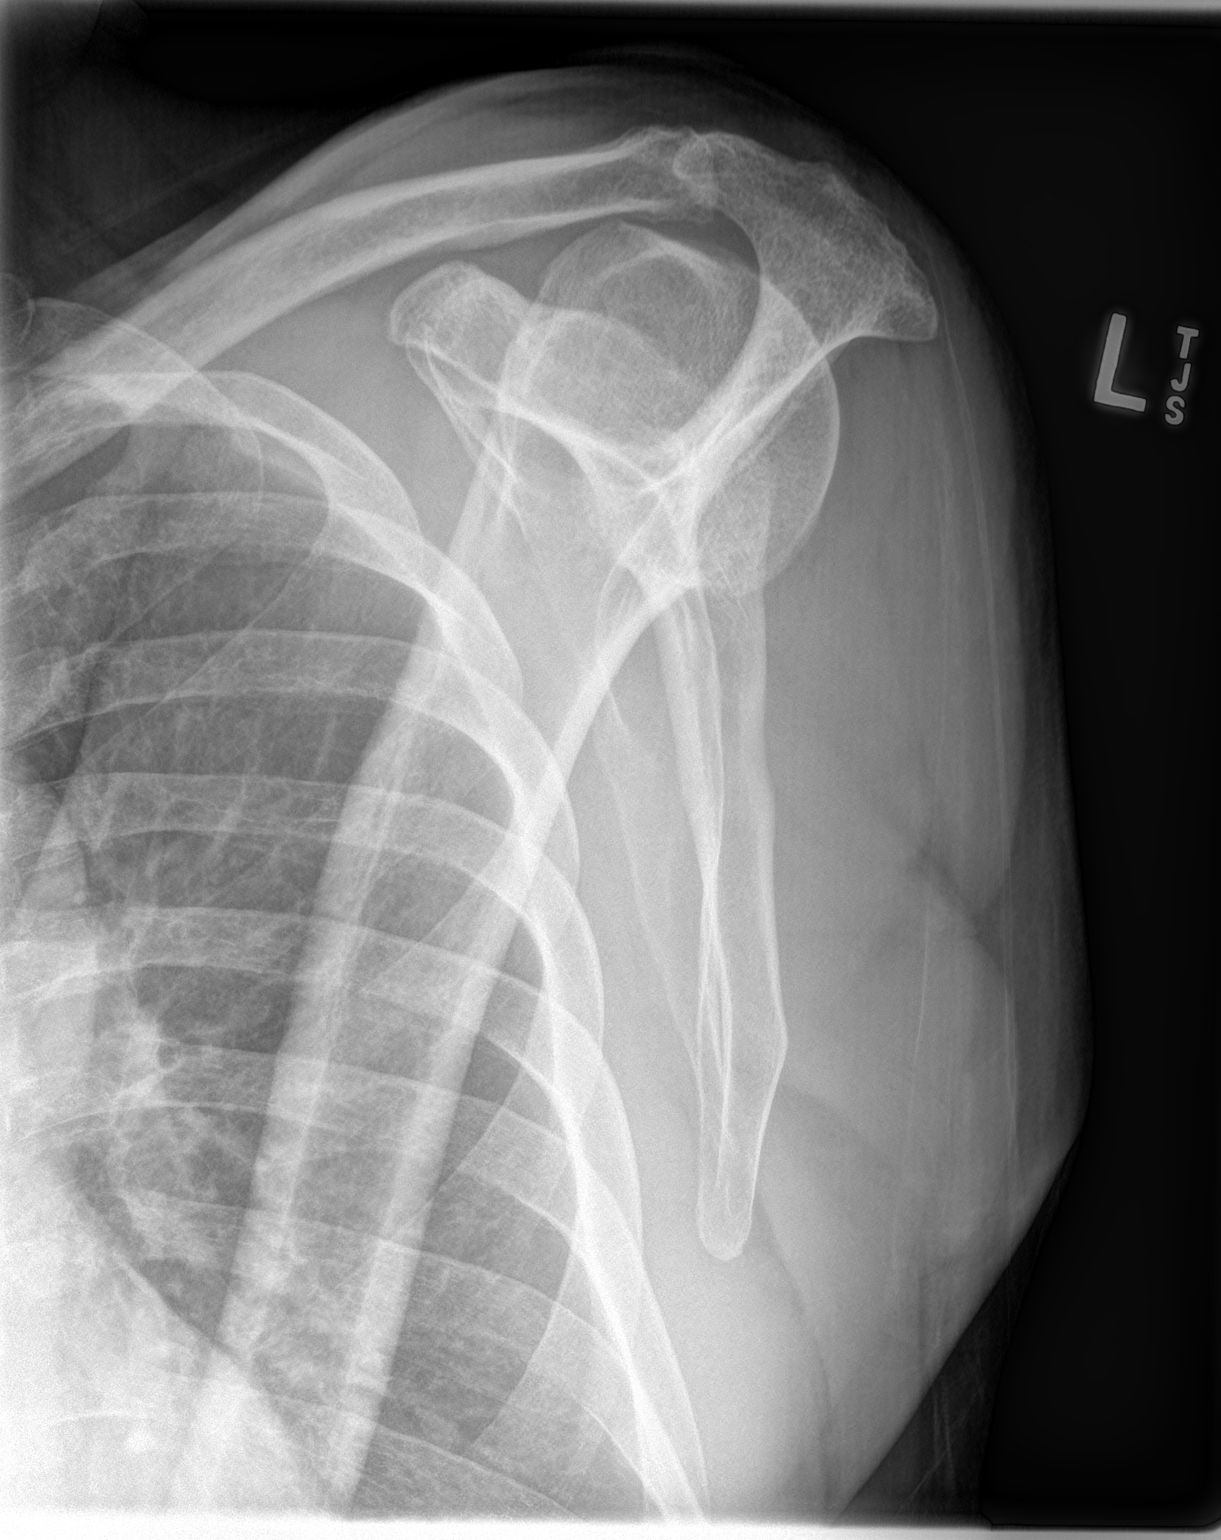

[3 of 3 positions shown; findings below may reference images not displayed]

FINDINGS: Mild osseous demineralization.

AC joint alignment normal.

Glenohumeral joint space narrowing.

No acute fracture, dislocation, or bone destruction.

Visualized LEFT ribs intact.
IMPRESSION: Mild LEFT glenohumeral degenerative changes.

## 2018-07-17 ENCOUNTER — Encounter: Payer: Self-pay | Admitting: Family Medicine

## 2018-07-18 ENCOUNTER — Telehealth: Payer: Self-pay | Admitting: *Deleted

## 2018-07-18 ENCOUNTER — Telehealth: Payer: Self-pay

## 2018-07-18 NOTE — Telephone Encounter (Signed)
Pt returned missed call. Please call pt back in the next 2 hours if possible.  Thanks, [employer]

## 2018-07-18 NOTE — Telephone Encounter (Signed)
LMOVM for pt to return call 

## 2018-07-18 NOTE — Telephone Encounter (Signed)
Patient wanted to know if Curtis [name] thinks he would benefit from physical therapy for back or shoulder problems? Please advise?

## 2018-07-18 NOTE — Telephone Encounter (Signed)
-----   Message from [name], [place] sent at 07/17/2018  2:43 PM EST ----- Only mild degenerative/arthritic changes in lumbar disks. No displacement or shift in vertebrae. Left shoulder also has degenerative changes. Should consider referral to orthopedist for possible cortisone injections if no better after 10-14 day use of the Aleve or Advil daily.

## 2018-07-18 NOTE — Telephone Encounter (Signed)
Once the anti-inflammatory treatment is started, the physical therapy is an option to try. Can place the order anytime.

## 2018-07-18 NOTE — Telephone Encounter (Signed)
Patient was advised.  

## 2018-07-18 NOTE — Telephone Encounter (Addendum)
Patient was advised. Patient stated he will call us back when he's ready.

## 2018-07-18 NOTE — Telephone Encounter (Signed)
[id number] 

## 2018-08-18 ENCOUNTER — Encounter: Payer: Self-pay | Admitting: Family Medicine

## 2018-08-28 ENCOUNTER — Telehealth: Payer: Self-pay | Admitting: Family Medicine

## 2018-08-28 NOTE — Telephone Encounter (Signed)
Curtis [name] with  [facility] Fax [phone] / phone [phone]  Needing an order from [facility] for pt to have PT orders for left shoulder PT. Pt has an appt on Mon at 8 am to be seen he will need orders to be seen.  Thanks, [employer]

## 2018-08-28 NOTE — Telephone Encounter (Signed)
May proceed with PT for chronic left shoulder pain evaluation and treatment.

## 2018-08-29 NOTE — Telephone Encounter (Signed)
L/M advising Curtis [name] as below. Also advised to call back if we needed to fax an order as well.

## 2018-09-01 ENCOUNTER — Telehealth: Payer: Self-pay

## 2018-09-01 NOTE — Telephone Encounter (Signed)
Order was faxed.

## 2018-09-01 NOTE — Telephone Encounter (Signed)
Curtis [name] w/ [name] called and stated that she needs a hardcopy/ written order for Curtis [name] Pt order. The order can be faxed to [phone] attention [facility].

## 2018-09-01 NOTE — Telephone Encounter (Signed)
Order written and may be faxed to the attention of [facility] with [facility] PT evaluation and treatment for chronic left shoulder pain.

## 2018-09-12 ENCOUNTER — Other Ambulatory Visit: Payer: Self-pay

## 2018-09-12 DIAGNOSIS — E349 Endocrine disorder, unspecified: Secondary | ICD-10-CM

## 2018-09-15 ENCOUNTER — Other Ambulatory Visit: Payer: Medicare Other

## 2018-09-15 DIAGNOSIS — E349 Endocrine disorder, unspecified: Secondary | ICD-10-CM

## 2018-09-16 ENCOUNTER — Telehealth: Payer: Self-pay

## 2018-09-16 DIAGNOSIS — E349 Endocrine disorder, unspecified: Secondary | ICD-10-CM

## 2018-09-16 LAB — TESTOSTERONE: TESTOSTERONE: 431 ng/dL (ref 264–916)

## 2018-09-16 NOTE — Telephone Encounter (Signed)
-----   Message from Nori Riis, PA-C sent at 09/16/2018  7:54 AM EST ----- Please let Mr. Ploeger know that his testosterone is within therapeutic levels.  He needs to continue his present dose of testosterone cypionate 200mg /cc, 0.5 cc every two weeks.  We will need to see him in 3 months for labs only (testosterone -one week after injection, HCT and Hgb).

## 2018-09-16 NOTE — Telephone Encounter (Signed)
Called pt informed him of the information below pt gave verbal understanding. Lab orders placed, lab appt scheduled

## 2018-11-13 ENCOUNTER — Other Ambulatory Visit: Payer: Self-pay | Admitting: Family Medicine

## 2018-11-13 DIAGNOSIS — I10 Essential (primary) hypertension: Secondary | ICD-10-CM

## 2018-12-09 ENCOUNTER — Encounter: Payer: Self-pay | Admitting: Family Medicine

## 2018-12-11 ENCOUNTER — Other Ambulatory Visit: Payer: Self-pay | Admitting: Family Medicine

## 2018-12-15 ENCOUNTER — Other Ambulatory Visit: Payer: Self-pay

## 2018-12-15 ENCOUNTER — Other Ambulatory Visit: Payer: Medicare Other

## 2018-12-15 DIAGNOSIS — E349 Endocrine disorder, unspecified: Secondary | ICD-10-CM

## 2018-12-16 LAB — TESTOSTERONE: Testosterone: 170 ng/dL — ABNORMAL LOW (ref 264–916)

## 2018-12-16 LAB — HEMOGLOBIN AND HEMATOCRIT, BLOOD
Hematocrit: 47.7 % (ref 37.5–51.0)
Hemoglobin: 16.1 g/dL (ref 13.0–17.7)

## 2018-12-19 ENCOUNTER — Telehealth: Payer: Self-pay | Admitting: Urology

## 2018-12-19 NOTE — Telephone Encounter (Signed)
Would you call [name] and have him schedule a follow up in July?  He will need testosterone (one week after an injection), PSA, HCT and hemoglobin drawn prior to that appointment.

## 2019-01-05 ENCOUNTER — Ambulatory Visit: Payer: Medicare Other

## 2019-01-06 ENCOUNTER — Ambulatory Visit (INDEPENDENT_AMBULATORY_CARE_PROVIDER_SITE_OTHER): Payer: Medicare Other

## 2019-01-06 ENCOUNTER — Other Ambulatory Visit: Payer: Self-pay

## 2019-01-06 DIAGNOSIS — Z0000 Encounter for general adult medical examination without abnormal findings: Secondary | ICD-10-CM | POA: Diagnosis not present

## 2019-01-06 NOTE — Progress Notes (Signed)
Subjective:   Curtis [name] is a 68 y.o. male who presents for Medicare Annual/Subsequent preventive examination.    This visit is being conducted through telemedicine due to the COVID-19 pandemic. This patient has given me verbal consent via doximity to conduct this visit, patient states they are participating from their home address. Some vital signs may be absent or patient reported.    Patient identification: identified by name, DOB, and current address  Review of Systems:  N/A  Cardiac Risk Factors include: advanced age (>98men, >101 women);dyslipidemia;hypertension;obesity (BMI >30kg/m2);male gender     Objective:    Vitals: There were no vitals taken for this visit.  There is no height or weight on file to calculate BMI. Unable to obtain vitals due to visit being conducted via telephonically.   Advanced Directives 01/06/2019 01/02/2018  Does Patient Have a Medical Advance Directive? Yes Yes  Type of Advance Directive Living will;Healthcare Power of [place];Living will  Copy of [place] in Chart? No - copy requested No - copy requested    Tobacco Social History   Tobacco Use  Smoking Status Never Smoker  Smokeless Tobacco Former Systems developer  . Types: Snuff  Tobacco Comment   quit snuff at age 24     Counseling given: Not Answered Comment: quit snuff at age 37   Clinical Intake:  Pre-visit preparation completed: Yes  Pain : No/denies pain Pain Score: 0-No pain     Nutritional Status: BMI > 30  Obese Nutritional Risks: None Diabetes: No  How often do you need to have someone help you when you read instructions, pamphlets, or other written materials from your doctor or pharmacy?: 1 - Never  Interpreter Needed?: No  Information entered by :: [facility], LPN  Past Medical History:  Diagnosis Date  . HTN (hypertension)   . Hyperlipidemia   . Hypogonadism in male   . Low back pain    Past Surgical History:   Procedure Laterality Date  . ACHILLES TENDON SURGERY Right 1995  . HERNIA REPAIR  2002   double  . Surgery on Left pinky in 2000     Family History  Problem Relation Age of Onset  . Hypertension Mother   . Arthritis Mother   . Vision loss Mother        due to age  . Cancer Father        in the liver   Social History   Socioeconomic History  . Marital status: Married    Spouse name: Not on file  . Number of children: 2  . Years of education: Not on file  . Highest education level: Bachelor's degree (e.g., BA, AB, BS)  Occupational History  . 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 Financial resource strain: Not hard at all  . Food insecurity:    Worry: Never true    Inability: Never true  . Transportation needs:    Medical: No    Non-medical: No  Tobacco Use  . Smoking status: Never Smoker  . Smokeless tobacco: Former Systems developer    Types: Snuff  . Tobacco comment: quit snuff at age 40  Substance and Sexual Activity  . Alcohol use: Yes    Alcohol/week: 2.0 - 4.0 standard drinks    Types: 2 - 4 Cans of beer per week  . Drug use: No  . Sexual activity: Not on file  Lifestyle  . Physical activity:  Days per week: 0 days    Minutes per session: 0 min  . Stress: Only a little  Relationships  . Social connections:    Talks on phone: Patient refused    Gets together: Patient refused    Attends religious service: Patient refused    Active member of club or organization: Patient refused    Attends meetings of clubs or organizations: Patient refused    Relationship status: Patient refused  Other Topics Concern  . Not on file  Social History Narrative  . Not on file    Outpatient Encounter Medications as of 01/06/2019  Medication Sig  . Hypodermic Needles-Disposable (SAFETY-GARD NEEDLE 18G) MISC 0.5 mLs by Does not apply route every 14 (fourteen) days.  [name] Kitchen lisinopril-hydrochlorothiazide (ZESTORETIC) 20-25 MG tablet Take 1 tablet by mouth  daily  . NEEDLE, DISP, 21 G (BD SAFETYGLIDE SHIELDED NEEDLE) 21G X 1-1/2" MISC 0.5 mLs by Does not apply route every 14 (fourteen) days.  . niacin 500 MG tablet Take 500 mg by mouth daily.  . pravastatin (PRAVACHOL) 40 MG tablet TAKE 1 TABLET BY MOUTH ONCE DAILY (Patient taking differently: Take 20 mg by mouth daily. )  . Syringe, Disposable, 3 ML MISC 0.5 mLs by Does not apply route every 14 (fourteen) days.  [name] Kitchen testosterone cypionate (DEPO-TESTOSTERONE) 200 MG/ML injection Inject 1 mL (200 mg total) into the muscle every 14 (fourteen) days. Inject 0.19mL q14days   No facility-administered encounter medications on file as of 01/06/2019.     Activities of Daily Living In your present state of health, do you have any difficulty performing the following activities: 01/06/2019  Hearing? N  Vision? N  Difficulty concentrating or making decisions? N  Walking or climbing stairs? Y  Comment Due to left knee pain.   Dressing or bathing? N  Doing errands, shopping? N  Preparing Food and eating ? N  Using the Toilet? N  In the past six months, have you accidently leaked urine? N  Do you have problems with loss of bowel control? N  Managing your Medications? N  Managing your Finances? N  Housekeeping or managing your Housekeeping? N  Some recent data might be hidden    Patient Care Team: Chrismon, [name], PA as PCP - General (Physician Assistant) Laneta Simmers as Physician Assistant (Urology)   Assessment:   This is a routine wellness examination for Curtis [name].  Exercise Activities and Dietary recommendations Current Exercise Habits: Home exercise routine, Type of exercise: walking, Time (Minutes): 55, Frequency (Times/Week): 4, Weekly Exercise (Minutes/Week): 220, Intensity: Mild, Exercise limited by: None identified  Goals    . DIET - REDUCE SUGAR INTAKE     Recommend cutting back in sweets and desserts in diet and monitor sugar intake.        Fall Risk: Fall Risk   01/06/2019 01/02/2018 01/01/2017  Falls in the past year? 0 No No    FALL RISK PREVENTION PERTAINING TO THE HOME:  Any stairs in or around the home? No  If so, are there any without handrails? N/A  Home free of loose throw rugs in walkways, pet beds, electrical cords, etc? Yes  Adequate lighting in your home to reduce risk of falls? Yes   ASSISTIVE DEVICES UTILIZED TO PREVENT FALLS:  Life alert? No  Use of a cane, walker or w/c? No  Grab bars in the bathroom? Yes  Shower chair or bench in shower? No  Elevated toilet seat or a handicapped toilet? No   TIMED  UP AND GO:  Was the test performed? No .    Depression Screen PHQ 2/9 Scores 01/06/2019 01/06/2019 01/02/2018 01/01/2017  PHQ - 2 Score 0 0 0 0  PHQ- 9 Score 0 - - 0    Cognitive Function     6CIT Screen 01/06/2019 01/02/2018  What Year? 0 points 0 points  What month? 0 points 0 points  What time? 0 points 0 points  Count back from 20 0 points 0 points  Months in reverse 0 points 0 points  Repeat phrase 0 points 4 points  Total Score 0 4    Immunization History  Administered Date(s) Administered  . Influenza-Unspecified 05/07/2017, 04/25/2018  . Pneumococcal Conjugate-13 01/01/2017  . Pneumococcal Polysaccharide-23 01/02/2018  . Td 08/06/2001  . Tdap 10/03/2010  . Zoster 11/11/2015    Qualifies for Shingles Vaccine? Yes  Zostavax completed 11/01/15. Due for Shingrix. Education has been provided regarding the importance of this vaccine. Pt has been advised to call insurance company to determine out of pocket expense. Advised may also receive vaccine at local pharmacy or Health Dept. Verbalized acceptance and understanding.  Tdap: Up to date  Flu Vaccine: Up to date  Pneumococcal Vaccine: Completed series  Screening Tests Health Maintenance  Topic Date Due  . INFLUENZA VACCINE  02/28/2019  . TETANUS/TDAP  10/02/2020  . COLONOSCOPY  05/14/2027  . Hepatitis C Screening  Completed  . PNA vac Low Risk Adult  Completed    Cancer Screenings:  Colorectal Screening: Completed 05/13/17. Repeat every 10 years.  Lung Cancer Screening: (Low Dose CT Chest recommended if Age 67-80 years, 30 pack-year currently smoking OR have quit w/in 15years.) does not qualify.   Additional Screening:  Hepatitis C Screening: Up to date  Vision Screening: Recommended annual ophthalmology exams for early detection of glaucoma and other disorders of the eye.  Dental Screening: Recommended annual dental exams for proper oral hygiene  Community Resource Referral:  CRR required this visit?  No        Plan:  I have personally reviewed and addressed the Medicare Annual Wellness questionnaire and have noted the following in the patient's chart:  A. Medical and social history B. Use of alcohol, tobacco or illicit drugs  C. Current medications and supplements D. Functional ability and status E.  Nutritional status F.  Physical activity G. Advance directives H. List of other physicians I.  Hospitalizations, surgeries, and ER visits in previous 12 months J.  [place] such as hearing and vision if needed, cognitive and depression L. Referrals and appointments   In addition, I have reviewed and discussed with patient certain preventive protocols, quality metrics, and best practice recommendations. A written personalized care plan for preventive services as well as general preventive health recommendations were provided to patient.   Glendora Score, LPN  04/05/2835 Nurse Health Advisor   Nurse Notes: None.

## 2019-01-06 NOTE — Patient Instructions (Addendum)
Curtis [name] , Thank you for taking time to come for your Medicare Wellness Visit. I appreciate your ongoing commitment to your health goals. Please review the following plan we discussed and let me know if I can assist you in the future.   Screening recommendations/referrals: Colonoscopy: Up to date, due 04/2027 Recommended yearly ophthalmology/optometry visit for glaucoma screening and checkup Recommended yearly dental visit for hygiene and checkup  Vaccinations: Influenza vaccine: Up to date Pneumococcal vaccine: Completed series Tdap vaccine: Up to date, due 09/2020 Shingles vaccine: Pt declines today.     Advanced directives: Please bring a copy of your POA (Power of Attorney) and/or Living Will to your next appointment.   Conditions/risks identified: Continue to cut back on desserts to decrease sugar intake in daily diet.   Next appointment: 01/12/19 @ 10:40 AM with Curtis [name].   Preventive Care 68 Years and Older, Male Preventive care refers to lifestyle choices and visits with your health care provider that can promote health and wellness. What does preventive care include?  A yearly physical exam. This is also called an annual well check.  Dental exams once or twice a year.  Routine eye exams. Ask your health care provider how often you should have your eyes checked.  Personal lifestyle choices, including:  Daily care of your teeth and gums.  Regular physical activity.  Eating a healthy diet.  Avoiding tobacco and drug use.  Limiting alcohol use.  Practicing safe sex.  Taking low doses of aspirin every day.  Taking vitamin and mineral supplements as recommended by your health care provider. What happens during an annual well check? The services and screenings done by your health care provider during your annual well check will depend on your age, overall health, lifestyle risk factors, and family history of disease. Counseling  Your health care provider  may ask you questions about your:  Alcohol use.  Tobacco use.  Drug use.  Emotional well-being.  Home and relationship well-being.  Sexual activity.  Eating habits.  History of falls.  Memory and ability to understand (cognition).  Work and work Statistician. Screening  You may have the following tests or measurements:  Height, weight, and BMI.  Blood pressure.  Lipid and cholesterol levels. These may be checked every 5 years, or more frequently if you are over 52 years old.  Skin check.  Lung cancer screening. You may have this screening every year starting at age 14 if you have a 30-pack-year history of smoking and currently smoke or have quit within the past 15 years.  Fecal occult blood test (FOBT) of the stool. You may have this test every year starting at age 28.  Flexible sigmoidoscopy or colonoscopy. You may have a sigmoidoscopy every 5 years or a colonoscopy every 10 years starting at age 50.  Prostate cancer screening. Recommendations will vary depending on your family history and other risks.  Hepatitis C blood test.  Hepatitis B blood test.  Sexually transmitted disease (STD) testing.  Diabetes screening. This is done by checking your blood sugar (glucose) after you have not eaten for a while (fasting). You may have this done every 1-3 years.  Abdominal aortic aneurysm (AAA) screening. You may need this if you are a current or former smoker.  Osteoporosis. You may be screened starting at age 20 if you are at high risk. Talk with your health care provider about your test results, treatment options, and if necessary, the need for more tests. Vaccines  Your health care  provider may recommend certain vaccines, such as:  Influenza vaccine. This is recommended every year.  Tetanus, diphtheria, and acellular pertussis (Tdap, Td) vaccine. You may need a Td booster every 10 years.  Zoster vaccine. You may need this after age 47.  Pneumococcal 13-valent  conjugate (PCV13) vaccine. One dose is recommended after age 31.  Pneumococcal polysaccharide (PPSV23) vaccine. One dose is recommended after age 50. Talk to your health care provider about which screenings and vaccines you need and how often you need them. This information is not intended to replace advice given to you by your health care provider. Make sure you discuss any questions you have with your health care provider. Document Released: 08/12/2015 Document Revised: 04/04/2016 Document Reviewed: 05/17/2015 Elsevier Interactive Patient Education  2017 [place] Prevention in the Home Falls can cause injuries. They can happen to people of all ages. There are many things you can do to make your home safe and to help prevent falls. What can I do on the outside of my home?  Regularly fix the edges of walkways and driveways and fix any cracks.  Remove anything that might make you trip as you walk through a door, such as a raised step or threshold.  Trim any bushes or trees on the path to your home.  Use bright outdoor lighting.  Clear any walking paths of anything that might make someone trip, such as rocks or tools.  Regularly check to see if handrails are loose or broken. Make sure that both sides of any steps have handrails.  Any raised decks and porches should have guardrails on the edges.  Have any leaves, snow, or ice cleared regularly.  Use sand or salt on walking paths during winter.  Clean up any spills in your garage right away. This includes oil or grease spills. What can I do in the bathroom?  Use night lights.  Install grab bars by the toilet and in the tub and shower. Do not use towel bars as grab bars.  Use non-skid mats or decals in the tub or shower.  If you need to sit down in the shower, use a plastic, non-slip stool.  Keep the floor dry. Clean up any water that spills on the floor as soon as it happens.  Remove soap buildup in the tub or  shower regularly.  Attach bath mats securely with double-sided non-slip rug tape.  Do not have throw rugs and other things on the floor that can make you trip. What can I do in the bedroom?  Use night lights.  Make sure that you have a light by your bed that is easy to reach.  Do not use any sheets or blankets that are too big for your bed. They should not hang down onto the floor.  Have a firm chair that has side arms. You can use this for support while you get dressed.  Do not have throw rugs and other things on the floor that can make you trip. What can I do in the kitchen?  Clean up any spills right away.  Avoid walking on wet floors.  Keep items that you use a lot in easy-to-reach places.  If you need to reach something above you, use a strong step stool that has a grab bar.  Keep electrical cords out of the way.  Do not use floor polish or wax that makes floors slippery. If you must use wax, use non-skid floor wax.  Do not have throw  rugs and other things on the floor that can make you trip. What can I do with my stairs?  Do not leave any items on the stairs.  Make sure that there are handrails on both sides of the stairs and use them. Fix handrails that are broken or loose. Make sure that handrails are as long as the stairways.  Check any carpeting to make sure that it is firmly attached to the stairs. Fix any carpet that is loose or worn.  Avoid having throw rugs at the top or bottom of the stairs. If you do have throw rugs, attach them to the floor with carpet tape.  Make sure that you have a light switch at the top of the stairs and the bottom of the stairs. If you do not have them, ask someone to add them for you. What else can I do to help prevent falls?  Wear shoes that:  Do not have high heels.  Have rubber bottoms.  Are comfortable and fit you well.  Are closed at the toe. Do not wear sandals.  If you use a stepladder:  Make sure that it is fully  opened. Do not climb a closed stepladder.  Make sure that both sides of the stepladder are locked into place.  Ask someone to hold it for you, if possible.  Clearly mark and make sure that you can see:  Any grab bars or handrails.  First and last steps.  Where the edge of each step is.  Use tools that help you move around (mobility aids) if they are needed. These include:  Canes.  Walkers.  Scooters.  Crutches.  Turn on the lights when you go into a dark area. Replace any light bulbs as soon as they burn out.  Set up your furniture so you have a clear path. Avoid moving your furniture around.  If any of your floors are uneven, fix them.  If there are any pets around you, be aware of where they are.  Review your medicines with your doctor. Some medicines can make you feel dizzy. This can increase your chance of falling. Ask your doctor what other things that you can do to help prevent falls. This information is not intended to replace advice given to you by your health care provider. Make sure you discuss any questions you have with your health care provider. Document Released: 05/12/2009 Document Revised: 12/22/2015 Document Reviewed: 08/20/2014 Elsevier Interactive Patient Education  2017 [employer].

## 2019-01-09 NOTE — Progress Notes (Signed)
Patient: Curtis [name], Male    DOB: Mar 22, 1951, 68 y.o.   MRN: [id number] Visit Date: 01/12/2019  Today's Provider: [name], PA   Chief Complaint  Patient presents with  . Annual Exam   Subjective:  Curtis [name] is a 68 y.o. male who presents today for health maintenance and complete physical. He feels well. He reports exercising 4 times weekly. He reports he is sleeping well.  Patient was seen by Nurse Health Advisor on 01/06/19 for Wellness Exam.  Immunization History  Administered Date(s) Administered  . Influenza-Unspecified 05/07/2017, 04/25/2018  . Pneumococcal Conjugate-13 01/01/2017  . Pneumococcal Polysaccharide-23 01/02/2018  . Td 08/06/2001  . Tdap 10/03/2010  . Zoster 11/11/2015   01/02/2012 Colonoscopy, Dr. Ginger Carne, repeat in 10 years.  Review of Systems  Constitutional: Negative.   HENT: Negative.   Eyes: Positive for itching.  Respiratory: Negative.   Cardiovascular: Negative.   Gastrointestinal: Negative.   Endocrine: Negative.   Genitourinary: Negative.   Musculoskeletal: Positive for arthralgias (left knee pain, left pinky finger abnormality, left first finger with nodule) and back pain.  Skin: Negative.   Allergic/Immunologic: Negative.   Neurological: Negative.   Hematological: Negative.   Psychiatric/Behavioral: Negative.     Social History   Socioeconomic History  . Marital status: Married    Spouse name: Not on file  . Number of children: 2  . Years of education: Not on file  . Highest education level: Bachelor's degree (e.g., BA, AB, BS)  Occupational History  . 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 Financial resource strain: Not hard at all  . Food insecurity    Worry: Never true    Inability: Never true  . Transportation needs    Medical: No    Non-medical: No  Tobacco Use  . Smoking status: Never Smoker  . Smokeless tobacco: Former Systems developer    Types: Snuff  . Tobacco comment:  quit snuff at age 14  Substance and Sexual Activity  . Alcohol use: Yes    Alcohol/week: 2.0 - 4.0 standard drinks    Types: 2 - 4 Cans of beer per week  . Drug use: No  . Sexual activity: Not on file  Lifestyle  . Physical activity    Days per week: 0 days    Minutes per session: 0 min  . Stress: Only a little  Relationships  . Social Herbalist on phone: Patient refused    Gets together: Patient refused    Attends religious service: Patient refused    Active member of club or organization: Patient refused    Attends meetings of clubs or organizations: Patient refused    Relationship status: Patient refused  . Intimate partner violence    Fear of current or ex partner: Patient refused    Emotionally abused: Patient refused    Physically abused: Patient refused    Forced sexual activity: Patient refused  Other Topics Concern  . Not on file  Social History Narrative  . Not on file   Patient Active Problem List   Diagnosis Date Noted  . Hypertension 11/11/2015  . Hyperlipidemia 11/11/2015  . Testosterone deficiency 02/03/2015  . 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 ACHILLES TENDON SURGERY Right 1995  . HERNIA REPAIR  2002   double  . Surgery on Left pinky in 2000  His family history includes Arthritis in his mother; Cancer in his father; Hypertension in his mother; Vision loss in his mother.     Outpatient Encounter Medications as of 01/12/2019  Medication Sig  . Hypodermic Needles-Disposable (SAFETY-GARD NEEDLE 18G) MISC 0.5 mLs by Does not apply route every 14 (fourteen) days.  [name] Kitchen lisinopril-hydrochlorothiazide (ZESTORETIC) 20-25 MG tablet Take 1 tablet by mouth daily  . NEEDLE, DISP, 21 G (BD SAFETYGLIDE SHIELDED NEEDLE) 21G X 1-1/2" MISC 0.5 mLs by Does not apply route every 14 (fourteen) days.  . niacin 500 MG tablet Take  500 mg by mouth daily.  . pravastatin (PRAVACHOL) 40 MG tablet TAKE 1 TABLET BY MOUTH ONCE DAILY (Patient taking differently: Take 20 mg by mouth daily. )  . Syringe, Disposable, 3 ML MISC 0.5 mLs by Does not apply route every 14 (fourteen) days.  [name] Kitchen testosterone cypionate (DEPO-TESTOSTERONE) 200 MG/ML injection Inject 1 mL (200 mg total) into the muscle every 14 (fourteen) days. Inject 0.36mL q14days   No facility-administered encounter medications on file as of 01/12/2019.    Patient Care Team: , Curtis Muff, PA as PCP - General (Physician Assistant) Curtis [name] as Physician Assistant (Urology)     Objective:   Vitals:  Vitals:   01/12/19 1036  BP: (!) 170/84  Pulse: 65  Temp: 98.2 F (36.8 C)  TempSrc: Oral  SpO2: 97%  Weight: 204 lb (92.5 kg)  Height: 5\' 9"  (1.753 m)   Physical Exam Constitutional:      Appearance: He is well-developed.  HENT:     Head: Normocephalic and atraumatic.     Right Ear: External ear normal.     Left Ear: External ear normal.     Nose: Nose normal.  Eyes:     General:        Right eye: No discharge.     Conjunctiva/sclera: Conjunctivae normal.     Pupils: Pupils are equal, round, and reactive to light.  Neck:     Musculoskeletal: Normal range of motion and neck supple.     Thyroid: No thyromegaly.     Trachea: No tracheal deviation.  Cardiovascular:     Rate and Rhythm: Normal rate and regular rhythm.     Heart sounds: Normal heart sounds. No murmur.  Pulmonary:     Effort: Pulmonary effort is normal. No respiratory distress.     Breath sounds: Normal breath sounds. No wheezing or rales.  Chest:     Chest wall: No tenderness.  Abdominal:     General: There is no distension.     Palpations: Abdomen is soft. There is no mass.     Tenderness: There is no abdominal tenderness. There is no guarding or rebound.  Genitourinary:    Comments: Exam deferred to urologist. Musculoskeletal: Normal range of motion.         General: No tenderness.     Comments: Stiff left 5th finger PIP joint with radial deformity from past fracture fixed with an internal screw. Skin irritation and callus radially. Also, some mild effusion below patella along each side of the patellar ligament. Good ROM without crepitus, locking or ligament laxity. Good pulses throughout all extremities.  Lymphadenopathy:     Cervical: No cervical adenopathy.  Skin:    General: Skin is warm and dry.     Findings: No erythema or rash.  Neurological:     Mental Status: He is alert and oriented to person, place, and time.     Cranial Nerves:  No cranial nerve deficit.     Motor: No abnormal muscle tone.     Coordination: Coordination normal.     Deep Tendon Reflexes: Reflexes are normal and symmetric. Reflexes normal.  Psychiatric:        Behavior: Behavior normal.        Thought Content: Thought content normal.        Judgment: Judgment normal.     Depression Screen PHQ 2/9 Scores 01/06/2019 01/06/2019 01/02/2018 01/01/2017  PHQ - 2 Score 0 0 0 0  PHQ- 9 Score 0 - - 0    Assessment & Plan:     Routine Health Maintenance and Physical Exam  Exercise Activities and Dietary recommendations Goals    . DIET - REDUCE SUGAR INTAKE     Recommend cutting back in sweets and desserts in diet and monitor sugar intake.        Immunization History  Administered Date(s) Administered  . Influenza-Unspecified 05/07/2017, 04/25/2018  . Pneumococcal Conjugate-13 01/01/2017  . Pneumococcal Polysaccharide-23 01/02/2018  . Td 08/06/2001  . Tdap 10/03/2010  . Zoster 11/11/2015    Health Maintenance  Topic Date Due  . INFLUENZA VACCINE  02/28/2019  . TETANUS/TDAP  10/02/2020  . COLONOSCOPY  05/14/2027  . Hepatitis C Screening  Completed  . PNA vac Low Risk Adult  Completed     Discussed health benefits of physical activity, and encouraged him to engage in regular exercise appropriate for his age and condition.   1. Essential hypertension Some  systolic elevation today but feeling well and tolerating the Lisinopril-HCTZ 20-25 mg qd without side effects. Feels knee discomfort and pandemic restrictions may be causing BP elevation today. Advised to restrict salt and caffeine intake. Will check routine labs and continue present medications. Immunizations up to date and last colonoscopy reported as 2013 by Dr. Candace Cruise and normal. Continue follow up with urologist for low testosterone and BPH recheck. - CBC with Differential/Platelet - Comprehensive metabolic panel - TSH - Magnesium   2. Mixed hyperlipidemia Presently taking Pravastatin 20 mg qd (1/2 a 40 mg tablet) and trying to follow a low fat diet. Will recheck CMP and Lipid panel with TSH. - Comprehensive metabolic panel - Lipid Panel With LDL/HDL Ratio  3. Testosterone deficiency Good energy level on the Depo-testosterone 200 mg IM q 14 days. Last testosterone level was 170 on 12-15-18 ("did it at the wrong time after the injection"). Followed by Brig[facility] Zara Council, PA-C) and plans recheck appointment on 02-17-19 (recheck testosterone the day prior). - CBC with Differential/Platelet  4. Finger pain, left Had a severe fracture in 2003 that required a screw in the left 5th finger. Has irritation and pain in that area from rubbing against his wedding band. Finger will not flex and concerned the screw is starting to back out. Will get x-ray to assess hardware and may need referral to a hand surgeon. - DG Finger Little Left  5. Chronic pain of left knee Onset over the past 3 months after a long walk. Notice pain and difficulty descending stairs. No pain to test FROM or palpate and only minimal effusion. Good relief from wearing a knee supporter and occasionally needs Tylenol Arthritis (does not take any NSAID's). Will get x-ray evaluation for signs of degenerative changes. Continue present regimen and may apply moist heat or ice packs prn. - DG Knee Complete 4 Views  Left

## 2019-01-12 ENCOUNTER — Encounter: Payer: Self-pay | Admitting: Family Medicine

## 2019-01-12 ENCOUNTER — Other Ambulatory Visit: Payer: Self-pay

## 2019-01-12 ENCOUNTER — Ambulatory Visit
Admission: RE | Admit: 2019-01-12 | Discharge: 2019-01-12 | Disposition: A | Discharge status: Home / Self Care | Payer: Medicare Other | Source: Ambulatory Visit | Attending: Family Medicine | Admitting: Family Medicine

## 2019-01-12 ENCOUNTER — Ambulatory Visit (INDEPENDENT_AMBULATORY_CARE_PROVIDER_SITE_OTHER): Payer: Medicare Other | Admitting: Family Medicine

## 2019-01-12 VITALS — BP 170/84 | HR 65 | Temp 98.2°F | Ht 69.0 in | Wt 204.0 lb

## 2019-01-12 DIAGNOSIS — I10 Essential (primary) hypertension: Secondary | ICD-10-CM

## 2019-01-12 DIAGNOSIS — E782 Mixed hyperlipidemia: Secondary | ICD-10-CM

## 2019-01-12 DIAGNOSIS — M25562 Pain in left knee: Secondary | ICD-10-CM

## 2019-01-12 DIAGNOSIS — G8929 Other chronic pain: Secondary | ICD-10-CM

## 2019-01-12 DIAGNOSIS — M79645 Pain in left finger(s): Secondary | ICD-10-CM | POA: Diagnosis present

## 2019-01-12 DIAGNOSIS — E349 Endocrine disorder, unspecified: Secondary | ICD-10-CM

## 2019-01-12 IMAGING — CR LEFT KNEE - COMPLETE 4+ VIEW
1 series · 4 of 4 positions shown · non-contrast
Comparison: None.

CLINICAL DATA: Anterior left knee pain for 2 months. No known
injury.

EXAM:
LEFT KNEE - COMPLETE 4+ VIEW

[Series 1: dg knee complete 4 views left · 0.14mm/px · 4 of 4 slices shown]
[im 1/4]
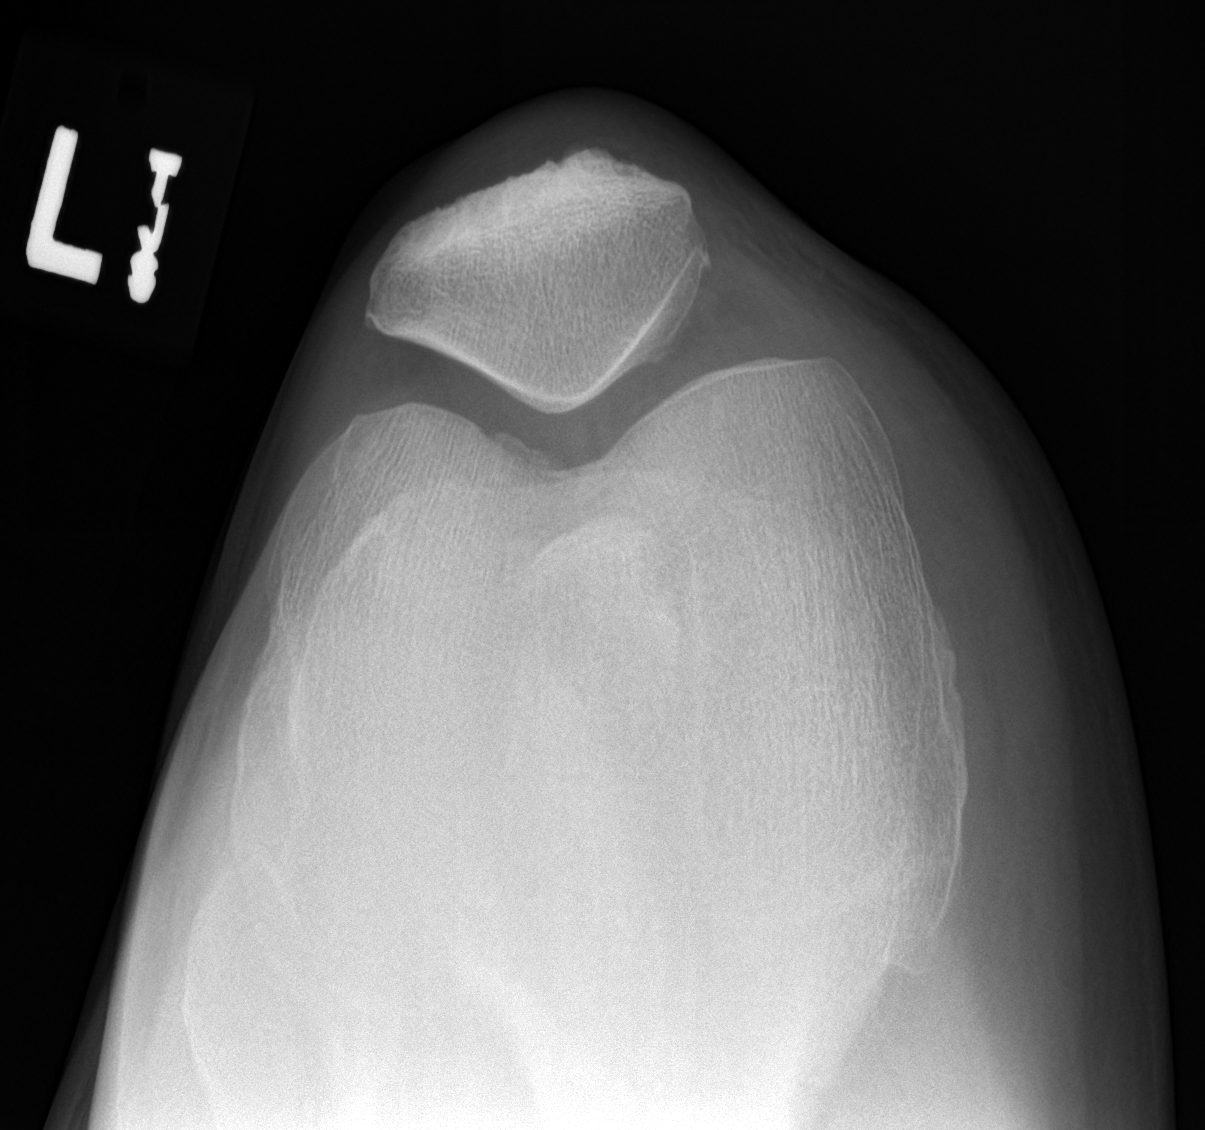
[im 2/4]
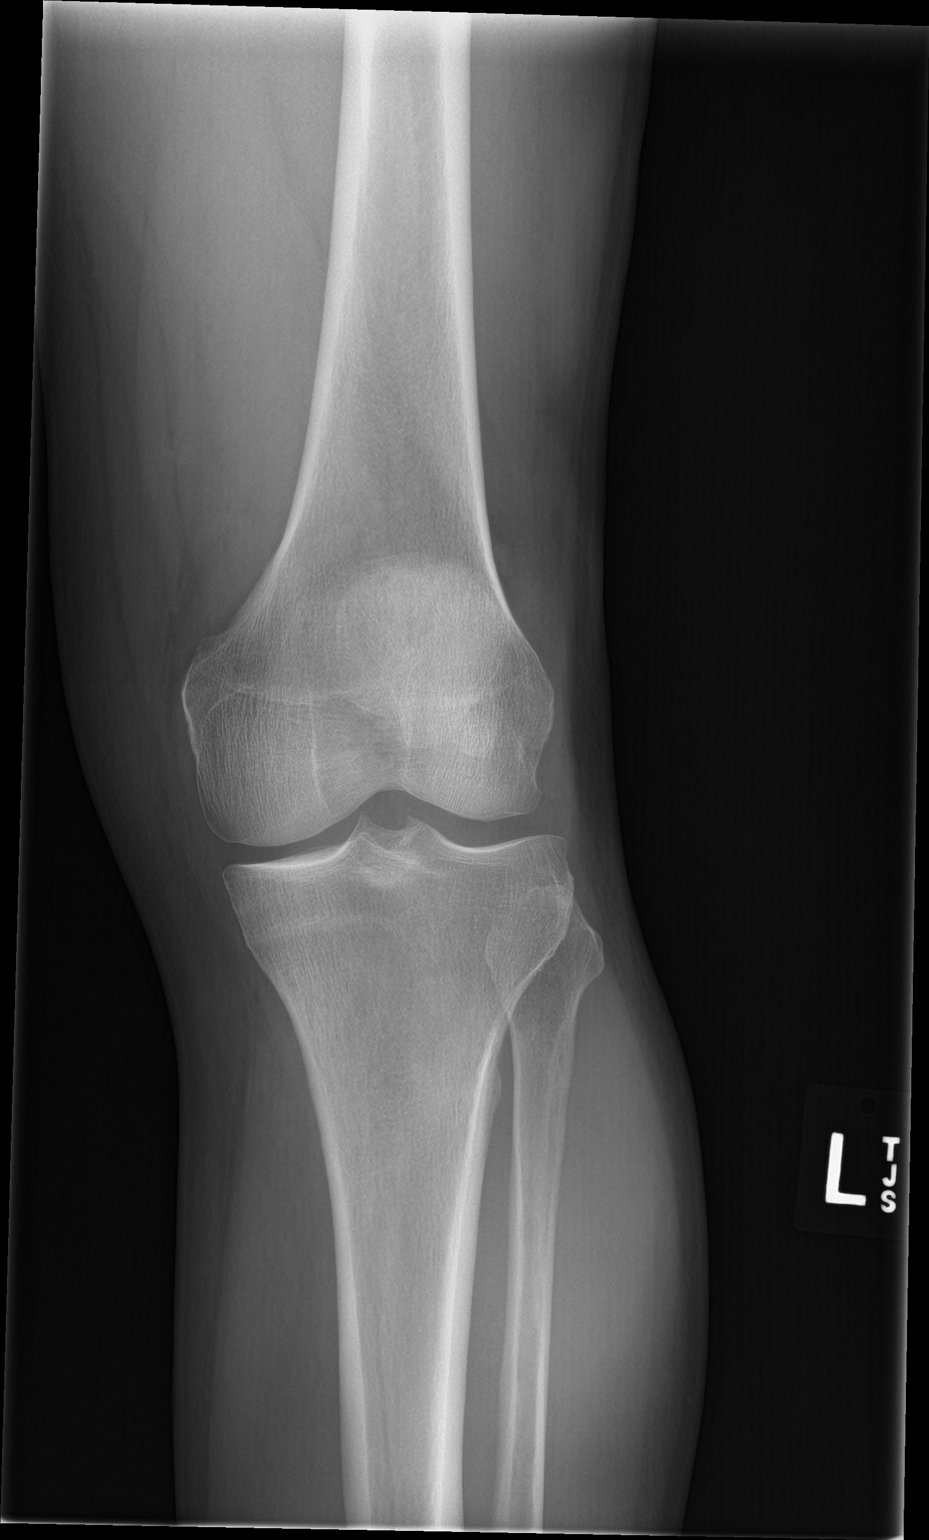
[im 3/4]
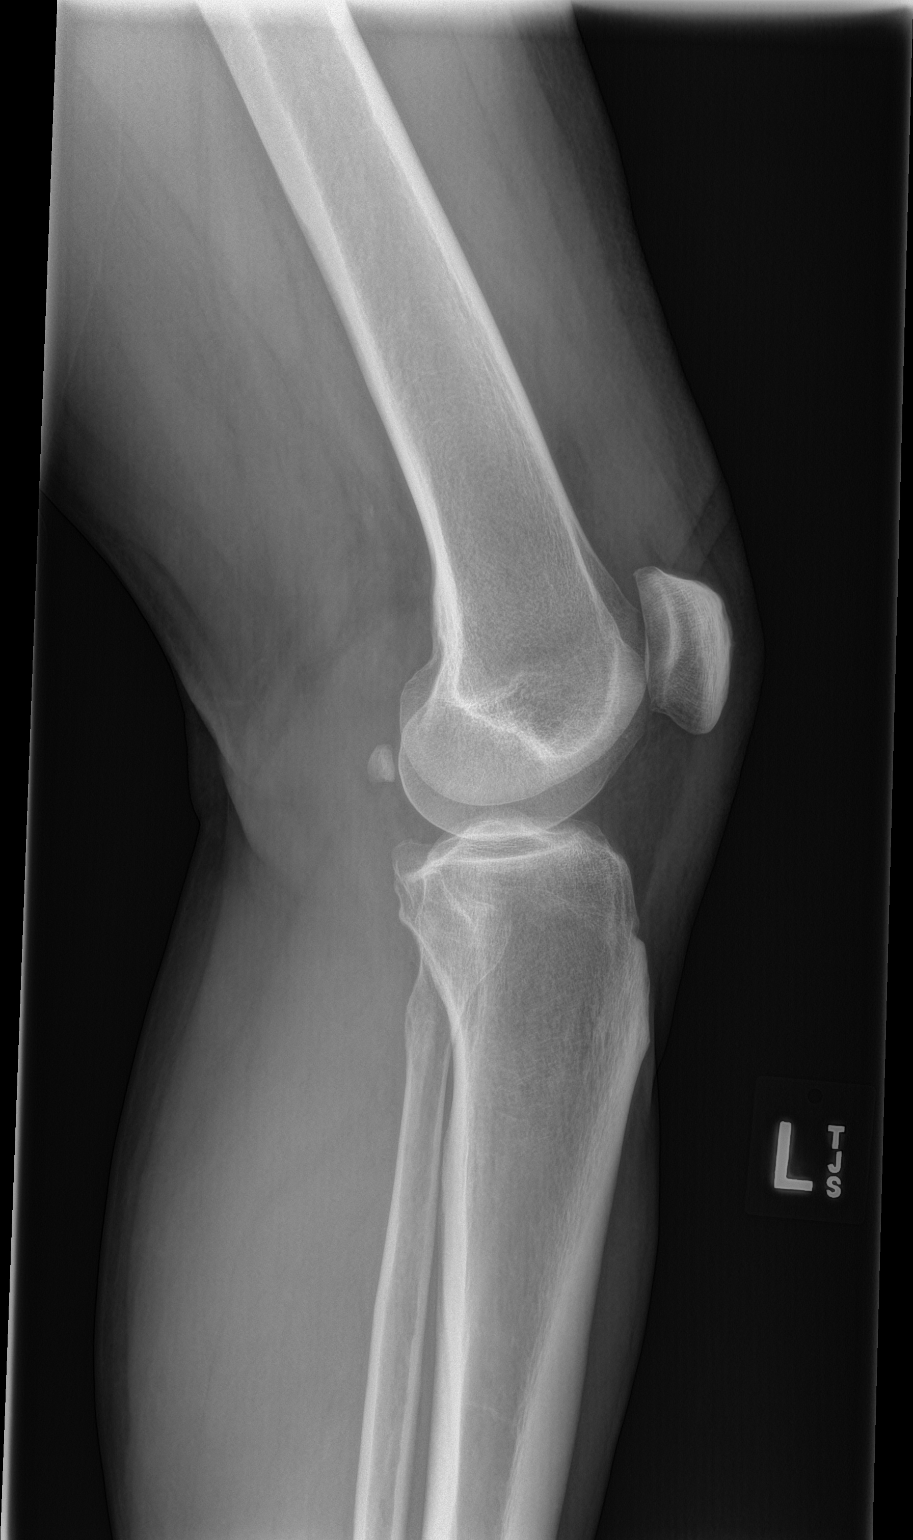
[im 4/4]
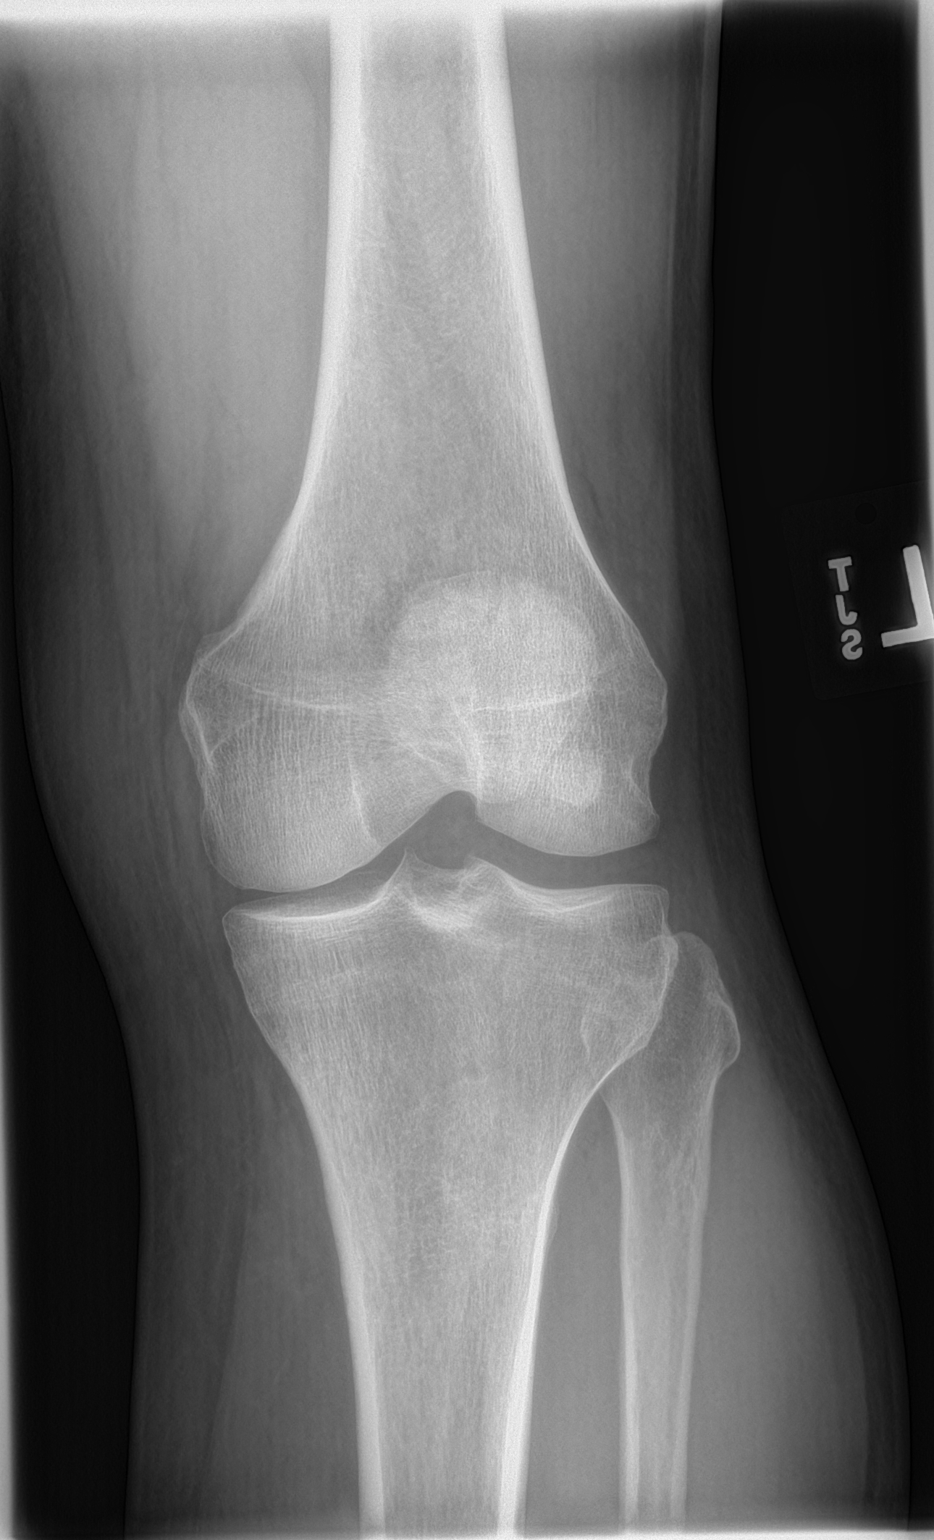

[4 of 4 positions shown; findings below may reference images not displayed]

FINDINGS: There is no fracture or dislocation. Suggestion of a small effusion.
Slight medial joint space narrowing. Tiny marginal osteophytes on
the upper and lower poles of the patella.
IMPRESSION: Slight degenerative changes as described.  Slight joint effusion.

## 2019-01-12 IMAGING — CR LEFT LITTLE FINGER 2+V
1 series · 3 of 3 positions shown · non-contrast
Comparison: Radiographs dated [DATE]

CLINICAL DATA: Left little finger pain. Previous open reduction and
internal fixation of a fracture in [ZF].

EXAM:
LEFT LITTLE FINGER 2+V

[Series 1: dg finger little left · 0.14mm/px · 3 of 3 slices shown]
[im 1/3]
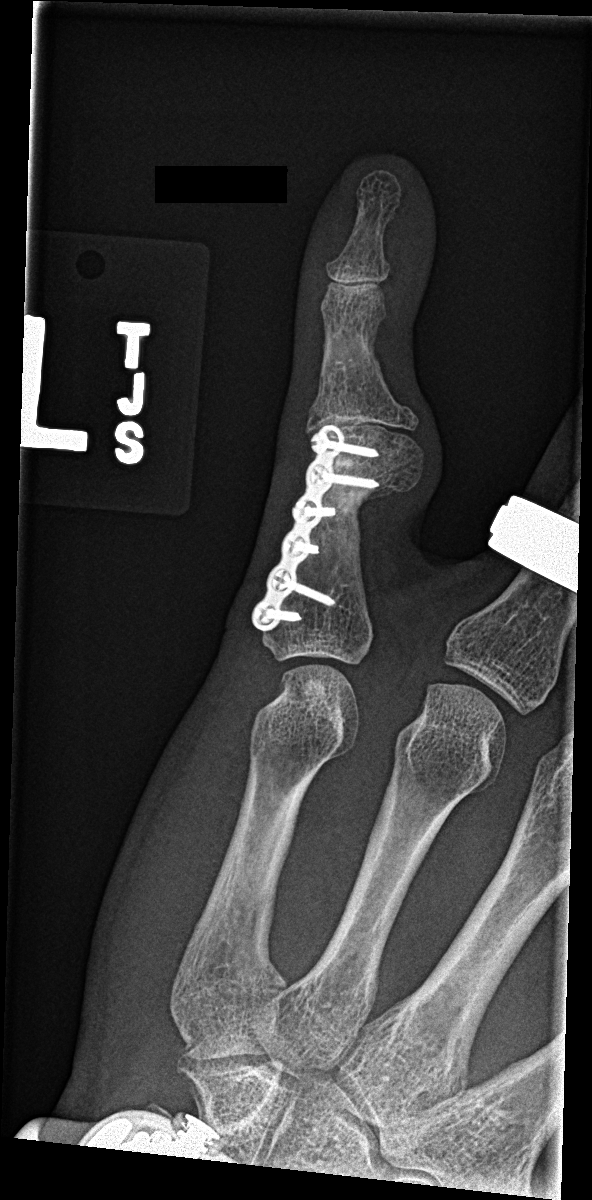
[im 2/3]
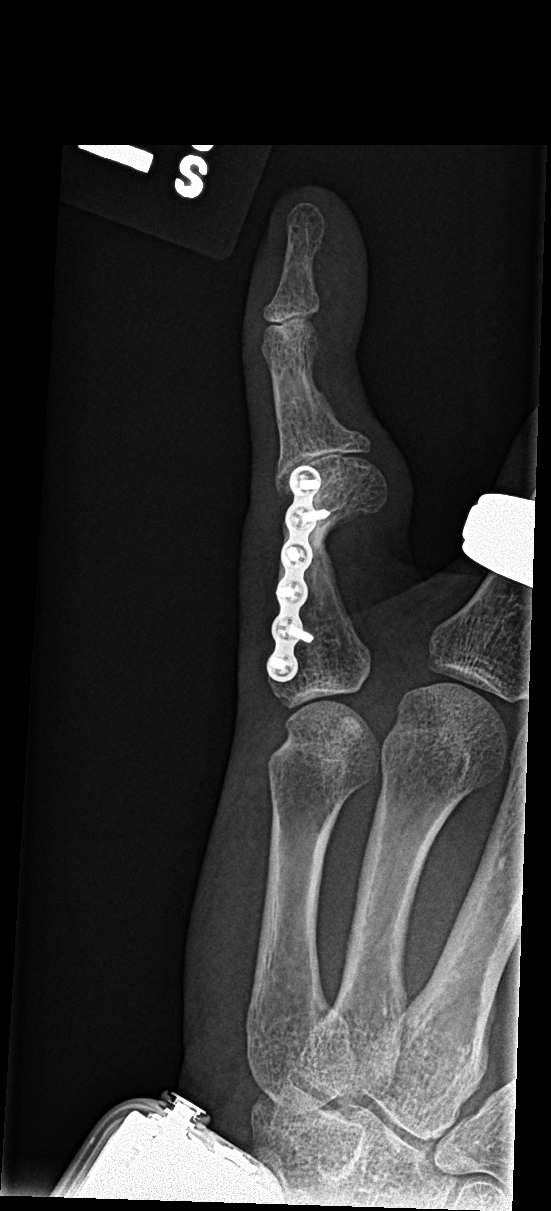
[im 3/3]
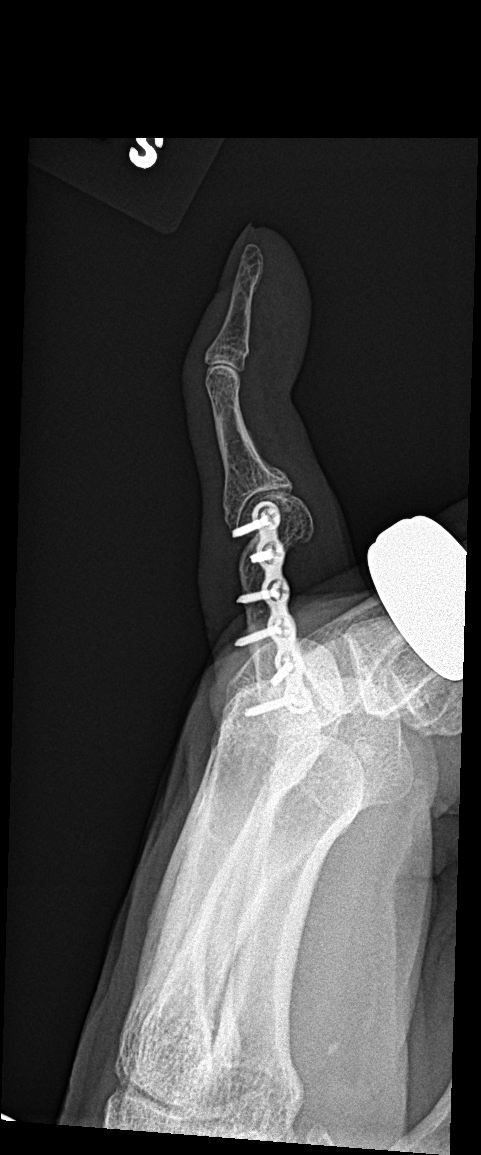

[3 of 3 positions shown; findings below may reference images not displayed]

FINDINGS: There is no acute bone abnormality. There is chronic deformity
secondary to the healed fracture. The plate and 6 screws appear
unchanged. Tips of 4 of the 6 screws extend into the dorsal soft
tissues, unchanged

Chronic posttraumatic arthritic changes of the PIP joint. Slight
chronic narrowing of the DIP joint. No visible loosening.
IMPRESSION: No acute abnormalities. No change since the prior study. See above
discussion.

## 2019-01-12 MED ORDER — PRAVASTATIN SODIUM 40 MG PO TABS: 20.0000 mg | ORAL_TABLET | Freq: Every day | ORAL | Status: DC

## 2019-01-12 MED ORDER — MAGNESIUM 30 MG PO TABS: 30.0000 mg | ORAL_TABLET | Freq: Two times a day (BID) | ORAL | 0 refills | Status: DC

## 2019-01-12 MED ORDER — LORATADINE 10 MG PO TABS: 10.0000 mg | ORAL_TABLET | Freq: Every day | ORAL | 11 refills | Status: DC

## 2019-01-13 ENCOUNTER — Telehealth: Payer: Self-pay

## 2019-01-13 LAB — CBC WITH DIFFERENTIAL/PLATELET
Basophils Absolute: 0 10*3/uL (ref 0.0–0.2)
Basos: 1 %
EOS (ABSOLUTE): 0.1 10*3/uL (ref 0.0–0.4)
Eos: 1 %
Hematocrit: 46.2 % (ref 37.5–51.0)
Hemoglobin: 16 g/dL (ref 13.0–17.7)
Immature Grans (Abs): 0 10*3/uL (ref 0.0–0.1)
Immature Granulocytes: 0 %
Lymphocytes Absolute: 1.7 10*3/uL (ref 0.7–3.1)
Lymphs: 29 %
MCH: 29.5 pg (ref 26.6–33.0)
MCHC: 34.6 g/dL (ref 31.5–35.7)
MCV: 85 fL (ref 79–97)
Monocytes Absolute: 0.8 10*3/uL (ref 0.1–0.9)
Monocytes: 14 %
Neutrophils Absolute: 3.2 10*3/uL (ref 1.4–7.0)
Neutrophils: 55 %
Platelets: 172 10*3/uL (ref 150–450)
RBC: 5.42 x10E6/uL (ref 4.14–5.80)
RDW: 13.1 % (ref 11.6–15.4)
WBC: 5.9 10*3/uL (ref 3.4–10.8)

## 2019-01-13 LAB — LIPID PANEL WITH LDL/HDL RATIO
Cholesterol, Total: 194 mg/dL (ref 100–199)
HDL: 37 mg/dL — ABNORMAL LOW (ref >39)
LDL Calculated: 106 mg/dL — ABNORMAL HIGH (ref 0–99)
LDl/HDL Ratio: 2.9 ratio (ref 0.0–3.6)
Triglycerides: 257 mg/dL — ABNORMAL HIGH (ref 0–149)
VLDL Cholesterol Cal: 51 mg/dL — ABNORMAL HIGH (ref 5–40)

## 2019-01-13 LAB — COMPREHENSIVE METABOLIC PANEL
ALT: 26 IU/L (ref 0–44)
AST: 26 IU/L (ref 0–40)
Albumin/Globulin Ratio: 2.1 (ref 1.2–2.2)
Albumin: 4.7 g/dL (ref 3.8–4.8)
Alkaline Phosphatase: 42 IU/L (ref 39–117)
BUN/Creatinine Ratio: 15 (ref 10–24)
BUN: 13 mg/dL (ref 8–27)
Bilirubin Total: 0.5 mg/dL (ref 0.0–1.2)
CO2: 22 mmol/L (ref 20–29)
Calcium: 9.2 mg/dL (ref 8.6–10.2)
Chloride: 105 mmol/L (ref 96–106)
Creatinine, Ser: 0.89 mg/dL (ref 0.76–1.27)
GFR calc Af Amer: 102 mL/min/{1.73_m2} (ref >59)
GFR calc non Af Amer: 88 mL/min/{1.73_m2} (ref >59)
Globulin, Total: 2.2 g/dL (ref 1.5–4.5)
Glucose: 99 mg/dL (ref 65–99)
Potassium: 4 mmol/L (ref 3.5–5.2)
Sodium: 142 mmol/L (ref 134–144)
Total Protein: 6.9 g/dL (ref 6.0–8.5)

## 2019-01-13 LAB — TSH: TSH: 1.01 u[IU]/mL (ref 0.450–4.500)

## 2019-01-13 NOTE — Telephone Encounter (Signed)
LMTCB

## 2019-01-13 NOTE — Telephone Encounter (Signed)
-----   Message from [name], [place] sent at 01/12/2019  6:00 PM EDT ----- X-ray of knee confirms slight arthritic degenerative changes and slight joint effusion.

## 2019-01-13 NOTE — Telephone Encounter (Signed)
Attempted to contact patient, no answer or voicemail. 

## 2019-01-13 NOTE — Telephone Encounter (Signed)
-----   Message from [name], [place] sent at 01/13/2019  1:06 PM EDT ----- All blood counts, liver and kidney function normal.  Lipids show increase in triglycerides and increase in VLDL. Should take the whole tablet of Pravastatin 40 mg q evening/bedtime with OTC Co-Q 10 100 mg qd. Awaiting final magnesium report.

## 2019-01-13 NOTE — Telephone Encounter (Signed)
-----   Message from [place], [place] sent at 01/12/2019  6:07 PM EDT ----- The six screws and plate in the left little finger seem to be stable and not loose or backing out. If discomfort in knee and finger not helped with the Tylenol, you could try the Voltaren Gel (OTC) which is an NSAID that is applied topically and should not affect stomach or kidneys. The knee brace/supporter is still a good idea for added comfort during strenuous activities. Can schedule referral to orthopedist and/or hand surgeon if needed.

## 2019-01-13 NOTE — Telephone Encounter (Signed)
Patient advised.

## 2019-01-31 ENCOUNTER — Encounter: Payer: Self-pay | Admitting: Family Medicine

## 2019-02-16 ENCOUNTER — Other Ambulatory Visit: Payer: Self-pay

## 2019-02-16 ENCOUNTER — Other Ambulatory Visit: Payer: Medicare Other

## 2019-02-16 DIAGNOSIS — N138 Other obstructive and reflux uropathy: Secondary | ICD-10-CM

## 2019-02-16 DIAGNOSIS — E349 Endocrine disorder, unspecified: Secondary | ICD-10-CM

## 2019-02-16 NOTE — Progress Notes (Signed)
02/17/2019 10:51 AM   Curtis [name] Apr 18, 1951 [id number]  Referring provider: [name], [address] [place],  [place] [address]  Chief Complaint  Patient presents with  . Follow-up   HPI: Patient is a 68 y.o. male with testosterone deficiency, BPH with LUTS and ED who presents today for a 6 months follow up.   Testosterone deficiency He is still having spontaneous erections at night.  His pretreatment labs were: testosterone 228 ng/dl (01/31/2017) and 262 ng/dL (02/04/2017) with normal LH/FSH and prolactin levels.  He is currently managing his testosterone deficiency with testosterone cypionate 200 mg IM, 0.5 cc q2 weeks. He had to discontinue his injections in 09/2017 due to elevated PSA. Most recent testosterone (01/2019) level is 1,257 ng/dL (Goal:450-600 ng/dL). HGB and HCT normal (15.8, 46.2%).  BPH WITH LUTS  (prostate and/or bladder) His IPSS score today is 5/0, it is (prev. 3/0). He is delighted with his quality of life due to his lower urinary tract symptoms. He has no complaints at this time.  He denies any dysuria, hematuria or suprapubic pain. He also denies any recent fevers, chills, nausea or vomiting.  He does not have a family history of PCa.   IPSS    Row Name 02/17/19 1000         International Prostate Symptom Score   How often have you had the sensation of not emptying your bladder?  Less than 1 in 5     How often have you had to urinate less than every two hours?  Not at All     How often have you found you stopped and started again several times when you urinated?  Less than 1 in 5 times     How often have you found it difficult to postpone urination?  Not at All     How often have you had a weak urinary stream?  Less than 1 in 5 times     How often have you had to strain to start urination?  Less than 1 in 5 times     How many times did you typically get up at night to urinate?  1 Time     Total IPSS Score  5       Quality of Life  due to urinary symptoms   If you were to spend the rest of your life with your urinary condition just the way it is now how would you feel about that?  Delighted       PSA History  1.6 ng/mL in 12/2015  1.4 ng/mL in 12/2016  2.4 ng/mL in 10/2017  1.7 ng/mL in 10/2017     1.6 ng/mL in 01/2018  1.6 ng/mL in 05/2018  2.0 ng/mL in 01/2019  Erectile dysfunction His SHIM score is 22, it is stable (prev. 22).  He has been having difficulty with erections for several years. His major complaint is lack of firmness. His libido is diminished.  His risk factors for ED are age, testosterone deficiency, BPH, HTN and HLD.  TSH was 1.49 in 12/2017.   He denies any painful erections or curvatures with his erections.   He is still having spontaneous erections.  He has tried PDE5-inhibitors in the past, but he could not tolerate the side effects.    PMH: Past Medical History:  Diagnosis Date  . HTN (hypertension)   . Hyperlipidemia   . Hypogonadism in male   . Low back pain    Surgical History: Past Surgical History:  Procedure Laterality Date  . ACHILLES TENDON SURGERY Right 1995  . HERNIA REPAIR  2002   double  . Surgery on Left pinky in 2000     Home Medications:  Allergies as of 02/17/2019   No Known Allergies     Medication List       Accurate as of February 17, 2019 10:51 AM. If you have any questions, ask your nurse or doctor.        BD SafetyGlide Shielded Needle 21G X 1-1/2" Misc Generic drug: NEEDLE (DISP) 21 G 0.5 mLs by Does not apply route every 14 (fourteen) days.   lisinopril-hydrochlorothiazide 20-25 MG tablet Commonly known as: ZESTORETIC Take 1 tablet by mouth daily   loratadine 10 MG tablet Commonly known as: CLARITIN Take 1 tablet (10 mg total) by mouth daily. What changed:   when to take this  reasons to take this   magnesium 30 MG tablet Take 1 tablet (30 mg total) by mouth 2 (two) times daily.   pravastatin 40 MG tablet Commonly known as: PRAVACHOL  Take 0.5 tablets (20 mg total) by mouth daily.   SAFETY-GARD Needle 18G Misc Generic drug: Hypodermic Needles-Disposable 0.5 mLs by Does not apply route every 14 (fourteen) days.   Syringe (Disposable) 3 ML Misc 0.5 mLs by Does not apply route every 14 (fourteen) days.   testosterone cypionate 200 MG/ML injection Commonly known as: Depo-Testosterone Inject 1 mL (200 mg total) into the muscle every 14 (fourteen) days. Inject 0.74mL q14days      Allergies: No Known Allergies  Family History: Family History  Problem Relation Age of Onset  . Hypertension Mother   . Arthritis Mother   . Vision loss Mother        due to age  . Cancer Father        in the liver   Social History:  reports that he has never smoked. He has quit using smokeless tobacco.  His smokeless tobacco use included snuff. He reports current alcohol use of about 2.0 - 4.0 standard drinks of alcohol per week. He reports that he does not use drugs.  ROS: UROLOGY Frequent Urination?: No Hard to postpone urination?: No Burning/pain with urination?: No Get up at night to urinate?: No Leakage of urine?: No Urine stream starts and stops?: No Trouble starting stream?: No Do you have to strain to urinate?: No Blood in urine?: No Urinary tract infection?: No Sexually transmitted disease?: No Injury to kidneys or bladder?: No Painful intercourse?: No Weak stream?: No Erection problems?: No Penile pain?: No  Gastrointestinal Nausea?: No Vomiting?: No Indigestion/heartburn?: No Diarrhea?: No Constipation?: No  Constitutional Fever: No Night sweats?: No Weight loss?: No Fatigue?: No  Skin Skin rash/lesions?: No Itching?: No  Eyes Blurred vision?: No Double vision?: No  Ears/Nose/Throat Sore throat?: No Sinus problems?: No  Hematologic/Lymphatic Swollen glands?: No Easy bruising?: No  Cardiovascular Leg swelling?: No Chest pain?: No  Respiratory Cough?: No Shortness of breath?: No   Endocrine Excessive thirst?: No  Musculoskeletal Back pain?: No Joint pain?: No  Neurological Headaches?: No Dizziness?: No  Psychologic Depression?: No Anxiety?: No  Physical Exam: BP (!) 181/84   Pulse 67   Ht 5\' 9"  (1.753 m)   Wt 207 lb (93.9 kg)   BMI 30.57 kg/m   Constitutional:  Well nourished. Alert and oriented, No acute distress. HEENT: [place] AT, moist mucus membranes.  Trachea midline, no masses. Cardiovascular: No clubbing, cyanosis, or edema. Respiratory: Normal respiratory effort, no increased work  of breathing. GI: Abdomen is soft, non tender, non distended, no abdominal masses. Liver and spleen not palpable.  No hernias appreciated.  Stool sample for occult testing is not indicated.   GU: No CVA tenderness.  No bladder fullness or masses.  Patient with circumcised phallus.  Urethral meatus is patent.  No penile discharge. No penile lesions or rashes. Scrotum without lesions, cysts, rashes and/or edema.  Testicles are located scrotally bilaterally. No masses are appreciated in the testicles. Left and right epididymis are normal. Rectal: Patient with  normal sphincter tone. Anus and perineum without scarring or rashes. No rectal masses are appreciated. Prostate is approximately 45 grams, could only palpate the apex and midportion of gland, no nodules are appreciated.  Skin: No rashes, bruises or suspicious lesions. Lymph: No cervical or inguinal adenopathy. Neurologic: Grossly intact, no focal deficits, moving all 4 extremities. Psychiatric: Normal mood and affect.  Laboratory Data: Lab Results  Component Value Date   WBC 5.9 01/12/2019   HGB 15.8 02/16/2019   HCT 46.2 02/16/2019   MCV 85 01/12/2019   PLT 172 01/12/2019   Lab Results  Component Value Date   CREATININE 0.89 01/12/2019   Lab Results  Component Value Date   TESTOSTERONE WILL FOLLOW 02/16/2019   Lab Results  Component Value Date   TSH 1.010 01/12/2019      Component Value Date/Time    CHOL 194 01/12/2019 1206   HDL 37 (L) 01/12/2019 1206   CHOLHDL 5.0 01/07/2018 0939   LDLCALC 106 (H) 01/12/2019 1206   Lab Results  Component Value Date   AST 26 01/12/2019   Lab Results  Component Value Date   ALT 26 01/12/2019   I have independently reviewed the labs.  Assessment & Plan:    1. Testosterone deficiency  Most recent testosterone (01/2019) level is 1,257 ng/dL - goal 450-600 ng/dL Decrease testosterone cypionate 200 mg IM,  0.25 cc every 2 weeks Will recheck testosterone level one week after his fourth injection  2. BPH with LUTS IPSS score is 5/0, it is slightly worse  Continue conservative management, avoiding bladder irritants and timed voiding's  3. Erectile dysfunction:    SHIM score is 22, it is stable  RTC in 6 months for SHIM score and exam, as testosterone therapy can affect erections  Return in about 6 months (around 08/20/2019) for PSA, testosterone (one week after injection) H & H, IPSS, SHIM and exam.  Laneta Simmers  [place] [address] [place], [place] [address] [phone]

## 2019-02-16 NOTE — Addendum Note (Signed)
Addended by: Verlene Mayer A on: 02/16/2019 08:47 AM   Modules accepted: Orders

## 2019-02-17 ENCOUNTER — Ambulatory Visit (INDEPENDENT_AMBULATORY_CARE_PROVIDER_SITE_OTHER): Payer: Medicare Other | Admitting: Urology

## 2019-02-17 ENCOUNTER — Encounter: Payer: Self-pay | Admitting: Urology

## 2019-02-17 VITALS — BP 181/84 | HR 67 | Ht 69.0 in | Wt 207.0 lb

## 2019-02-17 DIAGNOSIS — N529 Male erectile dysfunction, unspecified: Secondary | ICD-10-CM

## 2019-02-17 DIAGNOSIS — N138 Other obstructive and reflux uropathy: Secondary | ICD-10-CM

## 2019-02-17 DIAGNOSIS — N401 Enlarged prostate with lower urinary tract symptoms: Secondary | ICD-10-CM

## 2019-02-17 DIAGNOSIS — E291 Testicular hypofunction: Secondary | ICD-10-CM | POA: Diagnosis not present

## 2019-02-17 DIAGNOSIS — E349 Endocrine disorder, unspecified: Secondary | ICD-10-CM | POA: Diagnosis not present

## 2019-02-17 LAB — PSA: Prostate Specific Ag, Serum: 2 ng/mL (ref 0.0–4.0)

## 2019-02-17 LAB — HEMOGLOBIN AND HEMATOCRIT, BLOOD
Hematocrit: 46.2 % (ref 37.5–51.0)
Hemoglobin: 15.8 g/dL (ref 13.0–17.7)

## 2019-02-17 LAB — TESTOSTERONE: Testosterone: 1257 ng/dL — ABNORMAL HIGH (ref 264–916)

## 2019-02-17 MED ORDER — SAFETY-GARD NEEDLE 18G MISC: 0.5000 mL | 5 refills | Status: DC

## 2019-02-17 MED ORDER — SYRINGE (DISPOSABLE) 3 ML MISC: 0.5000 mL | 5 refills | Status: DC

## 2019-02-17 MED ORDER — "BD SAFETYGLIDE SHIELDED NEEDLE 21G X 1-1/2"" MISC": 0.5000 mL | 5 refills | Status: DC

## 2019-02-17 MED ORDER — TESTOSTERONE CYPIONATE 200 MG/ML IM SOLN: 200.0000 mg | INTRAMUSCULAR | 0 refills | Status: DC

## 2019-02-23 ENCOUNTER — Other Ambulatory Visit: Payer: Self-pay | Admitting: Urology

## 2019-02-23 DIAGNOSIS — E291 Testicular hypofunction: Secondary | ICD-10-CM

## 2019-02-23 NOTE — Telephone Encounter (Signed)
Pt called and states that he would like all of Rx med refills to be sent to [facility] in [place]. Please advise.

## 2019-02-23 NOTE — Telephone Encounter (Signed)
Pharmacy updated in chart, refill request sent to provider.

## 2019-02-26 NOTE — Telephone Encounter (Signed)
RX printed and faxed. 

## 2019-02-26 NOTE — Telephone Encounter (Signed)
All pt's supplies from 02/17/2019 were sent to [facility] on [address] and need to be changed to [place]. [employer].

## 2019-03-04 ENCOUNTER — Telehealth: Payer: Self-pay

## 2019-03-04 NOTE — Telephone Encounter (Signed)
Spoke with [employer]  Curtis [name] was given clinical information and testosterone was Approved for one year Key Number: [id number] Faxed confirmation will be sent  mychart notification sent to patient

## 2019-03-04 NOTE — Telephone Encounter (Signed)
[employer] called and said the patient's testosterone meds have been approved for 1 year [id number]      Sharyn Lull

## 2019-03-15 ENCOUNTER — Encounter: Payer: Self-pay | Admitting: Family Medicine

## 2019-03-16 ENCOUNTER — Other Ambulatory Visit: Payer: Self-pay | Admitting: Family Medicine

## 2019-04-10 ENCOUNTER — Other Ambulatory Visit: Payer: Self-pay | Admitting: Family Medicine

## 2019-04-10 DIAGNOSIS — E349 Endocrine disorder, unspecified: Secondary | ICD-10-CM

## 2019-04-13 ENCOUNTER — Other Ambulatory Visit: Payer: Self-pay

## 2019-04-13 ENCOUNTER — Other Ambulatory Visit: Payer: Medicare Other

## 2019-04-13 DIAGNOSIS — E349 Endocrine disorder, unspecified: Secondary | ICD-10-CM

## 2019-04-14 ENCOUNTER — Ambulatory Visit (INDEPENDENT_AMBULATORY_CARE_PROVIDER_SITE_OTHER): Payer: Medicare Other | Admitting: Family Medicine

## 2019-04-14 DIAGNOSIS — Z23 Encounter for immunization: Secondary | ICD-10-CM | POA: Diagnosis not present

## 2019-04-14 LAB — TESTOSTERONE: Testosterone: 333 ng/dL (ref 264–916)

## 2019-05-09 ENCOUNTER — Other Ambulatory Visit: Payer: Self-pay | Admitting: Family Medicine

## 2019-05-11 MED ORDER — PRAVASTATIN SODIUM 40 MG PO TABS: 40.0000 mg | ORAL_TABLET | Freq: Every day | ORAL | 3 refills | Status: DC

## 2019-05-11 NOTE — Telephone Encounter (Signed)
Sent my way

## 2019-08-14 ENCOUNTER — Other Ambulatory Visit: Payer: Self-pay | Admitting: Family Medicine

## 2019-08-14 DIAGNOSIS — N138 Other obstructive and reflux uropathy: Secondary | ICD-10-CM

## 2019-08-14 DIAGNOSIS — E349 Endocrine disorder, unspecified: Secondary | ICD-10-CM

## 2019-08-17 ENCOUNTER — Other Ambulatory Visit: Payer: Medicare Other

## 2019-08-17 ENCOUNTER — Other Ambulatory Visit: Payer: Self-pay

## 2019-08-17 DIAGNOSIS — E349 Endocrine disorder, unspecified: Secondary | ICD-10-CM

## 2019-08-17 DIAGNOSIS — N138 Other obstructive and reflux uropathy: Secondary | ICD-10-CM

## 2019-08-18 LAB — HEMATOCRIT: Hematocrit: 44.2 % (ref 37.5–51.0)

## 2019-08-18 LAB — HEMOGLOBIN: Hemoglobin: 15.1 g/dL (ref 13.0–17.7)

## 2019-08-18 LAB — PSA: Prostate Specific Ag, Serum: 1.8 ng/mL (ref 0.0–4.0)

## 2019-08-18 LAB — TESTOSTERONE: Testosterone: 316 ng/dL (ref 264–916)

## 2019-08-21 ENCOUNTER — Ambulatory Visit: Payer: Medicare Other | Admitting: Urology

## 2019-08-21 ENCOUNTER — Other Ambulatory Visit: Payer: Self-pay

## 2019-08-21 ENCOUNTER — Encounter: Payer: Self-pay | Admitting: Urology

## 2019-08-21 VITALS — BP 171/84 | HR 70 | Ht 69.0 in | Wt 205.0 lb

## 2019-08-21 DIAGNOSIS — E349 Endocrine disorder, unspecified: Secondary | ICD-10-CM | POA: Diagnosis not present

## 2019-08-21 DIAGNOSIS — N138 Other obstructive and reflux uropathy: Secondary | ICD-10-CM | POA: Diagnosis not present

## 2019-08-21 DIAGNOSIS — N401 Enlarged prostate with lower urinary tract symptoms: Secondary | ICD-10-CM

## 2019-08-21 DIAGNOSIS — N529 Male erectile dysfunction, unspecified: Secondary | ICD-10-CM | POA: Diagnosis not present

## 2019-08-21 MED ORDER — VARDENAFIL HCL 20 MG PO TABS: 20.0000 mg | ORAL_TABLET | Freq: Every day | ORAL | 3 refills | Status: DC | PRN

## 2019-08-21 MED ORDER — TESTOSTERONE CYPIONATE 200 MG/ML IM SOLN: INTRAMUSCULAR | 0 refills | Status: DC

## 2019-08-21 NOTE — Progress Notes (Signed)
08/21/2019 8:59 AM   Curtis [name] [birth year] [id number]  Referring provider: [name], [place],  [place] [address]  Chief Complaint  Patient presents with  . Follow-up   HPI: Patient is a 69 y.o. male with testosterone deficiency, BPH with LUTS and ED who presents today for a 6 months follow up.   Testosterone deficiency He is still having spontaneous erections at night.  His pretreatment labs were: testosterone 228 ng/dl (01/31/2017) and 262 ng/dL (02/04/2017) with normal LH/FSH and prolactin levels.  He is currently managing his testosterone deficiency with testosterone cypionate 200 mg IM, 0.25 cc q2 weeks.  Most recent testosterone (07/2019) level is 316 ng/dL (Goal:450-600 ng/dL). HGB and HCT normal (15.1, 44.2%).  He donates blood every 2 months.  He is not aware of having sleep apnea.    BPH WITH LUTS  (prostate and/or bladder) IPSS score: 9/1    Previous score: 5/0   Previous PVR: 20 mL  Major complaint(s):  None.  Denies any dysuria, hematuria or suprapubic pain.  Denies any recent fevers, chills, nausea or vomiting. He does not have a family history of PCa.  IPSS    Row Name 08/21/19 0800         International Prostate Symptom Score   How often have you had the sensation of not emptying your bladder?  Less than 1 in 5     How often have you had to urinate less than every two hours?  Less than 1 in 5 times     How often have you found you stopped and started again several times when you urinated?  Less than half the time     How often have you found it difficult to postpone urination?  Less than half the time     How often have you had a weak urinary stream?  Less than half the time     How often have you had to strain to start urination?  Less than 1 in 5 times     How many times did you typically get up at night to urinate?  None     Total IPSS Score  9       Quality of Life due to urinary symptoms   If you were to spend the  rest of your life with your urinary condition just the way it is now how would you feel about that?  Pleased        Score:  1-7 Mild 8-19 Moderate 20-35 Severe  Erectile dysfunction SHIM score: 14    Previous SHIM score: 22 Main complaint:  Inconsistent erections x four years Risk factors:  age, BPH, testosterone deficiency, HTN and HLD.    No painful erections or curvatures with his erections.    Still having an occasional spontaneous erections.  Tried:    Could not tolerate PDE5i's (tadalafil and sildenafil) - he could tolerate the [place] Name 08/21/19 0828         SHIM: Over the last 6 months:   How do you rate your confidence that you could get and keep an erection?  Low     When you had erections with sexual stimulation, how often were your erections hard enough for penetration (entering your partner)?  Sometimes (about half the time)     During sexual intercourse, how often were you able to maintain your erection after you had penetrated (entered) your partner?  Sometimes (about  half the time)     During sexual intercourse, how difficult was it to maintain your erection to completion of intercourse?  Difficult     When you attempted sexual intercourse, how often was it satisfactory for you?  Sometimes (about half the time)       SHIM Total Score   SHIM  14        Score: 1-7 Severe ED 8-11 Moderate ED 12-16 Mild-Moderate ED 17-21 Mild ED 22-25 No ED    PMH: Past Medical History:  Diagnosis Date  . HTN (hypertension)   . Hyperlipidemia   . Hypogonadism in male   . Low back pain    Surgical History: Past Surgical History:  Procedure Laterality Date  . ACHILLES TENDON SURGERY Right 1995  . HERNIA REPAIR  2002   double  . Surgery on Left pinky in 2000     Home Medications:  Allergies as of 08/21/2019   No Known Allergies     Medication List       Accurate as of August 21, 2019  8:59 AM. If you have any questions, ask your nurse or  doctor.        STOP taking these medications   loratadine 10 MG tablet Commonly known as: CLARITIN Stopped by: Keshonda Monsour, PA-C   magnesium 30 MG tablet Stopped by: Melodie Ashworth, PA-C     TAKE these medications   BD SafetyGlide Shielded Needle 21G X 1-1/2" Misc Generic drug: NEEDLE (DISP) 21 G 0.5 mLs by Does not apply route every 14 (fourteen) days.   lisinopril-hydrochlorothiazide 20-25 MG tablet Commonly known as: ZESTORETIC Take 1 tablet by mouth daily   pravastatin 40 MG tablet Commonly known as: PRAVACHOL Take 1 tablet (40 mg total) by mouth daily.   SAFETY-GARD Needle 18G Misc Generic drug: Hypodermic Needles-Disposable 0.5 mLs by Does not apply route every 14 (fourteen) days.   Syringe (Disposable) 3 ML Misc 0.5 mLs by Does not apply route every 14 (fourteen) days.   testosterone cypionate 200 MG/ML injection Commonly known as: DEPOTESTOSTERONE CYPIONATE Inject 0.5 cc every fourteen days What changed: See the new instructions. Changed by: Zara Council, PA-C   vardenafil 20 MG tablet Commonly known as: Levitra Take 1 tablet (20 mg total) by mouth daily as needed for erectile dysfunction. Started by: Zara Council, PA-C      Allergies: No Known Allergies  Family History: Family History  Problem Relation Age of Onset  . Hypertension Mother   . Arthritis Mother   . Vision loss Mother        due to age  . Cancer Father        in the liver   Social History:  reports that he has never smoked. He has quit using smokeless tobacco.  His smokeless tobacco use included snuff. He reports current alcohol use of about 2.0 - 4.0 standard drinks of alcohol per week. He reports that he does not use drugs.  ROS: UROLOGY Frequent Urination?: No Hard to postpone urination?: No Burning/pain with urination?: No Get up at night to urinate?: No Leakage of urine?: No Urine stream starts and stops?: No Trouble starting stream?: No Do you have to strain  to urinate?: No Blood in urine?: No Urinary tract infection?: No Sexually transmitted disease?: No Injury to kidneys or bladder?: No Painful intercourse?: No Weak stream?: No Erection problems?: Yes Penile pain?: No  Gastrointestinal Nausea?: No Indigestion/heartburn?: No Diarrhea?: No Constipation?: No  Constitutional Fever: No Night sweats?: No Weight  loss?: No Fatigue?: No  Skin Skin rash/lesions?: No Itching?: No  Eyes Blurred vision?: No Double vision?: No  Ears/Nose/Throat Sore throat?: No Sinus problems?: No  Hematologic/Lymphatic Swollen glands?: No Easy bruising?: No  Cardiovascular Leg swelling?: No Chest pain?: No  Respiratory Cough?: No Shortness of breath?: No  Endocrine Excessive thirst?: No  Musculoskeletal Back pain?: No Joint pain?: No  Neurological Headaches?: No Dizziness?: No  Psychologic Depression?: No Anxiety?: No  Physical Exam: BP (!) 171/84   Pulse 70   Ht 5\' 9"  (1.753 m)   Wt 205 lb (93 kg)   BMI 30.27 kg/m   Constitutional:  Well nourished. Alert and oriented, No acute distress. HEENT:  AT, mask in place.  Trachea midline, no masses. Cardiovascular: No clubbing, cyanosis, or edema. Respiratory: Normal respiratory effort, no increased work of breathing. GI: Abdomen is soft, non tender, non distended, no abdominal masses. Liver and spleen not palpable.  No hernias appreciated.  Stool sample for occult testing is not indicated.   GU: No CVA tenderness.  No bladder fullness or masses.  Patient with circumcised phallus.  Urethral meatus is patent.  No penile discharge. No penile lesions or rashes. Scrotum without lesions, cysts, rashes and/or edema.  Testicles are located scrotally bilaterally, atrophic.  No masses are appreciated in the testicles. Left and right epididymis are normal. Rectal: Patient with  normal sphincter tone. Anus and perineum without scarring or rashes. No rectal masses are appreciated. Prostate  is approximately 45 grams, could only palpate the apex, no nodules are appreciated. Seminal vesicles could not be palpated.   Skin: No rashes, bruises or suspicious lesions. Lymph: No cervical or inguinal adenopathy. Neurologic: Grossly intact, no focal deficits, moving all 4 extremities. Psychiatric: Normal mood and affect.   Laboratory Data: Component     Latest Ref Rng & Units 11/11/2015 01/01/2017 10/28/2017 11/12/2017  Prostate Specific Ag, Serum     0.0 - 4.0 ng/mL 1.6 1.4 2.4 1.7   Component     Latest Ref Rng & Units 02/13/2018 06/24/2018 02/16/2019 08/17/2019  Prostate Specific Ag, Serum     0.0 - 4.0 ng/mL 1.6 1.6 2.0 1.8   Lab Results  Component Value Date   WBC 5.9 01/12/2019   HGB 15.1 08/17/2019   HCT 44.2 08/17/2019   MCV 85 01/12/2019   PLT 172 01/12/2019   Lab Results  Component Value Date   CREATININE 0.89 01/12/2019   Lab Results  Component Value Date   TESTOSTERONE 316 08/17/2019   Lab Results  Component Value Date   TSH 1.010 01/12/2019      Component Value Date/Time   CHOL 194 01/12/2019 1206   HDL 37 (L) 01/12/2019 1206   CHOLHDL 5.0 01/07/2018 0939   LDLCALC 106 (H) 01/12/2019 1206   Lab Results  Component Value Date   AST 26 01/12/2019   Lab Results  Component Value Date   ALT 26 01/12/2019   I have independently reviewed the labs.  Assessment & Plan:    1. Testosterone deficiency  Most recent testosterone 316 (07/2019) level is ng/dL - goal 450-600 ng/dL He will increase his testosterone cypionate 200 mg/cc, 0.5 mg every 14 days We will recheck hematocrit, hemoglobin and serum testosterone in 3 months 1 week after an injection If his hematocrit and hemoglobin continue to increase, I have suggested that he be evaluated for possible sleep apnea  2. BPH with LUTS IPSS score is 9/1, it is slightly worse  Continue conservative management, avoiding bladder irritants  and timed voiding's RTC in 6 months for I PSS, PSA and exam  3. Erectile  dysfunction:    SHIM score is 14, it is worse He states he has tried a Clinical research associate and it was effective - he would like a prescription for the medication RTC in 6 months for SHIM score and exam, as testosterone therapy can affect erections  Return in about 3 months (around 11/19/2019) for Testosterone (one week after injection) [facility] [place]  [place] [place], [place] [address] [phone]

## 2019-08-25 ENCOUNTER — Other Ambulatory Visit: Payer: Self-pay | Admitting: *Deleted

## 2019-08-25 MED ORDER — SILDENAFIL CITRATE 20 MG PO TABS: 20.0000 mg | ORAL_TABLET | ORAL | 11 refills | Status: DC | PRN

## 2019-09-26 ENCOUNTER — Ambulatory Visit: Payer: Medicare Other | Attending: Internal Medicine

## 2019-09-26 DIAGNOSIS — Z23 Encounter for immunization: Secondary | ICD-10-CM | POA: Insufficient documentation

## 2019-09-26 NOTE — Progress Notes (Signed)
   Covid-19 Vaccination Clinic  Name:  Curtis [name]    MRN: [id number] DOB: [birth year]  09/26/2019  [name] was observed post Covid-19 immunization for 15 minutes without incidence. He was provided with Vaccine Information Sheet and instruction to access the V-Safe system.   [name] was instructed to call 911 with any severe reactions post vaccine: [name] Kitchen Difficulty breathing  . Swelling of your face and throat  . A fast heartbeat  . A bad rash all over your body  . Dizziness and weakness    Immunizations Administered    Name Date Dose VIS Date Route   Moderna COVID-19 Vaccine 09/26/2019 10:15 AM 0.5 mL 06/30/2019 Intramuscular   Manufacturer: Moderna   Lot: [id number]   [place]:[id number]

## 2019-10-24 ENCOUNTER — Ambulatory Visit: Payer: Medicare Other | Attending: Internal Medicine

## 2019-10-24 DIAGNOSIS — Z23 Encounter for immunization: Secondary | ICD-10-CM

## 2019-10-24 NOTE — Progress Notes (Signed)
   Covid-19 Vaccination Clinic  Name:  Curtis [name]    MRN: [id number] DOB: 22-Nov-1950  10/24/2019  [name] was observed post Covid-19 immunization for 15 minutes without incident. He was provided with Vaccine Information Sheet and instruction to access the V-Safe system.   [name] was instructed to call 911 with any severe reactions post vaccine: [name] Kitchen Difficulty breathing  . Swelling of face and throat  . A fast heartbeat  . A bad rash all over body  . Dizziness and weakness   Immunizations Administered    Name Date Dose VIS Date Route   Moderna COVID-19 Vaccine 10/24/2019  1:40 PM 0.5 mL 06/30/2019 Intramuscular   Manufacturer: Levan Hurst   LotUD:[id number]   [place]:[id number]

## 2019-10-27 ENCOUNTER — Ambulatory Visit: Payer: Medicare Other

## 2019-11-19 ENCOUNTER — Other Ambulatory Visit: Payer: Self-pay

## 2019-11-25 ENCOUNTER — Other Ambulatory Visit: Payer: Self-pay

## 2019-11-25 DIAGNOSIS — E349 Endocrine disorder, unspecified: Secondary | ICD-10-CM

## 2019-11-26 ENCOUNTER — Other Ambulatory Visit: Payer: Self-pay

## 2019-11-26 ENCOUNTER — Other Ambulatory Visit: Payer: Medicare Other

## 2019-11-26 DIAGNOSIS — E349 Endocrine disorder, unspecified: Secondary | ICD-10-CM

## 2019-11-27 LAB — PSA: Prostate Specific Ag, Serum: 1.8 ng/mL (ref 0.0–4.0)

## 2019-11-27 LAB — HEMATOCRIT: Hematocrit: 45.6 % (ref 37.5–51.0)

## 2019-11-27 LAB — TESTOSTERONE: Testosterone: 491 ng/dL (ref 264–916)

## 2019-11-27 LAB — HEMOGLOBIN: Hemoglobin: 15.7 g/dL (ref 13.0–17.7)

## 2019-11-30 ENCOUNTER — Telehealth: Payer: Self-pay | Admitting: Family Medicine

## 2019-11-30 DIAGNOSIS — N138 Other obstructive and reflux uropathy: Secondary | ICD-10-CM

## 2019-11-30 DIAGNOSIS — E349 Endocrine disorder, unspecified: Secondary | ICD-10-CM

## 2019-11-30 NOTE — Telephone Encounter (Signed)
Left message on voicemail, informed patient to contact our office in August to schedule a lab appointment for bloodwork. He will need the appointment to be 1 week after Testosterone injection. Orders for lab are in.

## 2019-11-30 NOTE — Telephone Encounter (Signed)
-----   Message from Nori Riis, PA-C sent at 11/30/2019  8:14 AM EDT ----- Please let Mr. Mench know that his labs look good.  We will need to see him in three months with HCT, hgb, PSA and testosterone one week after an injection.

## 2019-12-09 ENCOUNTER — Other Ambulatory Visit: Payer: Self-pay | Admitting: Family Medicine

## 2019-12-09 DIAGNOSIS — I10 Essential (primary) hypertension: Secondary | ICD-10-CM

## 2019-12-09 MED ORDER — LISINOPRIL-HYDROCHLOROTHIAZIDE 20-25 MG PO TABS: 1.0000 | ORAL_TABLET | Freq: Every day | ORAL | 1 refills | Status: DC

## 2019-12-09 NOTE — Telephone Encounter (Signed)
Requested medication (s) are due for refill today: Yes  Requested medication (s) are on the active medication list: Yes  Last refill:  11/13/18  Future visit scheduled: Yes  Notes to clinic:  Prescription has expired.    Requested Prescriptions  Pending Prescriptions Disp Refills   lisinopril-hydrochlorothiazide (ZESTORETIC) 20-25 MG tablet 90 tablet 3    Sig: Take 1 tablet by mouth daily.      Cardiovascular:  ACEI + Diuretic Combos Failed - 12/09/2019 10:59 AM      Failed - Na in normal range and within 180 days    Sodium  Date Value Ref Range Status  01/12/2019 142 134 - 144 mmol/L Final          Failed - K in normal range and within 180 days    Potassium  Date Value Ref Range Status  01/12/2019 4.0 3.5 - 5.2 mmol/L Final          Failed - Cr in normal range and within 180 days    Creatinine, Ser  Date Value Ref Range Status  01/12/2019 0.89 0.76 - 1.27 mg/dL Final          Failed - Ca in normal range and within 180 days    Calcium  Date Value Ref Range Status  01/12/2019 9.2 8.6 - 10.2 mg/dL Final          Failed - Last BP in normal range    BP Readings from Last 1 Encounters:  08/21/19 (!) 171/84          Failed - Valid encounter within last 6 months    Recent Outpatient Visits           11 months ago Essential hypertension   [facility], [name], [place]   1 year ago Chronic left shoulder pain   [facility], [name], [place]   1 year ago Annual physical exam   [facility], [name], [place]   2 years ago Essential hypertension   [facility], [name], [place]   2 years ago [facility] to [employer] preventive visit   [facility], [name], [place]              Passed - Patient is not pregnant

## 2019-12-09 NOTE — Telephone Encounter (Signed)
Copied from [place] [id number]. Topic: Quick Communication - Rx Refill/Question >> Dec 09, 2019 10:54 AM Curtis [name] wrote: Medication: lisinopril-hydrochlorothiazide (ZESTORETIC) 20-25 MG tablet   Has the patient contacted their pharmacy? Yes.   (Agent: If no, request that the patient contact the pharmacy for the refill.) (Agent: If yes, when and what did the pharmacy advise?)  Preferred Pharmacy (with phone number or street name): [id number] - [place], [place] - [place]  Phone:  [phone] Fax:  [phone]     Agent: Please be advised that RX refills may take up to 3 business days. We ask that you follow-up with your pharmacy.

## 2019-12-14 ENCOUNTER — Other Ambulatory Visit: Payer: Self-pay

## 2019-12-14 ENCOUNTER — Ambulatory Visit (INDEPENDENT_AMBULATORY_CARE_PROVIDER_SITE_OTHER): Payer: Medicare Other | Admitting: Dermatology

## 2019-12-14 DIAGNOSIS — L578 Other skin changes due to chronic exposure to nonionizing radiation: Secondary | ICD-10-CM | POA: Diagnosis not present

## 2019-12-14 DIAGNOSIS — D2239 Melanocytic nevi of other parts of face: Secondary | ICD-10-CM

## 2019-12-14 DIAGNOSIS — L820 Inflamed seborrheic keratosis: Secondary | ICD-10-CM

## 2019-12-14 DIAGNOSIS — Z86018 Personal history of other benign neoplasm: Secondary | ICD-10-CM

## 2019-12-14 DIAGNOSIS — Z1283 Encounter for screening for malignant neoplasm of skin: Secondary | ICD-10-CM

## 2019-12-14 DIAGNOSIS — D229 Melanocytic nevi, unspecified: Secondary | ICD-10-CM

## 2019-12-14 DIAGNOSIS — D489 Neoplasm of uncertain behavior, unspecified: Secondary | ICD-10-CM

## 2019-12-14 DIAGNOSIS — L821 Other seborrheic keratosis: Secondary | ICD-10-CM

## 2019-12-14 DIAGNOSIS — D485 Neoplasm of uncertain behavior of skin: Secondary | ICD-10-CM | POA: Diagnosis not present

## 2019-12-14 DIAGNOSIS — L814 Other melanin hyperpigmentation: Secondary | ICD-10-CM

## 2019-12-14 HISTORY — DX: Personal history of other benign neoplasm: Z86.018

## 2019-12-14 NOTE — Progress Notes (Signed)
Follow-Up Visit   Subjective  Curtis [name] is a 69 y.o. male who presents for the following: TBSE (has some spots that he would like removed if possible on B/L shoulders, left armpit, and just above the navel, they have been there for months, They are are irritated at this time).  He tends to pick at them.  No prior history of skin cancer.  Last year he had a benign skin tag and ISK removed by biopsy.  The following portions of the chart were reviewed this encounter and updated as appropriate:     Review of Systems:  No other skin or systemic complaints except as noted in HPI or Assessment and Plan.  Objective  Well appearing patient in no apparent distress; mood and affect are within normal limits.  A full examination was performed including scalp, head, eyes, ears, nose, lips, neck, chest, axillae, abdomen, back, buttocks, bilateral upper extremities, bilateral lower extremities, hands, feet, fingers, toes, fingernails, and toenails. All findings within normal limits unless otherwise noted below.  Objective  Left Shoulder - Anterior, Right Axilla, Right Shoulder - Anterior: keratotic waxy stuck-on papule  Objective  Right Supra umbilicus: 4 mm red papule     Objective  Left Lower Flank: 74mm brown macule slight irregular pigment       Objective  Right chin: Flesh-colored papule R chin.   Assessment & Plan  Inflamed seborrheic keratosis (3) Left Shoulder - Anterior; Right Shoulder - Anterior; Right Axilla  Destruction of lesion - Left Shoulder - Anterior, Right Axilla, Right Shoulder - Anterior  Destruction method: cryotherapy   Informed consent: discussed and consent obtained   Lesion destroyed using liquid nitrogen: Yes   Region frozen until ice ball extended beyond lesion: Yes   Outcome: patient tolerated procedure well with no complications   Post-procedure details: wound care instructions given    Neoplasm of uncertain behavior (2) Right Supra  umbilicus  Epidermal / dermal shaving  Lesion length (cm):  0.4 Lesion width (cm):  0.4 Margin per side (cm):  0.1 Total excision diameter (cm):  0.6 Informed consent: discussed and consent obtained   Instrument used: flexible razor blade   Hemostasis achieved with: pressure and aluminum chloride   Outcome: patient tolerated procedure well   Post-procedure details: wound care instructions given   Additional details:  Mupirocin and bandaid applied  Specimen 1 - Surgical pathology Differential Diagnosis: Irritated hemangioma vs other Check Margins: No 4 mm red papule  Left Lower Flank  Epidermal / dermal shaving  Lesion length (cm):  0.6 Lesion width (cm):  0.6 Margin per side (cm):  0.1 Total excision diameter (cm):  0.8 Informed consent: discussed and consent obtained   Instrument used: flexible razor blade   Hemostasis achieved with: pressure and aluminum chloride   Outcome: patient tolerated procedure well   Post-procedure details: wound care instructions given   Additional details:  Mupirocin and bandaid applied  Specimen 2 - Surgical pathology Differential Diagnosis: nevus vs isk  R/O dysplasia Check Margins: No 24mm brown macules slight irregular pigment  Nevus Right chin  Benign, observe.     Lentigines - Scattered tan macules - Discussed due to sun exposure - Benign, observe - Call for any changes  Seborrheic Keratoses - Stuck-on, waxy, tan-brown papules and plaques  - Discussed benign etiology and prognosis. - Observe - Call for any changes  Melanocytic Nevi - Tan-brown and/or pink-flesh-colored symmetric macules and papules - Benign appearing on exam today - Observation - Call clinic  for new or changing moles - Recommend daily use of broad spectrum spf 30+ sunscreen to sun-exposed areas.   Hemangiomas - Red papules - Discussed benign nature - Observe - Call for any changes  Actinic Damage - diffuse scaly erythematous macules with  underlying dyspigmentation - Recommend daily broad spectrum sunscreen SPF 30+ to sun-exposed areas, reapply every 2 hours as needed.  - Call for new or changing lesions.  Skin cancer screening performed today.   Return in about 1 year (around 12/13/2020) for TBSE.  I, Donzetta Kohut, CMA, am acting as scribe for Brendolyn Patty, MD .  Documentation: I have reviewed the above documentation for accuracy and completeness, and I agree with the above.  Brendolyn Patty MD

## 2019-12-14 NOTE — Patient Instructions (Addendum)
Wound Care Instructions  1. Cleanse wound gently with soap and water once a day then pat dry with clean gauze. Apply a thing coat of Petrolatum (petroleum jelly, "Vaseline") over the wound (unless you have an allergy to this). We recommend that you use a new, sterile tube of Vaseline. Do not pick or remove scabs. Do not remove the yellow or white "healing tissue" from the base of the wound.  2. Cover the wound with fresh, clean, nonstick gauze and secure with paper tape. You may use Band-Aids in place of gauze and tape if the would is small enough, but would recommend trimming much of the tape off as there is often too much. Sometimes Band-Aids can irritate the skin.  3. You should call the office for your biopsy report after 1 week if you have not already been contacted.  4. If you experience any problems, such as abnormal amounts of bleeding, swelling, significant bruising, significant pain, or evidence of infection, please call the office immediately.  5. FOR ADULT SURGERY PATIENTS: If you need something for pain relief you may take 1 extra strength Tylenol (acetaminophen) AND 2 Ibuprofen (200mg each) together every 4 hours as needed for pain. (do not take these if you are allergic to them or if you have a reason you should not take them.) Typically, you may only need pain medication for 1 to 3 days.    Cryotherapy Aftercare  . Wash gently with soap and water everyday.   . Apply Vaseline and Band-Aid daily until healed.  Recommend daily broad spectrum sunscreen SPF 30+ to sun-exposed areas, reapply every 2 hours as needed. Call for new or changing lesions.  

## 2019-12-16 ENCOUNTER — Telehealth: Payer: Self-pay

## 2019-12-16 NOTE — Telephone Encounter (Signed)
Advised pt of bx results.  Advised pt to keep his f/u for annual skin exam and Dr. Nicole Kindred would recheck the Dysplastic nevus on the L lower flank./sh

## 2019-12-16 NOTE — Telephone Encounter (Signed)
-----   Message from Brendolyn Patty, MD sent at 12/15/2019  6:36 PM EDT ----- 1. Skin , right supra umbilicus HEMANGIOMA 2. 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 Benign 2. Atypical mole, will observe for recurrence

## 2020-01-12 ENCOUNTER — Ambulatory Visit: Payer: Medicare Other

## 2020-01-21 NOTE — Progress Notes (Addendum)
Subjective:   Curtis [name] is a 69 y.o. male who presents for Medicare Annual/Subsequent preventive examination.  I connected with Darald Uzzle today by telephone and verified that I am speaking with the correct person using two identifiers. Location patient: home Location provider: work Persons participating in the virtual visit: patient, provider.   I discussed the limitations, risks, security and privacy concerns of performing an evaluation and management service by telephone and the availability of in person appointments. I also discussed with the patient that there may be a patient responsible charge related to this service. The patient expressed understanding and verbally consented to this telephonic visit.    Interactive audio and video telecommunications were attempted between this provider and patient, however failed, due to patient having technical difficulties OR patient did not have access to video capability.  We continued and completed visit with audio only.   Review of Systems    N/A  Cardiac Risk Factors include: advanced age (>49men, >43 women);dyslipidemia;male gender;hypertension     Objective:    Today's Vitals   01/25/20 0919  PainSc: 0-No pain   There is no height or weight on file to calculate BMI.  Advanced Directives 01/25/2020 01/06/2019 01/02/2018  Does Patient Have a Medical Advance Directive? Yes Yes Yes  Type of Paramedic of [place];Living will Living will;Healthcare Power of [place];Living will  Copy of [place] in Chart? No - copy requested No - copy requested No - copy requested    Current Medications (verified) Outpatient Encounter Medications as of 01/25/2020  Medication Sig   Hypodermic Needles-Disposable (SAFETY-GARD NEEDLE 18G) MISC 0.5 mLs by Does not apply route every 14 (fourteen) days.   lisinopril-hydrochlorothiazide (ZESTORETIC) 20-25 MG tablet Take  1 tablet by mouth daily.   NEEDLE, DISP, 21 G (BD SAFETYGLIDE SHIELDED NEEDLE) 21G X 1-1/2" MISC 0.5 mLs by Does not apply route every 14 (fourteen) days.   NON FORMULARY Gout Relief 1/2 oz daily   pravastatin (PRAVACHOL) 40 MG tablet Take 1 tablet (40 mg total) by mouth daily.   sildenafil (REVATIO) 20 MG tablet Take 1 tablet (20 mg total) by mouth as needed. Take 3-5 tablets as needed prior to intercouse   Syringe, Disposable, 3 ML MISC 0.5 mLs by Does not apply route every 14 (fourteen) days.   testosterone cypionate (DEPOTESTOSTERONE CYPIONATE) 200 MG/ML injection Inject 0.5 cc every fourteen days   vardenafil (LEVITRA) 20 MG tablet Take 1 tablet (20 mg total) by mouth daily as needed for erectile dysfunction.   No facility-administered encounter medications on file as of 01/25/2020.    Allergies (verified) Patient has no known allergies.   History: Past Medical History:  Diagnosis Date   Arthritis    Gout    HTN (hypertension)    Hyperlipidemia    Hypogonadism in male    Low back pain    Morton neuroma, left    Past Surgical History:  Procedure Laterality Date   ACHILLES TENDON SURGERY Right 1995   HERNIA REPAIR  2002   double   Surgery on Left pinky in 2000     Family History  Problem Relation Age of Onset   Hypertension Mother    Arthritis Mother    Vision loss Mother        due to age   25 Father        in the liver   Social History   Socioeconomic History   Marital status: Married  Spouse name: Not on file   Number of children: 2   Years of education: Not on file   Highest education level: Bachelor's degree (e.g., BA, AB, BS)  Occupational History   Occupation: Camera operator    Comment: retired  Tobacco Use   Smoking status: Never Smoker   Smokeless tobacco: Former Systems developer    Types: Snuff   Tobacco comment: quit snuff at age 1  Vaping Use   Vaping Use: Never used  Substance and Sexual Activity   Alcohol use: Yes    Alcohol/week: 2.0 -  4.0 standard drinks    Types: 2 - 4 Cans of beer per week   Drug use: No   Sexual activity: Not on file  Other Topics Concern   Not on file  Social History Narrative   Not on file   Social Determinants of Health   Financial Resource Strain: Low Risk    Difficulty of Paying Living Expenses: Not hard at all  Food Insecurity: No Food Insecurity   Worried About Charity fundraiser in the Last Year: Never true   Ran Out of Food in the Last Year: Never true  Transportation Needs: No Transportation Needs   Lack of Transportation (Medical): No   Lack of Transportation (Non-Medical): No  Physical Activity: Sufficiently Active   Days of Exercise per Week: 3 days   Minutes of Exercise per Session: 60 min  Stress: No Stress Concern Present   Feeling of Stress : Not at all  Social Connections: Moderately Integrated   Frequency of Communication with Friends and Family: More than three times a week   Frequency of Social Gatherings with Friends and Family: More than three times a week   Attends Religious Services: More than 4 times per year   Active Member of [employer] or Organizations: No   Attends Music therapist: Never   Marital Status: Married    Tobacco Counseling Counseling given: Not Answered Comment: quit snuff at age 62   Clinical Intake:  Pre-visit preparation completed: Yes  Pain : No/denies pain Pain Score: 0-No pain     Nutritional Risks: None Diabetes: No  How often do you need to have someone help you when you read instructions, pamphlets, or other written materials from your doctor or pharmacy?: 1 - Never  Diabetic? No  Interpreter Needed?: No  Information entered by :: [facility], LPN   Activities of Daily Living In your present state of health, do you have any difficulty performing the following activities: 01/25/2020  Hearing? N  Vision? N  Comment Wears eye glasses.  Difficulty concentrating or making decisions? N  Walking or climbing  stairs? N  Dressing or bathing? N  Doing errands, shopping? N  Preparing Food and eating ? N  Using the Toilet? N  In the past six months, have you accidently leaked urine? N  Do you have problems with loss of bowel control? N  Managing your Medications? N  Managing your Finances? N  Housekeeping or managing your Housekeeping? N  Some recent data might be hidden    Patient Care Team: Chrismon, [name], PA as PCP - General (Physician Assistant) Laneta Simmers as Physician Assistant (Urology) Sharlotte Alamo, DPM (Podiatry) Coll, Venda Rodes. (Orthopedic Surgery) Brendolyn Patty, MD (Dermatology) Pa, [facility])  Indicate any recent Medical Services you may have received from other than [facility] providers in the past year (date may be approximate).     Assessment:  This is a routine wellness examination for [place].  Hearing/Vision screen No exam data present  Dietary issues and exercise activities discussed: Current Exercise Habits: Home exercise routine, Type of exercise: walking, Time (Minutes): 60, Frequency (Times/Week): 3 (to 4 days a week), Weekly Exercise (Minutes/Week): 180, Intensity: Mild, Exercise limited by: None identified  Goals      DIET - REDUCE SUGAR INTAKE     Recommend cutting back in sweets and desserts in diet and monitor sugar intake.        Depression Screen PHQ 2/9 Scores 01/25/2020 01/06/2019 01/06/2019 01/02/2018 01/01/2017 11/11/2015  PHQ - 2 Score 0 0 0 0 0 0  PHQ- 9 Score - 0 - - 0 -    Fall Risk Fall Risk  01/25/2020 01/06/2019 01/02/2018 01/01/2017  Falls in the past year? 0 0 No No  Number falls in past yr: 0 - - -  Injury with Fall? 0 - - -    Any stairs in or around the home? No  If so, are there any without handrails?  N/A Home free of loose throw rugs in walkways, pet beds, electrical cords, etc? Yes  Adequate lighting in your home to reduce risk of falls? Yes   ASSISTIVE DEVICES UTILIZED TO PREVENT  FALLS:  Life alert? No  Use of a cane, walker or w/c? No  Grab bars in the bathroom? Yes  Shower chair or bench in shower? No  Elevated toilet seat or a handicapped toilet? No   Cognitive Function:     6CIT Screen 01/25/2020 01/06/2019 01/02/2018  What Year? 0 points 0 points 0 points  What month? 0 points 0 points 0 points  What time? 0 points 0 points 0 points  Count back from 20 0 points 0 points 0 points  Months in reverse 0 points 0 points 0 points  Repeat phrase 0 points 0 points 4 points  Total Score 0 0 4    Immunizations Immunization History  Administered Date(s) Administered   Fluad Quad(high Dose 65+) 04/14/2019   Influenza-Unspecified 05/07/2017, 04/25/2018   Moderna SARS-COVID-2 Vaccination 09/26/2019, 10/24/2019   Pneumococcal Conjugate-13 01/01/2017   Pneumococcal Polysaccharide-23 01/02/2018   Td 08/06/2001   Tdap 10/03/2010   Zoster 11/11/2015    TDAP status: Up to date Flu Vaccine status: Up to date Pneumococcal vaccine status: Up to date Covid-19 vaccine status: Completed vaccines  Qualifies for Shingles Vaccine? Yes   Zostavax completed Yes   Shingrix Completed?: No.    Education has been provided regarding the importance of this vaccine. Patient has been advised to call insurance company to determine out of pocket expense if they have not yet received this vaccine. Advised may also receive vaccine at local pharmacy or Health Dept. Verbalized acceptance and understanding.  Screening Tests Health Maintenance  Topic Date Due   INFLUENZA VACCINE  02/28/2020   TETANUS/TDAP  10/02/2020   COLONOSCOPY  05/14/2027   COVID-19 Vaccine  Completed   Hepatitis C Screening  Completed   PNA vac Low Risk Adult  Completed    Health Maintenance  There are no preventive care reminders to display for this patient.  Colorectal cancer screening: Completed 05/13/17. Repeat every 10 years  Lung Cancer Screening: (Low Dose CT Chest recommended if Age 48-80 years, 30  pack-year currently smoking OR have quit w/in 15years.) does not qualify.   Additional Screening:  Hepatitis C Screening: Up to date  Vision Screening: Recommended annual ophthalmology exams for early detection of glaucoma and other disorders of  the eye. Is the patient up to date with their annual eye exam?  Yes  Who is the provider or what is the name of the office in which the patient attends annual eye exams? [place] If pt is not established with a provider, would they like to be referred to a provider to establish care? No .   Dental Screening: Recommended annual dental exams for proper oral hygiene  Community Resource Referral / Chronic Care Management: CRR required this visit?  No   CCM required this visit?  No      Plan:     I have personally reviewed and noted the following in the patient's chart:   Medical and social history Use of alcohol, tobacco or illicit drugs  Current medications and supplements Functional ability and status Nutritional status Physical activity Advanced directives List of other physicians Hospitalizations, surgeries, and ER visits in previous 12 months Vitals Screenings to include cognitive, depression, and falls Referrals and appointments  In addition, I have reviewed and discussed with patient certain preventive protocols, quality metrics, and best practice recommendations. A written personalized care plan for preventive services as well as general preventive health recommendations were provided to patient.     Daci Stubbe [place], [place]   10/20/1989   Nurse Notes: None.  Reviewed screening by Nurse Health Advisor. Was available for consultation. Agree with recommendations and plan.

## 2020-01-25 ENCOUNTER — Other Ambulatory Visit: Payer: Self-pay

## 2020-01-25 ENCOUNTER — Ambulatory Visit (INDEPENDENT_AMBULATORY_CARE_PROVIDER_SITE_OTHER): Payer: Medicare Other

## 2020-01-25 DIAGNOSIS — Z0000 Encounter for general adult medical examination without abnormal findings: Secondary | ICD-10-CM | POA: Diagnosis not present

## 2020-01-25 NOTE — Patient Instructions (Signed)
Curtis [name] , Thank you for taking time to come for your Medicare Wellness Visit. I appreciate your ongoing commitment to your health goals. Please review the following plan we discussed and let me know if I can assist you in the future.   Screening recommendations/referrals: Colonoscopy: Up to date, due 04/2027 Recommended yearly ophthalmology/optometry visit for glaucoma screening and checkup Recommended yearly dental visit for hygiene and checkup  Vaccinations: Influenza vaccine: done 04/14/19 Pneumococcal vaccine: Completed series Tdap vaccine: Up to date, due 09/2020 Shingles vaccine: Currently due, declined today.    Advanced directives: Please bring a copy of your POA (Power of Attorney) and/or Living Will to your next appointment.   Conditions/risks identified: Recommend cutting back in sweets and desserts in diet and monitor sugar intake.   Next appointment: 01/28/20 @ 9:20 AM with [place] 65 Years and Older, Male Preventive care refers to lifestyle choices and visits with your health care provider that can promote health and wellness. What does preventive care include?  A yearly physical exam. This is also called an annual well check.  Dental exams once or twice a year.  Routine eye exams. Ask your health care provider how often you should have your eyes checked.  Personal lifestyle choices, including:  Daily care of your teeth and gums.  Regular physical activity.  Eating a healthy diet.  Avoiding tobacco and drug use.  Limiting alcohol use.  Practicing safe sex.  Taking low doses of aspirin every day.  Taking vitamin and mineral supplements as recommended by your health care provider. What happens during an annual well check? The services and screenings done by your health care provider during your annual well check will depend on your age, overall health, lifestyle risk factors, and family history of disease. Counseling  Your health  care provider may ask you questions about your:  Alcohol use.  Tobacco use.  Drug use.  Emotional well-being.  Home and relationship well-being.  Sexual activity.  Eating habits.  History of falls.  Memory and ability to understand (cognition).  Work and work Statistician. Screening  You may have the following tests or measurements:  Height, weight, and BMI.  Blood pressure.  Lipid and cholesterol levels. These may be checked every 5 years, or more frequently if you are over 21 years old.  Skin check.  Lung cancer screening. You may have this screening every year starting at age 63 if you have a 30-pack-year history of smoking and currently smoke or have quit within the past 15 years.  Fecal occult blood test (FOBT) of the stool. You may have this test every year starting at age 71.  Flexible sigmoidoscopy or colonoscopy. You may have a sigmoidoscopy every 5 years or a colonoscopy every 10 years starting at age 11.  Prostate cancer screening. Recommendations will vary depending on your family history and other risks.  Hepatitis C blood test.  Hepatitis B blood test.  Sexually transmitted disease (STD) testing.  Diabetes screening. This is done by checking your blood sugar (glucose) after you have not eaten for a while (fasting). You may have this done every 1-3 years.  Abdominal aortic aneurysm (AAA) screening. You may need this if you are a current or former smoker.  Osteoporosis. You may be screened starting at age 41 if you are at high risk. Talk with your health care provider about your test results, treatment options, and if necessary, the need for more tests. Vaccines  Your health care provider  may recommend certain vaccines, such as:  Influenza vaccine. This is recommended every year.  Tetanus, diphtheria, and acellular pertussis (Tdap, Td) vaccine. You may need a Td booster every 10 years.  Zoster vaccine. You may need this after age  83.  Pneumococcal 13-valent conjugate (PCV13) vaccine. One dose is recommended after age 14.  Pneumococcal polysaccharide (PPSV23) vaccine. One dose is recommended after age 78. Talk to your health care provider about which screenings and vaccines you need and how often you need them. This information is not intended to replace advice given to you by your health care provider. Make sure you discuss any questions you have with your health care provider. Document Released: 08/12/2015 Document Revised: 04/04/2016 Document Reviewed: 05/17/2015 Elsevier Interactive Patient Education  2017 [employer] Prevention in the Home Falls can cause injuries. They can happen to people of all ages. There are many things you can do to make your home safe and to help prevent falls. What can I do on the outside of my home?  Regularly fix the edges of walkways and driveways and fix any cracks.  Remove anything that might make you trip as you walk through a door, such as a raised step or threshold.  Trim any bushes or trees on the path to your home.  Use bright outdoor lighting.  Clear any walking paths of anything that might make someone trip, such as rocks or tools.  Regularly check to see if handrails are loose or broken. Make sure that both sides of any steps have handrails.  Any raised decks and porches should have guardrails on the edges.  Have any leaves, snow, or ice cleared regularly.  Use sand or salt on walking paths during winter.  Clean up any spills in your garage right away. This includes oil or grease spills. What can I do in the bathroom?  Use night lights.  Install grab bars by the toilet and in the tub and shower. Do not use towel bars as grab bars.  Use non-skid mats or decals in the tub or shower.  If you need to sit down in the shower, use a plastic, non-slip stool.  Keep the floor dry. Clean up any water that spills on the floor as soon as it happens.  Remove  soap buildup in the tub or shower regularly.  Attach bath mats securely with double-sided non-slip rug tape.  Do not have throw rugs and other things on the floor that can make you trip. What can I do in the bedroom?  Use night lights.  Make sure that you have a light by your bed that is easy to reach.  Do not use any sheets or blankets that are too big for your bed. They should not hang down onto the floor.  Have a firm chair that has side arms. You can use this for support while you get dressed.  Do not have throw rugs and other things on the floor that can make you trip. What can I do in the kitchen?  Clean up any spills right away.  Avoid walking on wet floors.  Keep items that you use a lot in easy-to-reach places.  If you need to reach something above you, use a strong step stool that has a grab bar.  Keep electrical cords out of the way.  Do not use floor polish or wax that makes floors slippery. If you must use wax, use non-skid floor wax.  Do not have throw rugs  and other things on the floor that can make you trip. What can I do with my stairs?  Do not leave any items on the stairs.  Make sure that there are handrails on both sides of the stairs and use them. Fix handrails that are broken or loose. Make sure that handrails are as long as the stairways.  Check any carpeting to make sure that it is firmly attached to the stairs. Fix any carpet that is loose or worn.  Avoid having throw rugs at the top or bottom of the stairs. If you do have throw rugs, attach them to the floor with carpet tape.  Make sure that you have a light switch at the top of the stairs and the bottom of the stairs. If you do not have them, ask someone to add them for you. What else can I do to help prevent falls?  Wear shoes that:  Do not have high heels.  Have rubber bottoms.  Are comfortable and fit you well.  Are closed at the toe. Do not wear sandals.  If you use a  stepladder:  Make sure that it is fully opened. Do not climb a closed stepladder.  Make sure that both sides of the stepladder are locked into place.  Ask someone to hold it for you, if possible.  Clearly mark and make sure that you can see:  Any grab bars or handrails.  First and last steps.  Where the edge of each step is.  Use tools that help you move around (mobility aids) if they are needed. These include:  Canes.  Walkers.  Scooters.  Crutches.  Turn on the lights when you go into a dark area. Replace any light bulbs as soon as they burn out.  Set up your furniture so you have a clear path. Avoid moving your furniture around.  If any of your floors are uneven, fix them.  If there are any pets around you, be aware of where they are.  Review your medicines with your doctor. Some medicines can make you feel dizzy. This can increase your chance of falling. Ask your doctor what other things that you can do to help prevent falls. This information is not intended to replace advice given to you by your health care provider. Make sure you discuss any questions you have with your health care provider. Document Released: 05/12/2009 Document Revised: 12/22/2015 Document Reviewed: 08/20/2014 Elsevier Interactive Patient Education  2017 [employer].

## 2020-01-27 NOTE — Progress Notes (Signed)
Complete physical exam   Patient: Curtis [name]   DOB: [birth year]   69 y.o. Male  MRN: [id number] Visit Date: 01/28/2020  Today's healthcare provider: [name], PA   Chief Complaint  Patient presents with   Annual Exam   Subjective    [name] is a 69 y.o. male who presents today for a complete physical exam.  He reports consuming a general diet. Home exercise routine includes walking 3-4 times weekly. He generally feels fairly well. He reports sleeping fairly well. He does have additional problems to discuss today.    HPI  Had AWV with [facility] on 01/25/2020  Past Medical History:  Diagnosis Date   Arthritis    Gout    HTN (hypertension)    Hyperlipidemia    Hypogonadism in male    Low back pain    Morton neuroma, left    Past Surgical History:  Procedure Laterality Date   ACHILLES TENDON SURGERY Right 1995   HERNIA REPAIR  2002   double   Surgery on Left pinky in 2000     Social History   Socioeconomic History   Marital status: Married    Spouse name: Not on file   Number of children: 2   Years of education: Not on file   Highest education level: Bachelor's degree (e.g., BA, AB, BS)  Occupational History   Occupation: Camera operator    Comment: retired  Tobacco Use   Smoking status: Never Smoker   Smokeless tobacco: Former Systems developer    Types: Snuff   Tobacco comment: quit snuff at age 38  Vaping Use   Vaping Use: Never used  Substance and Sexual Activity   Alcohol use: Yes    Alcohol/week: 2.0 - 4.0 standard drinks    Types: 2 - 4 Cans of beer per week   Drug use: No   Sexual activity: Not on file  Other Topics Concern   Not on file  Social History Narrative   Not on file   Social Determinants of Health   Financial Resource Strain: Low Risk    Difficulty of Paying Living Expenses: Not hard at all  Food Insecurity: No Food Insecurity   Worried About Charity fundraiser in the Last Year:  Never true   Ran Out of Food in the Last Year: Never true  Transportation Needs: No Transportation Needs   Lack of Transportation (Medical): No   Lack of Transportation (Non-Medical): No  Physical Activity: Sufficiently Active   Days of Exercise per Week: 3 days   Minutes of Exercise per Session: 60 min  Stress: No Stress Concern Present   Feeling of Stress : Not at all  Social Connections: Moderately Integrated   Frequency of Communication with Friends and Family: More than three times a week   Frequency of Social Gatherings with Friends and Family: More than three times a week   Attends Religious Services: More than 4 times per year   Active Member of [employer] or Organizations: No   Attends Archivist Meetings: Never   Marital Status: Married  Human resources officer Violence: Not At Risk   Fear of Current or Ex-Partner: No   Emotionally Abused: No   Physically Abused: No   Sexually Abused: No   Family Status  Relation Name Status   Mother  Alive   Father  Deceased at age 38       died from cancer in the liver   Brother  Alive  Daughter #1 Alive   Daughter #2 Alive   Family History  Problem Relation Age of Onset   Hypertension Mother    Arthritis Mother    Vision loss Mother        due to age   Cancer Father        in the liver   No Known Allergies  Patient Care Team: Tywon Niday, [name], [place] as PCP - General (Physician Assistant) Nori Riis PA-C as Physician Assistant (Urology) Sharlotte Alamo, [place] (Podiatry) Coll, Venda Rodes., PA-C (Orthopedic Surgery) Brendolyn Patty, MD (Dermatology) Pa, [place] (Optometry)   Medications: Outpatient Medications Prior to Visit  Medication Sig   Apoaequorin (PREVAGEN PO) Take 1 tablet by mouth daily.   Hypodermic Needles-Disposable (SAFETY-GARD NEEDLE 18G) MISC 0.5 mLs by Does not apply route every 14 (fourteen) days.   lisinopril-hydrochlorothiazide (ZESTORETIC) 20-25 MG tablet  Take 1 tablet by mouth daily.   Multiple Vitamin (MULTIVITAMIN) tablet Take 1 tablet by mouth daily.   NEEDLE, DISP, 21 G (BD SAFETYGLIDE SHIELDED NEEDLE) 21G X 1-1/2" MISC 0.5 mLs by Does not apply route every 14 (fourteen) days.   NON FORMULARY Gout Relief 1/2 oz daily   PINE BARK, PYCNOGENOL, PO Take 100 mg by mouth in the morning and at bedtime.   pravastatin (PRAVACHOL) 40 MG tablet Take 1 tablet (40 mg total) by mouth daily.   sildenafil (REVATIO) 20 MG tablet Take 1 tablet (20 mg total) by mouth as needed. Take 3-5 tablets as needed prior to intercouse   Syringe, Disposable, 3 ML MISC 0.5 mLs by Does not apply route every 14 (fourteen) days.   testosterone cypionate (DEPOTESTOSTERONE CYPIONATE) 200 MG/ML injection Inject 0.5 cc every fourteen days   [DISCONTINUED] vardenafil (LEVITRA) 20 MG tablet Take 1 tablet (20 mg total) by mouth daily as needed for erectile dysfunction.   No facility-administered medications prior to visit.    Review of Systems  Constitutional: Negative for appetite change, chills, fatigue and fever.  HENT: Negative for congestion, ear pain, hearing loss, nosebleeds and trouble swallowing.   Eyes: Positive for itching. Negative for pain and visual disturbance.  Respiratory: Negative for cough, chest tightness and shortness of breath.   Cardiovascular: Negative for chest pain, palpitations and leg swelling.  Gastrointestinal: Negative for abdominal pain, blood in stool, constipation, diarrhea, nausea and vomiting.  Endocrine: Negative for polydipsia, polyphagia and polyuria.  Genitourinary: Negative for dysuria and flank pain.  Musculoskeletal: Positive for back pain. Negative for arthralgias, joint swelling, myalgias and neck stiffness.  Skin: Negative for color change, rash and wound.  Allergic/Immunologic: Positive for environmental allergies.  Neurological: Negative for dizziness, tremors, seizures, speech difficulty, weakness, light-headedness and  headaches.  Psychiatric/Behavioral: Negative for behavioral problems, confusion, decreased concentration, dysphoric mood and sleep disturbance. The patient is not nervous/anxious.   All other systems reviewed and are negative.    Objective    BP (!) 175/70 (BP Location: Left Arm, Cuff Size: Large)    Pulse 69    Temp (!) 97.3 F (36.3 C) (Temporal)    Resp 16    Wt 200 lb (90.7 kg)    BMI 29.53 kg/m  BP Readings from Last 3 Encounters:  01/28/20 (!) 175/70  08/21/19 (!) 171/84  02/17/19 (!) 181/84   Wt Readings from Last 3 Encounters:  01/28/20 200 lb (90.7 kg)  08/21/19 205 lb (93 kg)  02/17/19 207 lb (93.9 kg)     Physical Exam Constitutional:      Appearance:  He is well-developed.  HENT:     Head: Normocephalic and atraumatic.     Right Ear: External ear normal.     Left Ear: External ear normal.     Nose: Nose normal.  Eyes:     General:        Right eye: No discharge.     Conjunctiva/sclera: Conjunctivae normal.     Pupils: Pupils are equal, round, and reactive to light.  Neck:     Thyroid: No thyromegaly.     Trachea: No tracheal deviation.  Cardiovascular:     Rate and Rhythm: Normal rate and regular rhythm.     Heart sounds: Normal heart sounds. No murmur heard.   Pulmonary:     Effort: Pulmonary effort is normal. No respiratory distress.     Breath sounds: Normal breath sounds. No wheezing or rales.  Chest:     Chest wall: No tenderness.  Abdominal:     General: There is no distension.     Palpations: Abdomen is soft. There is no mass.     Tenderness: There is no abdominal tenderness. There is no guarding or rebound.  Genitourinary:    Comments: Deferred to urologist. Musculoskeletal:        General: No tenderness. Normal range of motion.     Cervical back: Normal range of motion and neck supple.     Comments: Tophaceous gout in the left great toe joint without redness or significant pain.  Lymphadenopathy:     Cervical: No cervical adenopathy.    Skin:    General: Skin is warm and dry.     Findings: No erythema or rash.  Neurological:     Mental Status: He is alert and oriented to person, place, and time.     Cranial Nerves: No cranial nerve deficit.     Motor: No abnormal muscle tone.     Coordination: Coordination normal.     Deep Tendon Reflexes: Reflexes are normal and symmetric. Reflexes normal.  Psychiatric:        Behavior: Behavior normal.        Thought Content: Thought content normal.        Judgment: Judgment normal.     Last depression screening scores PHQ 2/9 Scores 01/25/2020 01/06/2019 01/06/2019  PHQ - 2 Score 0 0 0  PHQ- 9 Score - 0 -   Last fall risk screening Fall Risk  01/25/2020  Falls in the past year? 0  Number falls in past yr: 0  Injury with Fall? 0   Last Audit-C alcohol use screening Alcohol Use Disorder Test (AUDIT) 01/25/2020  1. How often do you have a drink containing alcohol? 2  2. How many drinks containing alcohol do you have on a typical day when you are drinking? 0  3. How often do you have six or more drinks on one occasion? 0  AUDIT-C Score 2  4. How often during the last year have you found that you were not able to stop drinking once you had started? -  5. How often during the last year have you failed to do what was normally expected from you because of drinking? -  6. How often during the last year have you needed a first drink in the morning to get yourself going after a heavy drinking session? -  7. How often during the last year have you had a feeling of guilt of remorse after drinking? -  8. How often during the last year have you  been unable to remember what happened the night before because you had been drinking? -  9. Have you or someone else been injured as a result of your drinking? -  10. Has a relative or friend or a doctor or another health worker been concerned about your drinking or suggested you cut down? -  Alcohol Use Disorder Identification Test Final Score (AUDIT)  -  Alcohol Brief Interventions/Follow-up AUDIT Score <7 follow-up not indicated   A score of 3 or more in women, and 4 or more in men indicates increased risk for alcohol abuse, EXCEPT if all of the points are from question 1   No results found for any visits on 01/28/20.  Assessment & Plan    Routine Health Maintenance and Physical Exam  Exercise Activities and Dietary recommendations Goals     DIET - REDUCE SUGAR INTAKE     Recommend cutting back in sweets and desserts in diet and monitor sugar intake.        Immunization History  Administered Date(s) Administered   Fluad Quad(high Dose 65+) 04/14/2019   Influenza-Unspecified 05/07/2017, 04/25/2018   Moderna SARS-COVID-2 Vaccination 09/26/2019, 10/24/2019   Pneumococcal Conjugate-13 01/01/2017   Pneumococcal Polysaccharide-23 01/02/2018   Td 08/06/2001   Tdap 10/03/2010   Zoster 11/11/2015    Health Maintenance  Topic Date Due   INFLUENZA VACCINE  02/28/2020   TETANUS/TDAP  10/02/2020   COLONOSCOPY  05/14/2027   COVID-19 Vaccine  Completed   Hepatitis C Screening  Completed   PNA vac Low Risk Adult  Completed    Discussed health benefits of physical activity, and encouraged him to engage in regular exercise appropriate for his age and condition.  1. Essential hypertension BP usually high when checked in the office - "White Coat Syndrome" - and better at home. Will continue present dose of Zestoretic and get follow up labs. General health stable. Has completed COVID vaccination. Given anticipatory counseling. - lisinopril-hydrochlorothiazide (ZESTORETIC) 20-25 MG tablet; Take 1 tablet by mouth daily.  Dispense: 90 tablet; Refill: 3 - CBC with Differential/Platelet - Comprehensive metabolic panel - Lipid panel - TSH  2. Mixed hyperlipidemia Tolerating Pravastatin 40 mg qd without side effects. Continue low fat diet and recheck follow up labs. - Comprehensive metabolic panel - Lipid panel -  TSH  3. Testosterone deficiency Feeling better with 1/2 cc injection of Depotestosterone every 14 days. Will check CMP and continue follow up with urologist (Dr. Ernestine Conrad). 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 Enlarged joint with tophaceous gout. Recheck uric acid level and may need Allopurinol with purine restricted diet. - Uric acid   No follow-ups on file.     Andres Shad, PA, have reviewed all documentation for this visit. The documentation on 01/28/20 for the exam, diagnosis, procedures, and orders are all accurate and complete.    [name], [place] [phone] (phone) [phone] (fax)  [place]

## 2020-01-28 ENCOUNTER — Other Ambulatory Visit: Payer: Self-pay

## 2020-01-28 ENCOUNTER — Ambulatory Visit (INDEPENDENT_AMBULATORY_CARE_PROVIDER_SITE_OTHER): Payer: Medicare Other | Admitting: Family Medicine

## 2020-01-28 ENCOUNTER — Encounter: Payer: Self-pay | Admitting: Family Medicine

## 2020-01-28 VITALS — BP 175/70 | HR 69 | Temp 97.3°F | Resp 16 | Wt 200.0 lb

## 2020-01-28 DIAGNOSIS — E782 Mixed hyperlipidemia: Secondary | ICD-10-CM

## 2020-01-28 DIAGNOSIS — M1A9XX1 Chronic gout, unspecified, with tophus (tophi): Secondary | ICD-10-CM

## 2020-01-28 DIAGNOSIS — M79645 Pain in left finger(s): Secondary | ICD-10-CM

## 2020-01-28 DIAGNOSIS — I10 Essential (primary) hypertension: Secondary | ICD-10-CM | POA: Diagnosis not present

## 2020-01-28 DIAGNOSIS — E349 Endocrine disorder, unspecified: Secondary | ICD-10-CM

## 2020-01-28 MED ORDER — LISINOPRIL-HYDROCHLOROTHIAZIDE 20-25 MG PO TABS: 1.0000 | ORAL_TABLET | Freq: Every day | ORAL | 3 refills | Status: DC

## 2020-01-29 ENCOUNTER — Encounter: Payer: Self-pay | Admitting: Family Medicine

## 2020-01-29 LAB — CBC WITH DIFFERENTIAL/PLATELET
Basophils Absolute: 0 10*3/uL (ref 0.0–0.2)
Basos: 1 %
EOS (ABSOLUTE): 0.1 10*3/uL (ref 0.0–0.4)
Eos: 1 %
Hematocrit: 45.6 % (ref 37.5–51.0)
Hemoglobin: 15.1 g/dL (ref 13.0–17.7)
Immature Grans (Abs): 0 10*3/uL (ref 0.0–0.1)
Immature Granulocytes: 0 %
Lymphocytes Absolute: 1.8 10*3/uL (ref 0.7–3.1)
Lymphs: 36 %
MCH: 28.9 pg (ref 26.6–33.0)
MCHC: 33.1 g/dL (ref 31.5–35.7)
MCV: 87 fL (ref 79–97)
Monocytes Absolute: 0.8 10*3/uL (ref 0.1–0.9)
Monocytes: 16 %
Neutrophils Absolute: 2.3 10*3/uL (ref 1.4–7.0)
Neutrophils: 46 %
Platelets: 151 10*3/uL (ref 150–450)
RBC: 5.23 x10E6/uL (ref 4.14–5.80)
RDW: 12.7 % (ref 11.6–15.4)
WBC: 4.9 10*3/uL (ref 3.4–10.8)

## 2020-01-29 LAB — COMPREHENSIVE METABOLIC PANEL
ALT: 24 IU/L (ref 0–44)
AST: 26 IU/L (ref 0–40)
Albumin/Globulin Ratio: 2.4 — ABNORMAL HIGH (ref 1.2–2.2)
Albumin: 4.7 g/dL (ref 3.8–4.8)
Alkaline Phosphatase: 43 IU/L — ABNORMAL LOW (ref 48–121)
BUN/Creatinine Ratio: 15 (ref 10–24)
BUN: 14 mg/dL (ref 8–27)
Bilirubin Total: 0.4 mg/dL (ref 0.0–1.2)
CO2: 22 mmol/L (ref 20–29)
Calcium: 8.8 mg/dL (ref 8.6–10.2)
Chloride: 103 mmol/L (ref 96–106)
Creatinine, Ser: 0.95 mg/dL (ref 0.76–1.27)
GFR calc Af Amer: 95 mL/min/{1.73_m2} (ref >59)
GFR calc non Af Amer: 82 mL/min/{1.73_m2} (ref >59)
Globulin, Total: 2 g/dL (ref 1.5–4.5)
Glucose: 99 mg/dL (ref 65–99)
Potassium: 3.8 mmol/L (ref 3.5–5.2)
Sodium: 140 mmol/L (ref 134–144)
Total Protein: 6.7 g/dL (ref 6.0–8.5)

## 2020-01-29 LAB — TSH: TSH: 1.21 u[IU]/mL (ref 0.450–4.500)

## 2020-01-29 LAB — LIPID PANEL
Chol/HDL Ratio: 4.3 ratio (ref 0.0–5.0)
Cholesterol, Total: 177 mg/dL (ref 100–199)
HDL: 41 mg/dL (ref >39)
LDL Chol Calc (NIH): 105 mg/dL — ABNORMAL HIGH (ref 0–99)
Triglycerides: 178 mg/dL — ABNORMAL HIGH (ref 0–149)
VLDL Cholesterol Cal: 31 mg/dL (ref 5–40)

## 2020-01-29 LAB — URIC ACID: Uric Acid: 8.5 mg/dL — ABNORMAL HIGH (ref 3.8–8.4)

## 2020-01-29 NOTE — Patient Instructions (Signed)

## 2020-02-02 ENCOUNTER — Telehealth: Payer: Self-pay

## 2020-02-02 MED ORDER — ALLOPURINOL 100 MG PO TABS
100.0000 mg | ORAL_TABLET | Freq: Every day | ORAL | 3 refills | Status: DC
Start: 2020-02-02 — End: 2020-07-27

## 2020-02-02 NOTE — Telephone Encounter (Signed)
-----   Message from [name], [place] sent at 01/29/2020  5:33 PM EDT ----- Blood tests essentially normal except uric acid level above normal range. Should consider Allopurinol 100 mg qd #90 & 3 RF and low purine diet. Triglycerides a little above the goal of <150 but the cholesterol is in good shape. Continue Pravastatin 40 mg qd. Recheck levels in 4-6 months.

## 2020-02-02 NOTE — Telephone Encounter (Signed)
Called to advise patient of lab results, LVMTCB. If patient calls back it is okay for someone else to advise.  °

## 2020-02-02 NOTE — Telephone Encounter (Signed)
Pt. Given results and instructions. Verbalizes understanding. Please send Allopurinol to his pharmacy.

## 2020-03-01 ENCOUNTER — Ambulatory Visit: Payer: Medicare Other | Admitting: Family Medicine

## 2020-03-01 ENCOUNTER — Other Ambulatory Visit: Payer: Self-pay | Admitting: Family Medicine

## 2020-03-01 ENCOUNTER — Ambulatory Visit: Payer: Self-pay

## 2020-03-01 ENCOUNTER — Other Ambulatory Visit: Payer: Self-pay

## 2020-03-01 ENCOUNTER — Encounter: Payer: Self-pay | Admitting: Family Medicine

## 2020-03-01 VITALS — BP 122/82 | HR 76 | Ht 69.0 in | Wt 204.0 lb

## 2020-03-01 DIAGNOSIS — M7552 Bursitis of left shoulder: Secondary | ICD-10-CM

## 2020-03-01 DIAGNOSIS — M1A09X1 Idiopathic chronic gout, multiple sites, with tophus (tophi): Secondary | ICD-10-CM

## 2020-03-01 DIAGNOSIS — M25512 Pain in left shoulder: Secondary | ICD-10-CM | POA: Diagnosis not present

## 2020-03-01 DIAGNOSIS — G8929 Other chronic pain: Secondary | ICD-10-CM

## 2020-03-01 DIAGNOSIS — M109 Gout, unspecified: Secondary | ICD-10-CM | POA: Insufficient documentation

## 2020-03-01 MED ORDER — ALLOPURINOL 100 MG PO TABS
200.0000 mg | ORAL_TABLET | Freq: Every day | ORAL | 0 refills | Status: DC
Start: 2020-03-01 — End: 2020-03-01

## 2020-03-01 MED ORDER — COLCHICINE 0.6 MG PO TABS
0.6000 mg | ORAL_TABLET | Freq: Every day | ORAL | 0 refills | Status: DC
Start: 2020-03-01 — End: 2020-07-27

## 2020-03-01 NOTE — Assessment & Plan Note (Signed)
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 Increase allopurinol 200mg  daily and tart cherry given as well. Discussed HEP, diet, hydration, RTC in 4- 8 weeks

## 2020-03-01 NOTE — Progress Notes (Signed)
Curtis [name] [place] Phone: [phone] Subjective:   [name], am serving as a scribe for Dr. Hulan Saas. This visit occurred during the SARS-CoV-2 public health emergency.  Safety protocols were in place, including screening questions prior to the visit, additional usage of staff PPE, and extensive cleaning of exam room while observing appropriate contact time as indicated for disinfecting solutions.   I'm seeing this patient by the request  of:  Curtis [name], Curtis Muff, PA  CC: Low back pain, shoulder pain foot pain  GYI:[id number]  Curtis [name] is a 69 y.o. male coming in with complaint of back, left shoulder and foot pain. Patient states back pain is chronic.   Two years ago patient was using a walk behind heavy mower. Pain over AC joint. Has pain at night especially with lying on his left side. Feels a loss of strength but believes it is due to fracture pinky finger that is unstable.   Also having bilateral foot pain. Gout in left foot. In right foot pain over lateral plantar aspect of foot. Walks for exercise and pain increases with weight bearing.   Xray lumbar 2019 IMPRESSION: Mild degenerative disc disease changes lumbar spine.  Left shoulder xray 2019 IMPRESSION: Mild LEFT glenohumeral degenerative changes.    Past medical history significant for testosterone deficiency and, gout and essential hypertension  Past Medical History:  Diagnosis Date  . Arthritis   . Gout   . HTN (hypertension)   . Hyperlipidemia   . Hypogonadism in male   . Low back pain   . Morton neuroma, left    Past Surgical History:  Procedure Laterality Date  . ACHILLES TENDON SURGERY Right 1995  . HERNIA REPAIR  2002   double  . Surgery on Left pinky in 2000     Social History   Socioeconomic History  . Marital status: Married    Spouse name: Not on file  . Number of children: 2  . Years of education: Not on  file  . Highest education level: Bachelor's degree (e.g., BA, AB, BS)  Occupational History  . Occupation: Camera operator    Comment: retired  Tobacco Use  . Smoking status: Never Smoker  . Smokeless tobacco: Former Systems developer    Types: Snuff  . Tobacco comment: quit snuff at age 60  Vaping Use  . Vaping Use: Never used  Substance and Sexual Activity  . Alcohol use: Yes    Alcohol/week: 2.0 - 4.0 standard drinks    Types: 2 - 4 Cans of beer per week  . Drug use: No  . Sexual activity: Not on file  Other Topics Concern  . Not on file  Social History Narrative  . Not on file   Social Determinants of Health   Financial Resource Strain: Low Risk   . Difficulty of Paying Living Expenses: Not hard at all  Food Insecurity: No Food Insecurity  . Worried About Charity fundraiser in the Last Year: Never true  . Ran Out of Food in the Last Year: Never true  Transportation Needs: No Transportation Needs  . Lack of Transportation (Medical): No  . Lack of Transportation (Non-Medical): No  Physical Activity: Sufficiently Active  . Days of Exercise per Week: 3 days  . Minutes of Exercise per Session: 60 min  Stress: No Stress Concern Present  . Feeling of Stress : Not at all  Social Connections: Moderately Integrated  . Frequency of  Communication with Friends and Family: More than three times a week  . Frequency of Social Gatherings with Friends and Family: More than three times a week  . Attends Religious Services: More than 4 times per year  . Active Member of Clubs or Organizations: No  . Attends Archivist Meetings: Never  . Marital Status: Married   No Known Allergies Family History  Problem Relation Age of Onset  . Hypertension Mother   . Arthritis Mother   . Vision loss Mother        due to age  . Cancer Father        in the liver    Current Outpatient Medications (Endocrine & Metabolic):  .  testosterone cypionate (DEPOTESTOSTERONE CYPIONATE) 200 MG/ML  injection, Inject 0.5 cc every fourteen days  Current Outpatient Medications (Cardiovascular):  .  lisinopril-hydrochlorothiazide (ZESTORETIC) 20-25 MG tablet, Take 1 tablet by mouth daily. .  pravastatin (PRAVACHOL) 40 MG tablet, Take 1 tablet (40 mg total) by mouth daily. .  sildenafil (REVATIO) 20 MG tablet, Take 1 tablet (20 mg total) by mouth as needed. Take 3-5 tablets as needed prior to intercouse   Current Outpatient Medications (Analgesics):  .  allopurinol (ZYLOPRIM) 100 MG tablet, Take 1 tablet (100 mg total) by mouth daily. [name] Kitchen  allopurinol (ZYLOPRIM) 100 MG tablet, TAKE 2 TABLETS(200 MG) BY MOUTH DAILY .  colchicine 0.6 MG tablet, Take 1 tablet (0.6 mg total) by mouth daily.   Current Outpatient Medications (Other):  .  Apoaequorin (PREVAGEN PO), Take 1 tablet by mouth daily. .  Hypodermic Needles-Disposable (SAFETY-GARD NEEDLE 18G) MISC, 0.5 mLs by Does not apply route every 14 (fourteen) days. .  Multiple Vitamin (MULTIVITAMIN) tablet, Take 1 tablet by mouth daily. [name] Kitchen  NEEDLE, DISP, 21 G (BD SAFETYGLIDE SHIELDED NEEDLE) 21G X 1-1/2" MISC, 0.5 mLs by Does not apply route every 14 (fourteen) days. .  NON FORMULARY, Gout Relief 1/2 oz daily .  PINE BARK, PYCNOGENOL, PO, Take 100 mg by mouth in the morning and at bedtime. .  Syringe, Disposable, 3 ML MISC, 0.5 mLs by Does not apply route every 14 (fourteen) days.   Reviewed prior external information including notes and imaging from  primary care provider As well as notes that were available from care everywhere and other healthcare systems.  Past medical history, social, surgical and family history all reviewed in electronic medical record.  No pertanent information unless stated regarding to the chief complaint.   Review of Systems:  No headache, visual changes, nausea, vomiting, diarrhea, constipation, dizziness, abdominal pain, skin rash, fevers, chills, night sweats, weight loss, swollen lymph nodes, , joint swelling,  chest pain, shortness of breath, mood changes. POSITIVE muscle aches, body aches   Objective  Blood pressure 122/82, pulse 76, height 5\' 9"  (1.753 m), weight 204 lb (92.5 kg), SpO2 97 %.   General: No apparent distress alert and oriented x3 mood and affect normal, dressed appropriately.  HEENT: Pupils equal, extraocular movements intact  Respiratory: Patient's speak in full sentences and does not appear short of breath  Cardiovascular: No lower extremity edema, non tender, no erythema  Neuro: Cranial nerves II through XII are intact, neurovascularly intact in all extremities with 2+ DTRs and 2+ pulses.  Gait normal with good balance and coordination.  MSK: Left shoulder exam significant for tenderness to palpation.  Some mild swelling noted over the acromioclavicular joint.  Positive impingement.  Rotator cuff strength of 5 out of 5 compared to contralateral side.  Tophi noted on the patient's fifth pinky finger as well as tophi noted over the first toe on the left foot.  Mild warmness and redness noted.  After informed written and verbal consent, patient was seated on exam table. Left shoulder was prepped with alcohol swab and utilizing posterior approach, patient's right glenohumeral space was injected with 4:1  marcaine 0.5%: Kenalog 40mg /dL. Patient tolerated the procedure well without immediate complications.   [id number]; 15 additional minutes spent for Therapeutic exercises as stated in above notes.  This included exercises focusing on stretching, strengthening, with significant focus on eccentric aspects.   Long term goals include an improvement in range of motion, strength, endurance as well as avoiding reinjury. Patient's frequency would include in 1-2 times a day, 3-5 times a week for a duration of 6-12 weeks. Shoulder Exercises that included:  Basic scapular stabilization to include adduction and depression of scapula Scaption, focusing on proper movement and good control Internal and  External rotation utilizing a theraband, with elbow tucked at side entire time Rows with theraband   Proper technique shown and discussed handout in great detail with ATC.  All questions were discussed and answered.     Impression and Recommendations:     The above documentation has been reviewed and is accurate and complete Lyndal Pulley, DO       Note: This dictation was prepared with Dragon dictation along with smaller phrase technology. Any transcriptional errors that result from this process are unintentional.

## 2020-03-01 NOTE — Assessment & Plan Note (Signed)
Patient given injection today.  Tolerated the procedure well.  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  May need to consider injection there as well but I anticipate patient doing relatively well.  Also he does have inflammation noted.  Increase activity slowly.  Follow-up again in 4 to 8 weeks

## 2020-03-01 NOTE — Patient Instructions (Addendum)
Tart cherry extract pills 1200mg  at night Allopurinol 200mg  daily Colchicine if flare or cheat on diet take a pill Vit D 2000IU See me again in 5-6 weeks

## 2020-03-29 ENCOUNTER — Other Ambulatory Visit: Payer: Medicare Other

## 2020-03-29 ENCOUNTER — Other Ambulatory Visit: Payer: Self-pay

## 2020-03-29 DIAGNOSIS — E349 Endocrine disorder, unspecified: Secondary | ICD-10-CM

## 2020-03-29 DIAGNOSIS — N138 Other obstructive and reflux uropathy: Secondary | ICD-10-CM

## 2020-03-30 LAB — TESTOSTERONE: Testosterone: 418 ng/dL (ref 264–916)

## 2020-03-30 LAB — PSA: Prostate Specific Ag, Serum: 2.2 ng/mL (ref 0.0–4.0)

## 2020-03-30 LAB — HEMOGLOBIN: Hemoglobin: 15.2 g/dL (ref 13.0–17.7)

## 2020-03-30 LAB — HEMATOCRIT: Hematocrit: 44.2 % (ref 37.5–51.0)

## 2020-04-04 NOTE — Progress Notes (Signed)
Curtis [name] Phone: [phone] Subjective:   Curtis [name], am serving as a scribe for Dr. Hulan Saas. This visit occurred during the SARS-CoV-2 public health emergency.  Safety protocols were in place, including screening questions prior to the visit, additional usage of staff PPE, and extensive cleaning of exam room while observing appropriate contact time as indicated for disinfecting solutions.   I'm seeing this patient by the request  of:  Chrismon, [name], PA  CC: Left shoulder pain follow-up  [id number]  03/01/2020 Patient given injection today.  Tolerated the procedure well.  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  May need to consider injection there as well but I anticipate patient doing relatively well.  Also he does have inflammation noted.  Increase activity slowly.  Follow-up again in 4 to 8 weeksPatient given injection today.  Tolerated the procedure well.  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  May need to consider injection there as well but I anticipate patient doing relatively well.  Also he does have inflammation noted.  Increase activity slowly.  Follow-up again in 4 to 8 weeks  Update 04/05/2020 [name] is a 69 y.o. male coming in with complaint of left shoulder. Patient states that his pain is about the same as last visit. Pain with lying on left shoulder. Is doing band strength. Feels gout medication is helping his over all body aches. Is also using tart cherry extract at night.      Past Medical History:  Diagnosis Date  . Arthritis   . Gout   . HTN (hypertension)   . Hyperlipidemia   . Hypogonadism in male   . Low back pain   . Morton neuroma, left    Past Surgical History:  Procedure Laterality Date  . ACHILLES  TENDON SURGERY Right 1995  . HERNIA REPAIR  2002   double  . Surgery on Left pinky in 2000     Social History   Socioeconomic History  . Marital status: Married    Spouse name: Not on file  . Number of children: 2  . Years of education: Not on file  . Highest education level: Bachelor's degree (e.g., BA, AB, BS)  Occupational History  . Occupation: Camera operator    Comment: retired  Tobacco Use  . Smoking status: Never Smoker  . Smokeless tobacco: Former Systems developer    Types: Snuff  . Tobacco comment: quit snuff at age 38  Vaping Use  . Vaping Use: Never used  Substance and Sexual Activity  . Alcohol use: Yes    Alcohol/week: 2.0 - 4.0 standard drinks    Types: 2 - 4 Cans of beer per week  . Drug use: [name]  . Sexual activity: Not on file  Other Topics Concern  . Not on file  Social History Narrative  . Not on file   Social Determinants of Health   Financial Resource Strain: Low Risk   . Difficulty of Paying Living Expenses: Not hard at all  Food Insecurity: [name] Food Insecurity  . Worried About Charity fundraiser in the Last Year: Never true  . Ran Out of Food in the Last Year: Never true  Transportation Needs: [name] Transportation Needs  . Lack of Transportation (Medical): [name]  .  Lack of Transportation (Non-Medical): [name]  Physical Activity: Sufficiently Active  . Days of Exercise per Week: 3 days  . Minutes of Exercise per Session: 60 min  Stress: [name] Stress Concern Present  . Feeling of Stress : Not at all  Social Connections: Moderately Integrated  . Frequency of Communication with Friends and Family: More than three times a week  . Frequency of Social Gatherings with Friends and Family: More than three times a week  . Attends Religious Services: More than 4 times per year  . Active Member of Clubs or Organizations: [name]  . Attends Archivist Meetings: Never  . Marital Status: Married   [name] Known Allergies Family History  Problem Relation Age of Onset  .  Hypertension Mother   . Arthritis Mother   . Vision loss Mother        due to age  . Cancer Father        in the liver    Current Outpatient Medications (Endocrine & Metabolic):  .  testosterone cypionate (DEPOTESTOSTERONE CYPIONATE) 200 MG/ML injection, Inject 0.5 cc every fourteen days  Current Outpatient Medications (Cardiovascular):  .  lisinopril-hydrochlorothiazide (ZESTORETIC) 20-25 MG tablet, Take 1 tablet by mouth daily. .  pravastatin (PRAVACHOL) 40 MG tablet, Take 1 tablet (40 mg total) by mouth daily. .  sildenafil (REVATIO) 20 MG tablet, Take 1 tablet (20 mg total) by mouth as needed. Take 3-5 tablets as needed prior to intercouse   Current Outpatient Medications (Analgesics):  .  allopurinol (ZYLOPRIM) 100 MG tablet, Take 1 tablet (100 mg total) by mouth daily. [name] Kitchen  allopurinol (ZYLOPRIM) 100 MG tablet, TAKE 2 TABLETS(200 MG) BY MOUTH DAILY .  colchicine 0.6 MG tablet, Take 1 tablet (0.6 mg total) by mouth daily.   Current Outpatient Medications (Other):  .  Apoaequorin (PREVAGEN PO), Take 1 tablet by mouth daily. .  Hypodermic Needles-Disposable (SAFETY-GARD NEEDLE 18G) MISC, 0.5 mLs by Does not apply route every 14 (fourteen) days. .  Multiple Vitamin (MULTIVITAMIN) tablet, Take 1 tablet by mouth daily. [name] Kitchen  NEEDLE, DISP, 21 G (BD SAFETYGLIDE SHIELDED NEEDLE) 21G X 1-1/2" MISC, 0.5 mLs by Does not apply route every 14 (fourteen) days. .  NON FORMULARY, Gout Relief 1/2 oz daily .  PINE BARK, PYCNOGENOL, PO, Take 100 mg by mouth in the morning and at bedtime. .  Syringe, Disposable, 3 ML MISC, 0.5 mLs by Does not apply route every 14 (fourteen) days.   Reviewed prior external information including notes and imaging from  primary care provider As well as notes that were available from care everywhere and other healthcare systems.  Past medical history, social, surgical and family history all reviewed in electronic medical record.  [name] pertanent information unless stated  regarding to the chief complaint.   Review of Systems:  [name] headache, visual changes, nausea, vomiting, diarrhea, constipation, dizziness, abdominal pain, skin rash, fevers, chills, night sweats, weight loss, swollen lymph nodes, body aches, joint swelling, chest pain, shortness of breath, mood changes. POSITIVE muscle aches  Objective  There were [name] vitals taken for this visit.   General: [name] apparent distress alert and oriented x3 mood and affect normal, dressed appropriately.  HEENT: Pupils equal, extraocular movements intact  Respiratory: Patient's speak in full sentences and does not appear short of breath  Cardiovascular: [name] lower extremity edema, non tender, [name] erythema  Neuro: Cranial nerves II through XII are intact, neurovascularly intact in all extremities with 2+ DTRs and 2+ pulses.  Gait normal with  good balance and coordination.  MSK:  Left shoulder exam shows the patient does have some limited range of motion especially with external range of motion.  Positive crossover.  4+ out of 5 strength of the rotator cuff compared to the contralateral side.  Negative O'Brien's though.  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  Still some gouty deposits noted in the area.  Patient subscapularis significant calcific changes noted.  Bicep tendon unremarkable but severe arthritic changes of the Acromioclavicular Joint   Procedure: Real-time Ultrasound Guided Injection of the left acromioclavicular joint Device: GE Logiq Q7 Ultrasound guided injection is preferred based studies that show increased duration, increased effect, greater accuracy, decreased procedural pain, increased response rate, and decreased cost with ultrasound guided versus blind injection.  Verbal informed  consent obtained.  Time-out conducted.  Noted [name] overlying erythema, induration, or other signs of local infection.  Skin prepped in a sterile fashion.  Local anesthesia: Topical Ethyl chloride.  With sterile technique and under real time ultrasound guidance: With a 25-gauge half inch needle injected with 0.5 cc of 0.5% Marcaine and 0.5 cc of Kenalog 40 mg/mL Completed without difficulty  Pain immediately resolved suggesting accurate placement of the medication.  Advised to call if fevers/chills, erythema, induration, drainage, or persistent bleeding.  Images permanently stored and available for review in the ultrasound unit.  Impression: Technically successful ultrasound guided injection.    Impression and Recommendations:     The above documentation has been reviewed and is accurate and complete Lyndal Pulley, DO       Note: This dictation was prepared with Dragon dictation along with smaller phrase technology. Any transcriptional errors that result from this process are unintentional.

## 2020-04-05 ENCOUNTER — Other Ambulatory Visit: Payer: Self-pay

## 2020-04-05 ENCOUNTER — Encounter: Payer: Self-pay | Admitting: Family Medicine

## 2020-04-05 ENCOUNTER — Ambulatory Visit: Payer: Medicare Other | Admitting: Family Medicine

## 2020-04-05 ENCOUNTER — Ambulatory Visit: Payer: Self-pay

## 2020-04-05 VITALS — BP 130/84 | HR 72 | Ht 69.0 in | Wt 199.0 lb

## 2020-04-05 DIAGNOSIS — G8929 Other chronic pain: Secondary | ICD-10-CM

## 2020-04-05 DIAGNOSIS — M19012 Primary osteoarthritis, left shoulder: Secondary | ICD-10-CM

## 2020-04-05 DIAGNOSIS — M7552 Bursitis of left shoulder: Secondary | ICD-10-CM | POA: Diagnosis not present

## 2020-04-05 DIAGNOSIS — M1A09X1 Idiopathic chronic gout, multiple sites, with tophus (tophi): Secondary | ICD-10-CM

## 2020-04-05 DIAGNOSIS — M25512 Pain in left shoulder: Secondary | ICD-10-CM | POA: Diagnosis not present

## 2020-04-05 DIAGNOSIS — M19019 Primary osteoarthritis, unspecified shoulder: Secondary | ICD-10-CM | POA: Insufficient documentation

## 2020-04-05 NOTE — Assessment & Plan Note (Signed)
Patient seems to be doing better.  Patient does have some underlying glenohumeral arthritis that likely is contributing to some of the decrease in range of motion.  Encourage patient to continue to increase activity were tolerated by keeping hands within peripheral vision.  Patient should continue to make improvement and follow-up with me again in 4 to 8 weeks

## 2020-04-05 NOTE — Patient Instructions (Signed)
Good to see you. Injected acromioclavicular joint today. Continue home exercises I do expect still another 20 to 50% improvement. Continue the medications for gout I expect clinically continuing to help as well, See me again in 6 to 8 weeks

## 2020-04-05 NOTE — Assessment & Plan Note (Signed)
Patient's gout seems to be better controlled at this moment.  Should consider rechecking in the next 6 to 8 weeks with laboratory findings.  Patient is on the allopurinol and the tart cherry.  Patient states that back pain is significantly better since starting this regimen.

## 2020-04-05 NOTE — Assessment & Plan Note (Signed)
Injection given today, tolerated the procedure well, discussed keeping hands within peripheral glucose, continue to work with the range of motion follow-up again in 6 to 8 weeks

## 2020-04-06 ENCOUNTER — Telehealth: Payer: Self-pay | Admitting: *Deleted

## 2020-04-06 NOTE — Telephone Encounter (Signed)
left message on DPR and made appt.

## 2020-04-06 NOTE — Telephone Encounter (Signed)
-----   Message from Nori Riis, PA-C sent at 04/05/2020  9:06 PM EDT ----- Please let Mr. Prout know that his lab work looked good and we need to see him in 6 months for lab work (testosterone -one week after injection, HCT, Hgb and PSA) and exam

## 2020-04-07 ENCOUNTER — Ambulatory Visit: Payer: Medicare Other | Admitting: Family Medicine

## 2020-05-01 ENCOUNTER — Other Ambulatory Visit: Payer: Self-pay | Admitting: Family Medicine

## 2020-05-01 NOTE — Telephone Encounter (Signed)
Requested Prescriptions  Pending Prescriptions Disp Refills  . pravastatin (PRAVACHOL) 40 MG tablet [Pharmacy Med Name: PRAVASTATIN 40MG  TABLETS] 90 tablet 3    Sig: TAKE 1 TABLET BY MOUTH EVERY DAY     Cardiovascular:  Antilipid - Statins Failed - 05/01/2020  6:33 AM      Failed - LDL in normal range and within 360 days    LDL Chol Calc (NIH)  Date Value Ref Range Status  01/28/2020 105 (H) 0 - 99 mg/dL Final         Failed - Triglycerides in normal range and within 360 days    Triglycerides  Date Value Ref Range Status  01/28/2020 178 (H) 0 - 149 mg/dL Final         Passed - Total Cholesterol in normal range and within 360 days    Cholesterol, Total  Date Value Ref Range Status  01/28/2020 177 100 - 199 mg/dL Final         Passed - HDL in normal range and within 360 days    HDL  Date Value Ref Range Status  01/28/2020 41 >39 mg/dL Final         Passed - Patient is not pregnant      Passed - Valid encounter within last 12 months    Recent Outpatient Visits          3 months ago Essential hypertension   [place], [name], [place]   1 year ago Essential hypertension   [facility], [name], [place]   1 year ago Chronic left shoulder pain   [facility], [name], [place]   2 years ago Annual physical exam   [facility], [name], [place]   2 years ago Essential hypertension   [facility], [name], [place]      Future Appointments            In 3 weeks Tamala Julian, Olevia Bowens, DO [place]   In 5 months [place], [facility]

## 2020-05-12 ENCOUNTER — Other Ambulatory Visit: Payer: Self-pay

## 2020-05-12 ENCOUNTER — Ambulatory Visit (INDEPENDENT_AMBULATORY_CARE_PROVIDER_SITE_OTHER): Payer: Medicare Other

## 2020-05-12 DIAGNOSIS — Z23 Encounter for immunization: Secondary | ICD-10-CM

## 2020-05-26 ENCOUNTER — Ambulatory Visit (INDEPENDENT_AMBULATORY_CARE_PROVIDER_SITE_OTHER): Payer: Medicare Other

## 2020-05-26 ENCOUNTER — Other Ambulatory Visit: Payer: Self-pay

## 2020-05-26 ENCOUNTER — Encounter: Payer: Self-pay | Admitting: Family Medicine

## 2020-05-26 ENCOUNTER — Ambulatory Visit: Payer: Self-pay

## 2020-05-26 ENCOUNTER — Ambulatory Visit: Payer: Medicare Other | Admitting: Family Medicine

## 2020-05-26 VITALS — BP 148/82 | HR 72 | Ht 69.0 in | Wt 206.0 lb

## 2020-05-26 DIAGNOSIS — M75102 Unspecified rotator cuff tear or rupture of left shoulder, not specified as traumatic: Secondary | ICD-10-CM | POA: Diagnosis not present

## 2020-05-26 DIAGNOSIS — M1A09X1 Idiopathic chronic gout, multiple sites, with tophus (tophi): Secondary | ICD-10-CM

## 2020-05-26 DIAGNOSIS — M19012 Primary osteoarthritis, left shoulder: Secondary | ICD-10-CM

## 2020-05-26 DIAGNOSIS — M12812 Other specific arthropathies, not elsewhere classified, left shoulder: Secondary | ICD-10-CM | POA: Insufficient documentation

## 2020-05-26 DIAGNOSIS — M25512 Pain in left shoulder: Secondary | ICD-10-CM

## 2020-05-26 DIAGNOSIS — G8929 Other chronic pain: Secondary | ICD-10-CM | POA: Diagnosis not present

## 2020-05-26 IMAGING — DX DG SHOULDER 2+V*L*
3 series · 3 of 3 positions shown · non-contrast
Comparison: None.

CLINICAL DATA: Left shoulder pain

EXAM:
LEFT SHOULDER - 2+ VIEW

[shoulder ap (1 of 2)]
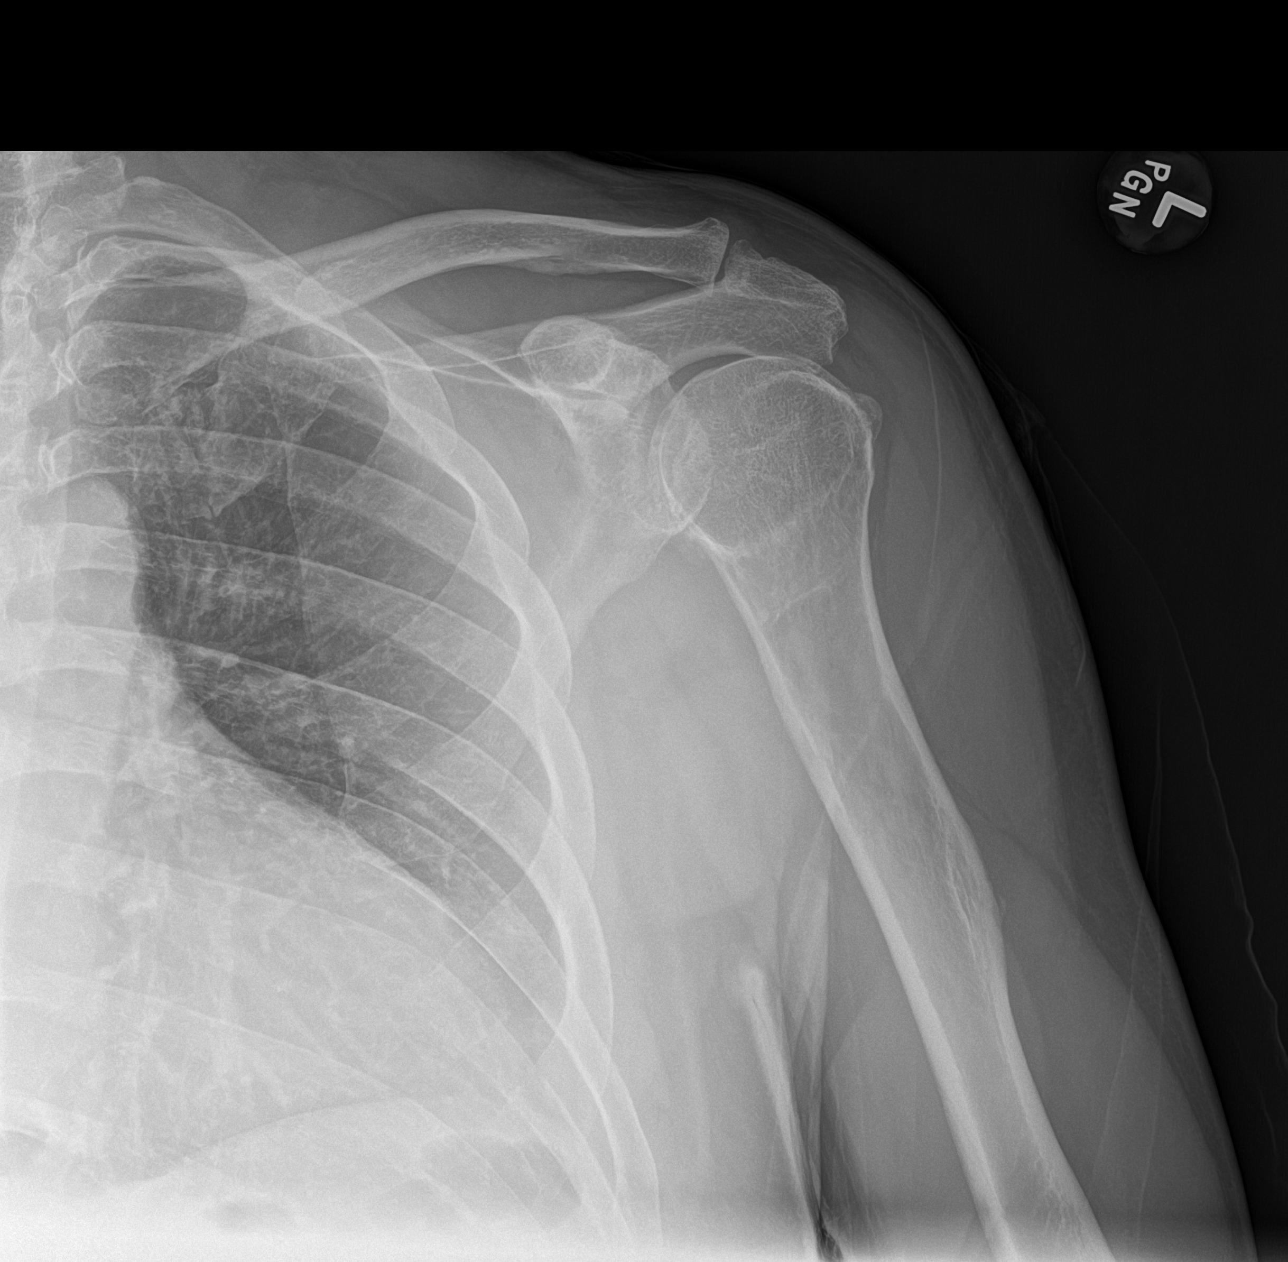

[shoulder ap (2 of 2)]
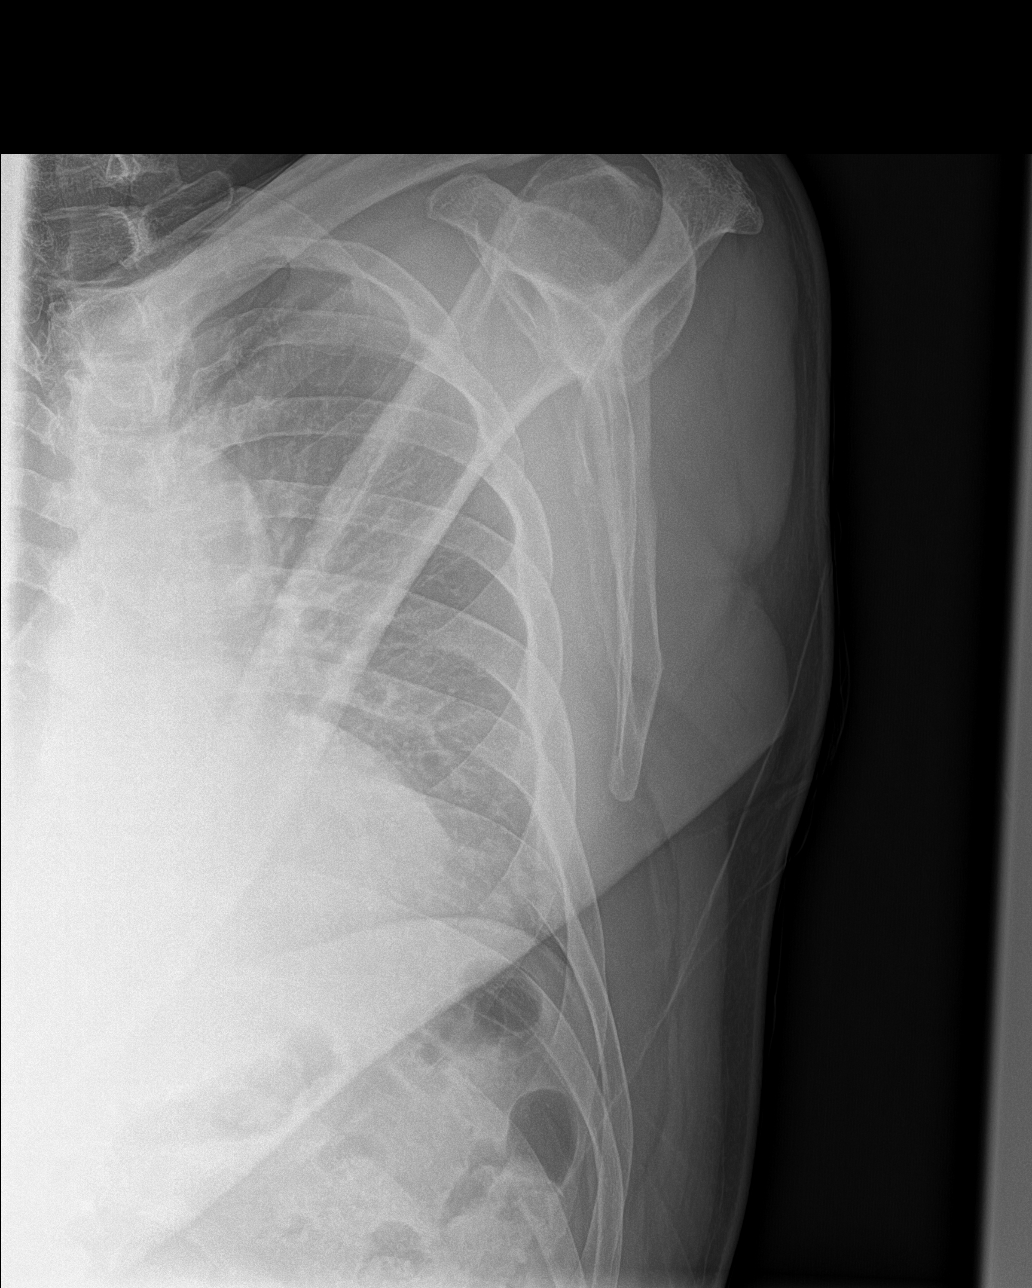

[shoulder axial]
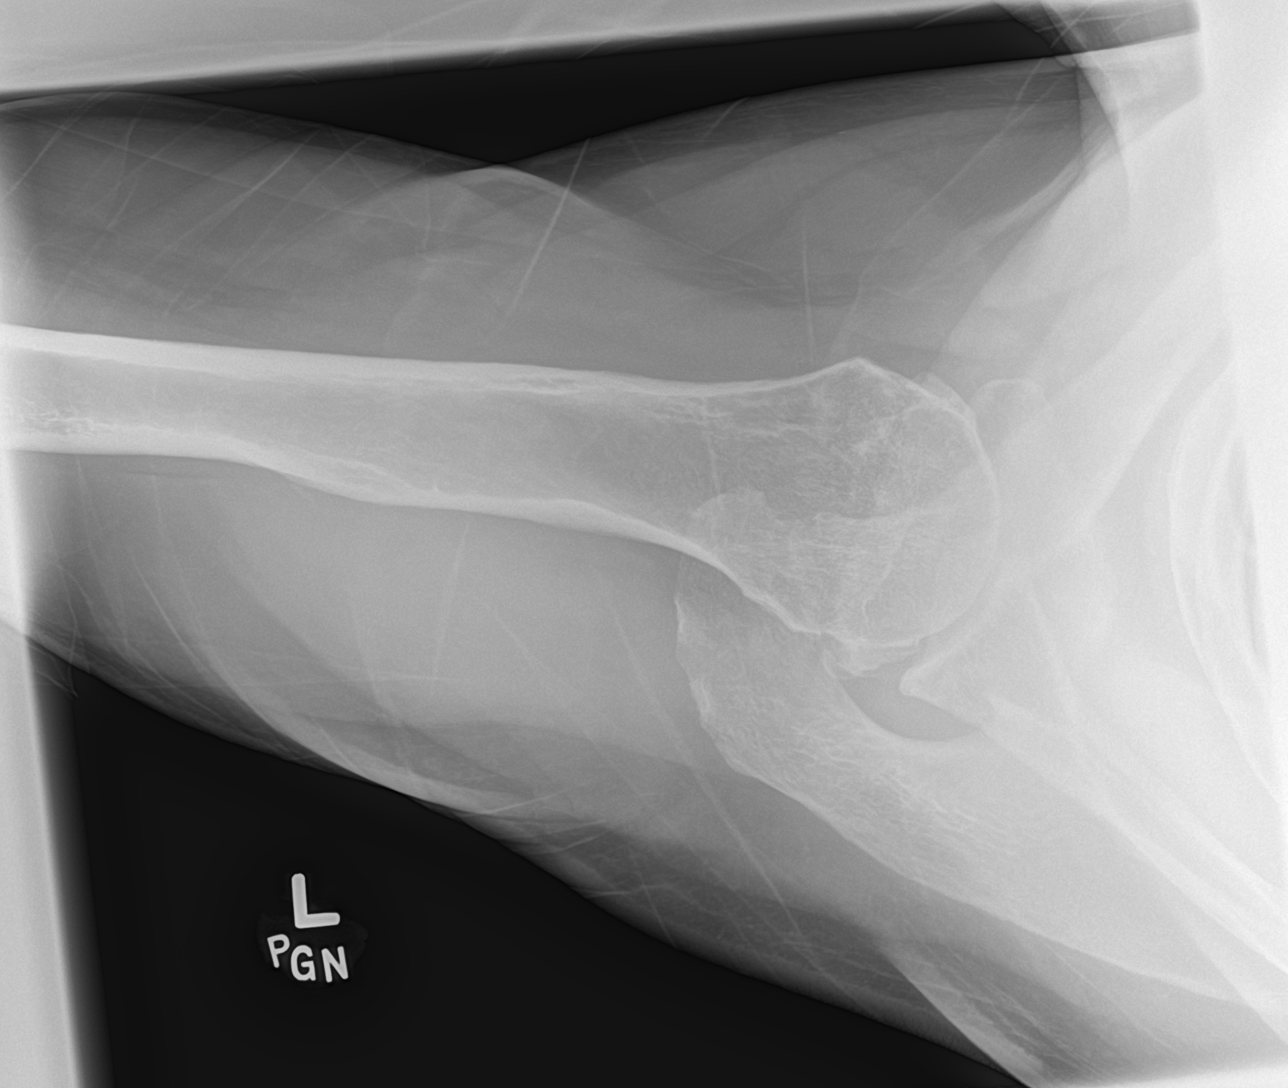

[3 of 3 positions shown; findings below may reference images not displayed]

FINDINGS: There is no acute displaced fracture or dislocation. There is
moderate left glenohumeral osteoarthritis. There is moderate left
acromioclavicular osteoarthritis.
IMPRESSION: Moderate left glenohumeral and acromioclavicular osteoarthritis. No
acute fracture or dislocation.

## 2020-05-26 NOTE — Assessment & Plan Note (Signed)
Significant improvement since being on allopurinol.  Was given colchicine for short course continue treatment with the over-the-counter medications as well.

## 2020-05-26 NOTE — Assessment & Plan Note (Addendum)
Likely.  Has been since 2019.  Had x-rays at that time showing likely arthritic changes.  Concerned that patient is getting rotator cuff arthropathy.  Doing well though with conservative therapy.  We discussed the potential of an MRI but patient states it would not change his management because he would not want any surgical intervention with patient making progress at this time.  Encouraged him to continue to be active.  Discussed icing regimen and home exercises.  Follow-up in 2 months

## 2020-05-26 NOTE — Assessment & Plan Note (Signed)
Reviewed operative ejection..  Discussed proper lifting mechanics.  X-rays are pending.  We will see how much progression.

## 2020-05-26 NOTE — Patient Instructions (Signed)
Keep watching shoulder Xray today Glad gout is better [facility] Continue recovery sandals See me in 2 months

## 2020-05-26 NOTE — Progress Notes (Signed)
Curtis Curtis [name] [place] Phone: [phone] Subjective:   Curtis Curtis [name], am serving as a scribe for Dr. Hulan [name]. This visit occurred during the SARS-CoV-2 public health emergency.  Safety protocols were in place, including screening questions prior to the visit, additional usage of staff PPE, and extensive cleaning of exam room while observing appropriate contact time as indicated for disinfecting solutions.   I'm seeing this patient by the request  of:  Curtis Curtis [name], Curtis Muff, PA  CC: Left shoulder pain follow-up  QAS:[id number]   04/05/2020 Patient seems to be doing better.  Patient does have some underlying glenohumeral arthritis that likely is contributing to some of the decrease in range of motion.  Encourage patient to continue to increase activity were tolerated by keeping hands within peripheral vision.  Patient should continue to make improvement and follow-up with me again in 4 to 8 weeks   Update 05/26/2020 Curtis Curtis [name] is a 69 y.o. male coming in with complaint of left shoulder pain. Patient states that he still has weakness but pain is improving. Pain is minor at end ranges.  Patient states that at this point with this as well as the better control with his gout he has been feeling much better overall.  Has been going to the gym at this point and is making some progress with the shoulder strength      Past Medical History:  Diagnosis Date  . Arthritis   . Gout   . HTN (hypertension)   . Hyperlipidemia   . Hypogonadism in male   . Low back pain   . Morton neuroma, left    Past Surgical History:  Procedure Laterality Date  . ACHILLES TENDON SURGERY Right 1995  . HERNIA REPAIR  2002   double  . Surgery on Left pinky in 2000     Social History   Socioeconomic History  . Marital status: Married    Spouse name: Not on file  . Number of children: 2  . Years of education: Not on file  . Highest  education level: Bachelor's degree (e.g., BA, AB, BS)  Occupational History  . Occupation: Camera operator    Comment: retired  Tobacco Use  . Smoking status: Never Smoker  . Smokeless tobacco: Former Systems developer    Types: Snuff  . Tobacco comment: quit snuff at age 7  Vaping Use  . Vaping Use: Never used  Substance and Sexual Activity  . Alcohol use: Yes    Alcohol/week: 2.0 - 4.0 standard drinks    Types: 2 - 4 Cans of beer per week  . Drug use: Curtis [name]  . Sexual activity: Not on file  Other Topics Concern  . Not on file  Social History Narrative  . Not on file   Social Determinants of Health   Financial Resource Strain: Low Risk   . Difficulty of Paying Living Expenses: Not hard at all  Food Insecurity: Curtis [name] Food Insecurity  . Worried About Charity fundraiser in the Last Year: Never true  . Ran Out of Food in the Last Year: Never true  Transportation Needs: Curtis [name] Transportation Needs  . Lack of Transportation (Medical): Curtis [name]  . Lack of Transportation (Non-Medical): Curtis [name]  Physical Activity: Sufficiently Active  . Days of Exercise per Week: 3 days  . Minutes of Exercise per Session: 60 min  Stress: Curtis [name] Stress Concern Present  . Feeling of Stress : Not at all  Social Connections: Moderately  Integrated  . Frequency of Communication with Friends and Family: More than three times a week  . Frequency of Social Gatherings with Friends and Family: More than three times a week  . Attends Religious Services: More than 4 times per year  . Active Member of Clubs or Organizations: Curtis [name]  . Attends Archivist Meetings: Never  . Marital Status: Married   Curtis [name] Known Allergies Family History  Problem Relation Age of Onset  . Hypertension Mother   . Arthritis Mother   . Vision loss Mother        due to age  . Cancer Father        in the liver    Current Outpatient Medications (Endocrine & Metabolic):  .  testosterone cypionate (DEPOTESTOSTERONE CYPIONATE) 200 MG/ML injection, Inject 0.5  cc every fourteen days  Current Outpatient Medications (Cardiovascular):  .  lisinopril-hydrochlorothiazide (ZESTORETIC) 20-25 MG tablet, Take 1 tablet by mouth daily. .  pravastatin (PRAVACHOL) 40 MG tablet, TAKE 1 TABLET BY MOUTH EVERY DAY .  sildenafil (REVATIO) 20 MG tablet, Take 1 tablet (20 mg total) by mouth as needed. Take 3-5 tablets as needed prior to intercouse   Current Outpatient Medications (Analgesics):  .  allopurinol (ZYLOPRIM) 100 MG tablet, Take 1 tablet (100 mg total) by mouth daily. [name] Kitchen  allopurinol (ZYLOPRIM) 100 MG tablet, TAKE 2 TABLETS(200 MG) BY MOUTH DAILY .  colchicine 0.6 MG tablet, Take 1 tablet (0.6 mg total) by mouth daily.   Current Outpatient Medications (Other):  .  Apoaequorin (PREVAGEN PO), Take 1 tablet by mouth daily. .  Hypodermic Needles-Disposable (SAFETY-GARD NEEDLE 18G) MISC, 0.5 mLs by Does not apply route every 14 (fourteen) days. .  Multiple Vitamin (MULTIVITAMIN) tablet, Take 1 tablet by mouth daily. [name] Kitchen  NEEDLE, DISP, 21 G (BD SAFETYGLIDE SHIELDED NEEDLE) 21G X 1-1/2" MISC, 0.5 mLs by Does not apply route every 14 (fourteen) days. .  NON FORMULARY, Gout Relief 1/2 oz daily .  Syringe, Disposable, 3 ML MISC, 0.5 mLs by Does not apply route every 14 (fourteen) days. [name] Kitchen  PINE BARK, PYCNOGENOL, PO, Take 100 mg by mouth in the morning and at bedtime.   Reviewed prior external information including notes and imaging from  primary care provider As well as notes that were available from care everywhere and other healthcare systems.  Past medical history, social, surgical and family history all reviewed in electronic medical record.  Curtis [name] pertanent information unless stated regarding to the chief complaint.   Review of Systems:  Curtis [name] headache, visual changes, nausea, vomiting, diarrhea, constipation, dizziness, abdominal pain, skin rash, fevers, chills, night sweats, weight loss, swollen lymph nodes, body aches, joint swelling, chest pain, shortness of  breath, mood changes. POSITIVE muscle aches  Objective  Blood pressure (!) 148/82, pulse 72, height 5\' 9"  (1.753 m), weight 206 lb (93.4 kg), SpO2 97 %.   General: Curtis [name] apparent distress alert and oriented x3 mood and affect normal, dressed appropriately.  HEENT: Pupils equal, extraocular movements intact  Respiratory: Patient's speak in full sentences and does not appear short of breath  Cardiovascular: Curtis [name] lower extremity edema, non tender, Curtis [name] erythema  MSK: Left shoulder exam still has some decrease in external and internal range of motion.  Patient does have some mild pain of the acromioclavicular joint.  Rotator cuff strength 4+ out of 5.   Limited musculoskeletal ultrasound was performed and interpreted by Lyndal Pulley  Limited ultrasound shows the patient has significant decrease in hypoechoic changes within the  glenohumeral joint as well as in the acromioclavicular joint.  Patient does still have what appears to be a left rotator cuff tear noted of the supraspinatus.  At one level does measure approximately 1 cm of retraction.  This is minorly increased but still seems to be a partial-thickness tear Impression: Interval improvement in swelling of the shoulder joints Impression and Recommendations:     The above documentation has been reviewed and is accurate and complete Lyndal Pulley, DO

## 2020-06-01 ENCOUNTER — Ambulatory Visit
Admission: EM | Admit: 2020-06-01 | Discharge: 2020-06-01 | Disposition: A | Discharge status: Home / Self Care | Payer: Medicare Other | Attending: Family Medicine | Admitting: Family Medicine

## 2020-06-01 DIAGNOSIS — W57XXXA Bitten or stung by nonvenomous insect and other nonvenomous arthropods, initial encounter: Secondary | ICD-10-CM

## 2020-06-01 DIAGNOSIS — L03115 Cellulitis of right lower limb: Secondary | ICD-10-CM | POA: Diagnosis not present

## 2020-06-01 MED ORDER — DOXYCYCLINE HYCLATE 100 MG PO CAPS: 100.0000 mg | ORAL_CAPSULE | Freq: Two times a day (BID) | ORAL | 0 refills | Status: DC

## 2020-06-01 NOTE — ED Triage Notes (Signed)
Pt reports having insect bite to R upper thigh. There is some swelling and pus in the area. Pt sts he thinks its a spider bite.

## 2020-06-01 NOTE — ED Provider Notes (Signed)
[facility]   [id number] 06/01/20 Arrival Time: 58  CC: RASH  SUBJECTIVE:  Curtis [name] is a 69 y.o. male who presents with a skin complaint that began 2 days ago. Reports possible bug bite to R inner thigh. Reports that he is outdoors often. Denies changes in soaps, detergents, close contacts with similar rash, known trigger or environmental trigger, allergy. Denies medications change or starting a new medication recently. Describes as almost purple, painful and warm to touch. Has not attempted OTC treatment.  There are no aggravating or alleviating factors. Denies similar symptoms in the past. Denies fever, chills, nausea, vomiting, erythema, swelling, discharge, oral lesions, SOB, chest pain, abdominal pain, changes in bowel or bladder function.    ROS: As per HPI.  All other pertinent ROS negative.     Past Medical History:  Diagnosis Date  . Arthritis   . Gout   . HTN (hypertension)   . Hyperlipidemia   . Hypogonadism in male   . Low back pain   . Morton neuroma, left    Past Surgical History:  Procedure Laterality Date  . ACHILLES TENDON SURGERY Right 1995  . HERNIA REPAIR  2002   double  . Surgery on Left pinky in 2000     No Known Allergies No current facility-administered medications on file prior to encounter.   Current Outpatient Medications on File Prior to Encounter  Medication Sig Dispense Refill  . allopurinol (ZYLOPRIM) 100 MG tablet Take 1 tablet (100 mg total) by mouth daily. 90 tablet 3  . allopurinol (ZYLOPRIM) 100 MG tablet TAKE 2 TABLETS(200 MG) BY MOUTH DAILY 180 tablet 0  . Apoaequorin (PREVAGEN PO) Take 1 tablet by mouth daily.    . colchicine 0.6 MG tablet Take 1 tablet (0.6 mg total) by mouth daily. 30 tablet 0  . Hypodermic Needles-Disposable (SAFETY-GARD NEEDLE 18G) MISC 0.5 mLs by Does not apply route every 14 (fourteen) days. 30 each 5  . lisinopril-hydrochlorothiazide (ZESTORETIC) 20-25 MG tablet Take 1 tablet by mouth  daily. 90 tablet 3  . Multiple Vitamin (MULTIVITAMIN) tablet Take 1 tablet by mouth daily.    [name] Kitchen NEEDLE, DISP, 21 G (BD SAFETYGLIDE SHIELDED NEEDLE) 21G X 1-1/2" MISC 0.5 mLs by Does not apply route every 14 (fourteen) days. 30 each 5  . NON FORMULARY Gout Relief 1/2 oz daily    . PINE BARK, PYCNOGENOL, PO Take 100 mg by mouth in the morning and at bedtime.    . pravastatin (PRAVACHOL) 40 MG tablet TAKE 1 TABLET BY MOUTH EVERY DAY 90 tablet 2  . sildenafil (REVATIO) 20 MG tablet Take 1 tablet (20 mg total) by mouth as needed. Take 3-5 tablets as needed prior to intercouse 30 tablet 11  . Syringe, Disposable, 3 ML MISC 0.5 mLs by Does not apply route every 14 (fourteen) days. 30 each 5  . testosterone cypionate (DEPOTESTOSTERONE CYPIONATE) 200 MG/ML injection Inject 0.5 cc every fourteen days 10 mL 0   Social History   Socioeconomic History  . Marital status: Married    Spouse name: Not on file  . Number of children: 2  . Years of education: Not on file  . Highest education level: Bachelor's degree (e.g., BA, AB, BS)  Occupational History  . Occupation: Camera operator    Comment: retired  Tobacco Use  . Smoking status: Never Smoker  . Smokeless tobacco: Former Systems developer    Types: Snuff  . Tobacco comment: quit snuff at age 34  Vaping Use  .  Vaping Use: Never used  Substance and Sexual Activity  . Alcohol use: Yes    Alcohol/week: 2.0 - 4.0 standard drinks    Types: 2 - 4 Cans of beer per week  . Drug use: No  . Sexual activity: Not on file  Other Topics Concern  . Not on file  Social History Narrative  . Not on file   Social Determinants of Health   Financial Resource Strain: Low Risk   . Difficulty of Paying Living Expenses: Not hard at all  Food Insecurity: No Food Insecurity  . Worried About Charity fundraiser in the Last Year: Never true  . Ran Out of Food in the Last Year: Never true  Transportation Needs: No Transportation Needs  . Lack of Transportation (Medical):  No  . Lack of Transportation (Non-Medical): No  Physical Activity: Sufficiently Active  . Days of Exercise per Week: 3 days  . Minutes of Exercise per Session: 60 min  Stress: No Stress Concern Present  . Feeling of Stress : Not at all  Social Connections: Moderately Integrated  . Frequency of Communication with Friends and Family: More than three times a week  . Frequency of Social Gatherings with Friends and Family: More than three times a week  . Attends Religious Services: More than 4 times per year  . Active Member of Clubs or Organizations: No  . Attends Archivist Meetings: Never  . Marital Status: Married  Human resources officer Violence: Not At Risk  . Fear of Current or Ex-Partner: No  . Emotionally Abused: No  . Physically Abused: No  . Sexually Abused: No   Family History  Problem Relation Age of Onset  . Hypertension Mother   . Arthritis Mother   . Vision loss Mother        due to age  . Cancer Father        in the liver    OBJECTIVE: Vitals:   06/01/20 1420 06/01/20 1421  BP: (!) 190/65   Pulse: 76   Resp: 16   Temp: 98.6 F (37 C)   TempSrc: Oral   SpO2: 98%   Weight:  195 lb (88.5 kg)  Height:  5\' 9"  (1.753 m)    General appearance: alert; no distress Head: NCAT Lungs: clear to auscultation bilaterally Heart: regular rate and rhythm.  Radial pulse 2+ bilaterally Extremities: no edema Skin: warm and dry; about 1 cm lesion to R innder thigh erythematous, warm, tender to touch with central lesion Psychological: alert and cooperative; normal mood and affect  ASSESSMENT & PLAN:  1. Cellulitis of right lower extremity   2. Bug bite, initial encounter     Meds ordered this encounter  Medications  . doxycycline (VIBRAMYCIN) 100 MG capsule    Sig: Take 1 capsule (100 mg total) by mouth 2 (two) times daily.    Dispense:  14 capsule    Refill:  0    Order Specific Question:   Supervising Provider    Answer:   Chase Picket [id number]     Prescribed doxycycline Take as prescribed and to completion Avoid hot showers/ baths Moisturize skin daily  Follow up with PCP if symptoms persists Return or go to the ER if you have any new or worsening symptoms such as fever, chills, nausea, vomiting, redness, swelling, discharge, if symptoms do not improve with medications  Reviewed expectations re: course of current medical issues. Questions answered. Outlined signs and symptoms indicating need for more acute intervention.  Patient verbalized understanding. After Visit Summary given.   Faustino Congress, NP 06/01/20 1436

## 2020-06-01 NOTE — Discharge Instructions (Addendum)
I have sent in doxycycline for you to take twice a day for 7 days  Follow up with this office or with primary care if symptoms are persisting.  Follow up in the ER for high fever, trouble swallowing, trouble breathing, other concerning symptoms.

## 2020-06-21 ENCOUNTER — Other Ambulatory Visit: Payer: Self-pay | Admitting: Family Medicine

## 2020-06-21 ENCOUNTER — Encounter: Payer: Self-pay | Admitting: Family Medicine

## 2020-07-26 NOTE — Progress Notes (Signed)
[name] [address] [place] [address] Phone: [phone] Subjective:   I Curtis [name] am serving as a Neurosurgeon for Dr. Antoine Primas.  This visit occurred during the SARS-CoV-2 public health emergency.  Safety protocols were in place, including screening questions prior to the visit, additional usage of staff PPE, and extensive cleaning of exam room while observing appropriate contact time as indicated for disinfecting solutions.   I'm seeing this patient by the request  of:  Chrismon, Jodell Cipro, PA-C  CC: Shoulder pain follow-up  SFK:[id number]   05/26/2020 Likely.  Has been since 2019.  Had x-rays at that time showing likely arthritic changes.  Concerned that patient is getting rotator cuff arthropathy.  Doing well though with conservative therapy.  We discussed the potential of an MRI but patient states it would not change his management because he would not want any surgical intervention with patient making progress at this time.  Encouraged him to continue to be active.  Discussed icing regimen and home exercises.  Follow-up in 2 months    Update 07/26/2020 Curtis [name] is a 69 y.o. male coming in with complaint of left RTC. States he is feeling about the same and not making any progress. Not getting any worse.  Patient states that most daily activities he does not even notice it.  If he does too much pulling or overhead activity starts giving him discomfort.  Patient went hunting had no problems until he tried to drag a deer.    Patient's most previous x-rays taken May 27, 2020 which were independently visualized by me today show moderate left glenohumeral and acromioclavicular arthritis  Past Medical History:  Diagnosis Date  . Arthritis   . Gout   . HTN (hypertension)   . Hyperlipidemia   . Hypogonadism in male   . Low back pain   . Morton neuroma, left    Past Surgical History:  Procedure Laterality Date  . ACHILLES  TENDON SURGERY Right 1995  . HERNIA REPAIR  2002   double  . Surgery on Left pinky in 2000     Social History   Socioeconomic History  . Marital status: Married    Spouse name: Not on file  . Number of children: 2  . Years of education: Not on file  . Highest education level: Bachelor's degree (e.g., BA, AB, BS)  Occupational History  . Occupation: Development worker, international aid    Comment: retired  Tobacco Use  . Smoking status: Never Smoker  . Smokeless tobacco: Former Neurosurgeon    Types: Snuff  . Tobacco comment: quit snuff at age 60  Vaping Use  . Vaping Use: Never used  Substance and Sexual Activity  . Alcohol use: Yes    Alcohol/week: 2.0 - 4.0 standard drinks    Types: 2 - 4 Cans of beer per week  . Drug use: No  . Sexual activity: Not on file  Other Topics Concern  . Not on file  Social History Narrative  . Not on file   Social Determinants of Health   Financial Resource Strain: Low Risk   . Difficulty of Paying Living Expenses: Not hard at all  Food Insecurity: No Food Insecurity  . Worried About Programme researcher, broadcasting/film/video in the Last Year: Never true  . Ran Out of Food in the Last Year: Never true  Transportation Needs: No Transportation Needs  . Lack of Transportation (Medical): No  . Lack of Transportation (Non-Medical): No  Physical Activity: Sufficiently Active  . Days of Exercise per Week: 3 days  . Minutes of Exercise per Session: 60 min  Stress: No Stress Concern Present  . Feeling of Stress : Not at all  Social Connections: Moderately Integrated  . Frequency of Communication with Friends and Family: More than three times a week  . Frequency of Social Gatherings with Friends and Family: More than three times a week  . Attends Religious Services: More than 4 times per year  . Active Member of Clubs or Organizations: No  . Attends Banker Meetings: Never  . Marital Status: Married   No Known Allergies Family History  Problem Relation Age of Onset  .  Hypertension Mother   . Arthritis Mother   . Vision loss Mother        due to age  . Cancer Father        in the liver    Current Outpatient Medications (Endocrine & Metabolic):  .  testosterone cypionate (DEPOTESTOSTERONE CYPIONATE) 200 MG/ML injection, Inject 0.5 cc every fourteen days  Current Outpatient Medications (Cardiovascular):  .  lisinopril-hydrochlorothiazide (ZESTORETIC) 20-25 MG tablet, Take 1 tablet by mouth daily. .  pravastatin (PRAVACHOL) 40 MG tablet, TAKE 1 TABLET BY MOUTH EVERY DAY .  sildenafil (REVATIO) 20 MG tablet, Take 1 tablet (20 mg total) by mouth as needed. Take 3-5 tablets as needed prior to intercouse   Current Outpatient Medications (Analgesics):  .  allopurinol (ZYLOPRIM) 100 MG tablet, TAKE 2 TABLETS(200 MG) BY MOUTH DAILY   Current Outpatient Medications (Other):  .  Apoaequorin (PREVAGEN PO), Take 1 tablet by mouth daily. [name] Kitchen  doxycycline (VIBRAMYCIN) 100 MG capsule, Take 1 capsule (100 mg total) by mouth 2 (two) times daily. .  Hypodermic Needles-Disposable (SAFETY-GARD NEEDLE 18G) MISC, 0.5 mLs by Does not apply route every 14 (fourteen) days. .  Multiple Vitamin (MULTIVITAMIN) tablet, Take 1 tablet by mouth daily. [name] Kitchen  NEEDLE, DISP, 21 G (BD SAFETYGLIDE SHIELDED NEEDLE) 21G X 1-1/2" MISC, 0.5 mLs by Does not apply route every 14 (fourteen) days. .  NON FORMULARY, Gout Relief 1/2 oz daily .  Syringe, Disposable, 3 ML MISC, 0.5 mLs by Does not apply route every 14 (fourteen) days.   Reviewed prior external information including notes and imaging from  primary care provider As well as notes that were available from care everywhere and other healthcare systems.  Past medical history, social, surgical and family history all reviewed in electronic medical record.  No pertanent information unless stated regarding to the chief complaint.   Review of Systems:  No headache, visual changes, nausea, vomiting, diarrhea, constipation, dizziness, abdominal  pain, skin rash, fevers, chills, night sweats, weight loss, swollen lymph nodes, body aches, joint swelling, chest pain, shortness of breath, mood changes. POSITIVE muscle aches  Objective  Blood pressure (!) 160/90, pulse 66, height 5\' 9"  (1.753 m), weight 203 lb (92.1 kg), SpO2 96 %.   General: No apparent distress alert and oriented x3 mood and affect normal, dressed appropriately.  HEENT: Pupils equal, extraocular movements intact  Respiratory: Patient's speak in full sentences and does not appear short of breath  Cardiovascular: No lower extremity edema, non tender, no erythema  Left shoulder exam mild atrophy of the musculature compared to the contralateral side.  Mild crepitus with range of motion.  4+ out of 5 strength compared to 5 out of 5 on the contralateral side.  Mild limited range of motion in external and internal range of motion  also noted.  Tenderness over the glenohumeral joint   Impression and Recommendations:     The above documentation has been reviewed and is accurate and complete Lyndal Pulley, DO

## 2020-07-27 ENCOUNTER — Other Ambulatory Visit: Payer: Self-pay

## 2020-07-27 ENCOUNTER — Ambulatory Visit: Payer: Medicare Other | Admitting: Family Medicine

## 2020-07-27 ENCOUNTER — Encounter: Payer: Self-pay | Admitting: Family Medicine

## 2020-07-27 DIAGNOSIS — M75102 Unspecified rotator cuff tear or rupture of left shoulder, not specified as traumatic: Secondary | ICD-10-CM

## 2020-07-27 NOTE — Patient Instructions (Signed)
Glad you are doing decent You know we have the option of injection and MRI Happy New Year See me again 4 months just in case

## 2020-07-27 NOTE — Assessment & Plan Note (Signed)
Patient has a rotator cuff tear and does now have some moderate glenohumeral and acromioclavicular arthritis.  Discussed with patient about different treatment options.  We discussed again about an MRI which patient declined.  Patient states that at this point he would not want any surgical intervention and feels like he does relatively well unless he does a lot of heavy lifting.  He does notice that the shoulder fatigues mild discomfort at night but otherwise can do all daily activities.  We discussed continuing the home exercises.  If worsening symptoms we can consider injections.  Patient understands this and will follow up with me again 4 months

## 2020-09-04 ENCOUNTER — Encounter: Payer: Self-pay | Admitting: Family Medicine

## 2020-09-04 ENCOUNTER — Other Ambulatory Visit: Payer: Self-pay

## 2020-09-04 DIAGNOSIS — E349 Endocrine disorder, unspecified: Secondary | ICD-10-CM

## 2020-09-05 MED ORDER — TESTOSTERONE CYPIONATE 200 MG/ML IM SOLN: INTRAMUSCULAR | 0 refills | Status: DC

## 2020-09-13 ENCOUNTER — Telehealth: Payer: Self-pay | Admitting: Urology

## 2020-09-13 NOTE — Telephone Encounter (Signed)
PA has been filled out waiting on response.

## 2020-09-13 NOTE — Telephone Encounter (Signed)
Pt's pharmacy, [facility] on [address] we need a prior auth to get testosterone refilled.  [phone]  He has 1 more injection.

## 2020-09-14 NOTE — Telephone Encounter (Signed)
Patient notified Testosterone PA has been approved.  Effective from 09/13/2020 through 09/13/2021.

## 2020-09-23 ENCOUNTER — Other Ambulatory Visit: Payer: Self-pay | Admitting: Family Medicine

## 2020-09-26 ENCOUNTER — Other Ambulatory Visit: Payer: Self-pay | Admitting: Family Medicine

## 2020-10-04 ENCOUNTER — Other Ambulatory Visit: Payer: Medicare Other

## 2020-10-05 ENCOUNTER — Other Ambulatory Visit: Payer: Self-pay

## 2020-10-05 DIAGNOSIS — E349 Endocrine disorder, unspecified: Secondary | ICD-10-CM

## 2020-10-07 ENCOUNTER — Ambulatory Visit: Payer: Medicare Other | Admitting: Urology

## 2020-10-10 ENCOUNTER — Other Ambulatory Visit: Payer: Medicare Other

## 2020-10-10 ENCOUNTER — Other Ambulatory Visit: Payer: Self-pay

## 2020-10-10 DIAGNOSIS — E349 Endocrine disorder, unspecified: Secondary | ICD-10-CM

## 2020-10-11 LAB — TESTOSTERONE: Testosterone: 653 ng/dL (ref 264–916)

## 2020-10-11 LAB — HEMOGLOBIN AND HEMATOCRIT, BLOOD
Hematocrit: 45.3 % (ref 37.5–51.0)
Hemoglobin: 15.3 g/dL (ref 13.0–17.7)

## 2020-10-11 LAB — PSA: Prostate Specific Ag, Serum: 2 ng/mL (ref 0.0–4.0)

## 2020-10-11 NOTE — Progress Notes (Signed)
10/12/2020 8:54 AM   Curtis [name] [birth year] [id number]  Referring provider: [name], PA-C [address] [place],  [place] [address]  Chief Complaint  Patient presents with  . testosterone deficiency  . Erectile Dysfunction  . Benign Prostatic Hypertrophy   Urological history: 1. Testosterone deficiency - managed with testosterone cypionate 200 mg/cc, 0.5 cc every 14 days   2. BPH with LU TS - PSA 2.0 in 09/2020 - I PSS 4/1  3. ED - SHIM 21 - contributing factors of age, BPH, testosterone deficiency, HTN and HLD - managed with Levitra - could not tolerate sildenafil or tadalafil  HPI: Curtis [name] is a 70 y.o. male who presents today for 6 month follow up.   He has no urinary complaints at this time.    Patient denies any modifying or aggravating factors.  Patient denies any gross hematuria, dysuria or suprapubic/flank pain.  Patient denies any fevers, chills, nausea or vomiting.   UA benign.      IPSS    Row Name 10/12/20 0800         International Prostate Symptom Score   How often have you had the sensation of not emptying your bladder? Not at All     How often have you had to urinate less than every two hours? Less than 1 in 5 times     How often have you found you stopped and started again several times when you urinated? Less than 1 in 5 times     How often have you found it difficult to postpone urination? Not at All     How often have you had a weak urinary stream? Less than 1 in 5 times     How often have you had to strain to start urination? Not at All     How many times did you typically get up at night to urinate? 1 Time     Total IPSS Score 4           Quality of Life due to urinary symptoms   If you were to spend the rest of your life with your urinary condition just the way it is now how would you feel about that? Pleased            Score:  1-7 Mild 8-19 Moderate 20-35 Severe  Patient still having spontaneous  erections.  He denies any pain or curvature with erections.  At goal with PDE5i's.     SHIM    Row Name 10/12/20 0830         SHIM: Over the last 6 months:   How do you rate your confidence that you could get and keep an erection? Moderate     When you had erections with sexual stimulation, how often were your erections hard enough for penetration (entering your partner)? Most Times (much more than half the time)     During sexual intercourse, how often were you able to maintain your erection after you had penetrated (entered) your partner? Almost Always or Always     During sexual intercourse, how difficult was it to maintain your erection to completion of intercourse? Slightly Difficult     When you attempted sexual intercourse, how often was it satisfactory for you? Almost Always or Always           SHIM Total Score   SHIM 21            Score: 1-7 Severe ED 8-11 Moderate ED 12-16  Mild-Moderate ED 17-21 Mild ED 22-25 No ED    PMH: Past Medical History:  Diagnosis Date  . Arthritis   . Gout   . HTN (hypertension)   . Hyperlipidemia   . Hypogonadism in male   . Low back pain   . Morton neuroma, left    Surgical History: Past Surgical History:  Procedure Laterality Date  . ACHILLES TENDON SURGERY Right 1995  . HERNIA REPAIR  2002   double  . Surgery on Left pinky in 2000     Home Medications:  Allergies as of 10/12/2020   No Known Allergies     Medication List       Accurate as of October 12, 2020  8:54 AM. If you have any questions, ask your nurse or doctor.        STOP taking these medications   doxycycline 100 MG capsule Commonly known as: VIBRAMYCIN Stopped by: Derik Fults, PA-C     TAKE these medications   allopurinol 100 MG tablet Commonly known as: ZYLOPRIM TAKE 2 TABLETS(200 MG) BY MOUTH DAILY   BD SafetyGlide Shielded Needle 21G X 1-1/2" Misc Generic drug: NEEDLE (DISP) 21 G 0.5 mLs by Does not apply route every 14 (fourteen) days.    lisinopril-hydrochlorothiazide 20-25 MG tablet Commonly known as: ZESTORETIC Take 1 tablet by mouth daily.   multivitamin tablet Take 1 tablet by mouth daily.   NON FORMULARY Gout Relief 1/2 oz daily   pravastatin 40 MG tablet Commonly known as: PRAVACHOL TAKE 1 TABLET BY MOUTH EVERY DAY   PREVAGEN PO Take 1 tablet by mouth daily.   SAFETY-GARD Needle 18G Misc Generic drug: Hypodermic Needles-Disposable 0.5 mLs by Does not apply route every 14 (fourteen) days.   sildenafil 20 MG tablet Commonly known as: REVATIO Take 1 tablet (20 mg total) by mouth as needed. Take 3-5 tablets as needed prior to intercouse   Syringe (Disposable) 3 ML Misc 0.5 mLs by Does not apply route every 14 (fourteen) days.   testosterone cypionate 200 MG/ML injection Commonly known as: DEPOTESTOSTERONE CYPIONATE Inject 0.5 cc every fourteen days      Allergies: No Known Allergies  Family History: Family History  Problem Relation Age of Onset  . Hypertension Mother   . Arthritis Mother   . Vision loss Mother        due to age  . Cancer Father        in the liver   Social History:  reports that he has never smoked. He has quit using smokeless tobacco.  His smokeless tobacco use included snuff. He reports current alcohol use of about 2.0 - 4.0 standard drinks of alcohol per week. He reports that he does not use drugs.  For pertinent review of systems please refer to history of present illness  Physical Exam: BP (!) 203/69   Pulse 77   Ht 5\' 9"  (1.753 m)   Wt 197 lb (89.4 kg)   BMI 29.09 kg/m   Constitutional:  Well nourished. Alert and oriented, No acute distress. HEENT:  AT, mask in place.  Trachea midline Cardiovascular: No clubbing, cyanosis, or edema. Respiratory: Normal respiratory effort, no increased work of breathing. GU: No CVA tenderness.  No bladder fullness or masses.  Patient with circumcised phallus.  Urethral meatus is patent.  No penile discharge. No penile lesions or  rashes. Scrotum without lesions, cysts, rashes and/or edema.  Testicles are located scrotally bilaterally. No masses are appreciated in the testicles. Left and right epididymis are  normal. Rectal: Patient with  normal sphincter tone. Anus and perineum without scarring or rashes. No rectal masses are appreciated. Prostate is approximately 50 grams, could only palpate the apex in the midportion of the gland, no nodules are appreciated. Seminal vesicles could not be palpated Lymph: No inguinal adenopathy. Neurologic: Grossly intact, no focal deficits, moving all 4 extremities. Psychiatric: Normal mood and affect.   Laboratory Data: Component     Latest Ref Rng & Units 11/11/2015 01/01/2017 10/28/2017 11/12/2017  Prostate Specific Ag, Serum     0.0 - 4.0 ng/mL 1.6 1.4 2.4 1.7   Component     Latest Ref Rng & Units 02/13/2018 06/24/2018 02/16/2019 08/17/2019  Prostate Specific Ag, Serum     0.0 - 4.0 ng/mL 1.6 1.6 2.0 1.8   Component     Latest Ref Rng & Units 11/26/2019 03/29/2020 10/10/2020  Prostate Specific Ag, Serum     0.0 - 4.0 ng/mL 1.8 2.2 2.0    Lab Results  Component Value Date   WBC 4.9 01/28/2020   HGB 15.3 10/10/2020   HCT 45.3 10/10/2020   MCV 87 01/28/2020   PLT 151 01/28/2020   Lab Results  Component Value Date   CREATININE 0.95 01/28/2020   Lab Results  Component Value Date   TESTOSTERONE 653 10/10/2020   Lab Results  Component Value Date   TSH 1.210 01/28/2020      Component Value Date/Time   CHOL 177 01/28/2020 1037   HDL 41 01/28/2020 1037   CHOLHDL 4.3 01/28/2020 1037   LDLCALC 105 (H) 01/28/2020 1037   Lab Results  Component Value Date   AST 26 01/28/2020   Lab Results  Component Value Date   ALT 24 01/28/2020   Urinalysis Component     Latest Ref Rng & Units 10/12/2020  Specific Gravity, UA     1.005 - 1.030 1.025  pH, UA     5.0 - 7.5 5.5  Color, UA     Yellow Yellow  Appearance Ur     Clear Clear  Leukocytes,UA     Negative Negative   Protein,UA     Negative/Trace Negative  Glucose, UA     Negative Negative  Ketones, UA     Negative Trace (A)  RBC, UA     Negative Negative  Bilirubin, UA     Negative Negative  Urobilinogen, Ur     0.2 - 1.0 mg/dL 0.2  Nitrite, UA     Negative Negative  Microscopic Examination      See below:   Component     Latest Ref Rng & Units 10/12/2020  WBC, UA     0 - 5 /hpf None seen  RBC     0 - 2 /hpf None seen  Epithelial Cells (non renal)     0 - 10 /hpf 0-10  Bacteria, UA     None seen/Few None seen   I have independently reviewed the labs.  Assessment & Plan:    1. Testosterone deficiency  - testosterone level is at therapeutic levels - He will continue his testosterone cypionate 200 mg/cc, 0.5 mg every 14 days  2. BPH with LUTS - IPSS score is improved  3. Erectile dysfunction:    - SHIM score is improved -Continue PDE 5 inhibitors  Return in about 6 months (around 04/14/2021) for PSA, testosterone (one week after injection) H & H only .  Laneta Simmers  [facility] [phone]  [place], [place] [address] [phone]

## 2020-10-12 ENCOUNTER — Other Ambulatory Visit: Payer: Self-pay

## 2020-10-12 ENCOUNTER — Encounter: Payer: Self-pay | Admitting: Urology

## 2020-10-12 ENCOUNTER — Ambulatory Visit: Payer: Medicare Other | Admitting: Urology

## 2020-10-12 VITALS — BP 203/69 | HR 77 | Ht 69.0 in | Wt 197.0 lb

## 2020-10-12 DIAGNOSIS — N529 Male erectile dysfunction, unspecified: Secondary | ICD-10-CM | POA: Diagnosis not present

## 2020-10-12 DIAGNOSIS — N138 Other obstructive and reflux uropathy: Secondary | ICD-10-CM | POA: Diagnosis not present

## 2020-10-12 DIAGNOSIS — E349 Endocrine disorder, unspecified: Secondary | ICD-10-CM

## 2020-10-12 DIAGNOSIS — N401 Enlarged prostate with lower urinary tract symptoms: Secondary | ICD-10-CM

## 2020-10-12 LAB — URINALYSIS, COMPLETE
Bilirubin, UA: NEGATIVE
Glucose, UA: NEGATIVE
Leukocytes,UA: NEGATIVE
Nitrite, UA: NEGATIVE
Protein,UA: NEGATIVE
RBC, UA: NEGATIVE
Specific Gravity, UA: 1.025 (ref 1.005–1.030)
Urobilinogen, Ur: 0.2 mg/dL (ref 0.2–1.0)
pH, UA: 5.5 (ref 5.0–7.5)

## 2020-10-12 LAB — MICROSCOPIC EXAMINATION
Bacteria, UA: NONE SEEN
RBC, Urine: NONE SEEN /hpf (ref 0–2)
WBC, UA: NONE SEEN /hpf (ref 0–5)

## 2020-10-28 ENCOUNTER — Encounter: Payer: Self-pay | Admitting: Family Medicine

## 2020-10-31 ENCOUNTER — Telehealth: Payer: Self-pay | Admitting: Family Medicine

## 2020-10-31 NOTE — Telephone Encounter (Signed)
Contacted pt to clarify message; he says his symptoms have resolved and he does not need and appt; will route to office for notification.

## 2020-10-31 NOTE — Telephone Encounter (Signed)
Maybe he needs a visit to be evaluated.

## 2020-10-31 NOTE — Telephone Encounter (Signed)
Left message to call back. Pt will need virtual appt. OK for PEC to schedule when pt calls back.

## 2020-10-31 NOTE — Telephone Encounter (Signed)
Pt is calling back to notify Dr. B that his sx have resolved.

## 2020-11-23 NOTE — Progress Notes (Signed)
[place] Phone: [phone] Subjective:   [name], am serving as a scribe for Dr. Hulan Saas. This visit occurred during the SARS-CoV-2 public health emergency.  Safety protocols were in place, including screening questions prior to the visit, additional usage of staff PPE, and extensive cleaning of exam room while observing appropriate contact time as indicated for disinfecting solutions.   I'm seeing this patient by the request  of:  Chrismon, [name], PA-C  CC: Foot pain follow-up, left shoulder pain follow-up, new thumb pain  [id number]   07/27/2020 Patient has a rotator cuff tear and does now have some moderate glenohumeral and acromioclavicular arthritis.  Discussed with patient about different treatment options.  We discussed again about an MRI which patient declined.  Patient states that at this point he would not want any surgical intervention and feels like he does relatively well unless he does a lot of heavy lifting.  He does notice that the shoulder fatigues mild discomfort at night but otherwise can do all daily activities.  We discussed continuing the home exercises.  If worsening symptoms we can consider injections.  Patient understands this and will follow up with me again 4 months  Update 11/24/2020 STPEHEN PETITJEAN is a 70 y.o. male coming in with complaint of L shoulder pain. Pain continues with pushing motions and flexion.  Patient has known arthritic changes and a rotator cuff tear.  Patient states that it is bad but would not want any true surgical intervention for it at this moment.  Patient states that the injections seem to be helpful.  Does feel ike gout in L foot great toe. Would like to discuss allopurinol and tart cherry extract.  Patient states that it has been better overall but is wondering if there is a possibility even getting better.  Also c/o L thumb pain in [facility] joint  that has been worsening over time. Difficultly with twisting open bottles.  Patient denies though any true swelling.  Does not remember any true injury.      Past Medical History:  Diagnosis Date  . Arthritis   . Gout   . HTN (hypertension)   . Hyperlipidemia   . Hypogonadism in male   . Low back pain   . Morton neuroma, left    Past Surgical History:  Procedure Laterality Date  . ACHILLES TENDON SURGERY Right 1995  . HERNIA REPAIR  2002   double  . Surgery on Left pinky in 2000     Social History   Socioeconomic History  . Marital status: Married    Spouse name: Not on file  . Number of children: 2  . Years of education: Not on file  . Highest education level: Bachelor's degree (e.g., BA, AB, BS)  Occupational History  . Occupation: Camera operator    Comment: retired  Tobacco Use  . Smoking status: Never Smoker  . Smokeless tobacco: Former Systems developer    Types: Snuff  . Tobacco comment: quit snuff at age 26  Vaping Use  . Vaping Use: Never used  Substance and Sexual Activity  . Alcohol use: Yes    Alcohol/week: 2.0 - 4.0 standard drinks    Types: 2 - 4 Cans of beer per week  . Drug use: No  . Sexual activity: Not on file  Other Topics Concern  . Not on file  Social History Narrative  . Not on file   Social Determinants  of Health   Financial Resource Strain: Low Risk   . Difficulty of Paying Living Expenses: Not hard at all  Food Insecurity: No Food Insecurity  . Worried About Charity fundraiser in the Last Year: Never true  . Ran Out of Food in the Last Year: Never true  Transportation Needs: No Transportation Needs  . Lack of Transportation (Medical): No  . Lack of Transportation (Non-Medical): No  Physical Activity: Sufficiently Active  . Days of Exercise per Week: 3 days  . Minutes of Exercise per Session: 60 min  Stress: No Stress Concern Present  . Feeling of Stress : Not at all  Social Connections: Moderately Integrated  . Frequency of  Communication with Friends and Family: More than three times a week  . Frequency of Social Gatherings with Friends and Family: More than three times a week  . Attends Religious Services: More than 4 times per year  . Active Member of Clubs or Organizations: No  . Attends Archivist Meetings: Never  . Marital Status: Married   No Known Allergies Family History  Problem Relation Age of Onset  . Hypertension Mother   . Arthritis Mother   . Vision loss Mother        due to age  . Cancer Father        in the liver    Current Outpatient Medications (Endocrine & Metabolic):  .  testosterone cypionate (DEPOTESTOSTERONE CYPIONATE) 200 MG/ML injection, Inject 0.5 cc every fourteen days  Current Outpatient Medications (Cardiovascular):  .  lisinopril-hydrochlorothiazide (ZESTORETIC) 20-25 MG tablet, Take 1 tablet by mouth daily. .  pravastatin (PRAVACHOL) 40 MG tablet, TAKE 1 TABLET BY MOUTH EVERY DAY .  sildenafil (REVATIO) 20 MG tablet, Take 1 tablet (20 mg total) by mouth as needed. Take 3-5 tablets as needed prior to intercouse   Current Outpatient Medications (Analgesics):  .  allopurinol (ZYLOPRIM) 300 MG tablet, Take 1 tablet (300 mg total) by mouth daily.   Current Outpatient Medications (Other):  .  Apoaequorin (PREVAGEN PO), Take 1 tablet by mouth daily. .  Hypodermic Needles-Disposable (SAFETY-GARD NEEDLE 18G) MISC, 0.5 mLs by Does not apply route every 14 (fourteen) days. .  Multiple Vitamin (MULTIVITAMIN) tablet, Take 1 tablet by mouth daily. [name] Kitchen  NEEDLE, DISP, 21 G (BD SAFETYGLIDE SHIELDED NEEDLE) 21G X 1-1/2" MISC, 0.5 mLs by Does not apply route every 14 (fourteen) days. .  NON FORMULARY, Gout Relief 1/2 oz daily .  Syringe, Disposable, 3 ML MISC, 0.5 mLs by Does not apply route every 14 (fourteen) days.   Reviewed prior external information including notes and imaging from  primary care provider As well as notes that were available from care everywhere and  other healthcare systems.  Past medical history, social, surgical and family history all reviewed in electronic medical record.  No pertanent information unless stated regarding to the chief complaint.   Review of Systems:  No headache, visual changes, nausea, vomiting, diarrhea, constipation, dizziness, abdominal pain, skin rash, fevers, chills, night sweats, weight loss, swollen lymph nodes, body aches, joint swelling, chest pain, shortness of breath, mood changes. POSITIVE muscle aches  Objective  Blood pressure 128/80, pulse 75, height 5\' 9"  (1.753 m), weight 202 lb (91.6 kg), SpO2 98 %.   General: No apparent distress alert and oriented x3 mood and affect normal, dressed appropriately.  HEENT: Pupils equal, extraocular movements intact  Respiratory: Patient's speak in full sentences and does not appear short of breath  Cardiovascular: No lower  extremity edema, non tender, no erythema  Gait normal with good balance and coordination.  MSK: Left shoulder shows the patient does have positive impingement.  Decreased range of motion in all planes.  Mild crepitus noted.  Rotator cuff strength 4 out of 5 compared to the contralateral side.  Positive crossover noted as well.  Left hand exam shows the patient's left CMC joint does have a very mild positive grind test noted.  Good grip strength noted.  No thenar eminence wasting noted.  Neurovascularly intact distally.  Right foot exam shows the patient's MTP and first toe does have some mild gouty tophi noted.  Tender to palpation.  Procedure: Real-time Ultrasound Guided Injection of left glenohumeral joint Device: GE Logiq E  Ultrasound guided injection is preferred based studies that show increased duration, increased effect, greater accuracy, decreased procedural pain, increased response rate with ultrasound guided versus blind injection.  Verbal informed consent obtained.  Time-out conducted.  Noted no overlying erythema, induration, or  other signs of local infection.  Skin prepped in a sterile fashion.  Local anesthesia: Topical Ethyl chloride.  With sterile technique and under real time ultrasound guidance:  Joint visualized.  21g 2 inch needle inserted posterior approach. Pictures taken for needle placement. Patient did have injection of 2 cc of 0.5% Marcaine, and 1cc of Kenalog 40 mg/dL. Completed without difficulty  Pain immediately resolved suggesting accurate placement of the medication.  Advised to call if fevers/chills, erythema, induration, drainage, or persistent bleeding.  Impression: Technically successful ultrasound guided injection.  Procedure: Real-time Ultrasound Guided Injection of left acromioclavicular joint Device: GE Logiq Q7 Ultrasound guided injection is preferred based studies that show increased duration, increased effect, greater accuracy, decreased procedural pain, increased response rate, and decreased cost with ultrasound guided versus blind injection.  Verbal informed consent obtained.  Time-out conducted.  Noted no overlying erythema, induration, or other signs of local infection.  Skin prepped in a sterile fashion.  Local anesthesia: Topical Ethyl chloride.  With sterile technique and under real time ultrasound guidance: With a 25-gauge half inch needle injected with 0.5 cc of 0.5% Marcaine and 0.5 cc of Kenalog 40 mg/mL Completed without difficulty  Pain immediately resolved suggesting accurate placement of the medication.  Advised to call if fevers/chills, erythema, induration, drainage, or persistent bleeding.  Impression: Technically successful ultrasound guided injection.   Impression and Recommendations:     The above documentation has been reviewed and is accurate and complete Lyndal Pulley, DO

## 2020-11-24 ENCOUNTER — Ambulatory Visit: Payer: Self-pay

## 2020-11-24 ENCOUNTER — Encounter: Payer: Self-pay | Admitting: Family Medicine

## 2020-11-24 ENCOUNTER — Ambulatory Visit: Payer: Medicare Other | Admitting: Family Medicine

## 2020-11-24 ENCOUNTER — Other Ambulatory Visit: Payer: Self-pay

## 2020-11-24 VITALS — BP 128/80 | HR 75 | Ht 69.0 in | Wt 202.0 lb

## 2020-11-24 DIAGNOSIS — M25512 Pain in left shoulder: Secondary | ICD-10-CM | POA: Diagnosis not present

## 2020-11-24 DIAGNOSIS — M1A09X1 Idiopathic chronic gout, multiple sites, with tophus (tophi): Secondary | ICD-10-CM

## 2020-11-24 DIAGNOSIS — G8929 Other chronic pain: Secondary | ICD-10-CM

## 2020-11-24 DIAGNOSIS — M2550 Pain in unspecified joint: Secondary | ICD-10-CM | POA: Diagnosis not present

## 2020-11-24 DIAGNOSIS — M12812 Other specific arthropathies, not elsewhere classified, left shoulder: Secondary | ICD-10-CM | POA: Diagnosis not present

## 2020-11-24 DIAGNOSIS — M75102 Unspecified rotator cuff tear or rupture of left shoulder, not specified as traumatic: Secondary | ICD-10-CM

## 2020-11-24 DIAGNOSIS — M19012 Primary osteoarthritis, left shoulder: Secondary | ICD-10-CM

## 2020-11-24 LAB — CBC WITH DIFFERENTIAL/PLATELET
Basophils Absolute: 0 10*3/uL (ref 0.0–0.1)
Basophils Relative: 0.6 % (ref 0.0–3.0)
Eosinophils Absolute: 0.1 10*3/uL (ref 0.0–0.7)
Eosinophils Relative: 1.7 % (ref 0.0–5.0)
HCT: 43.6 % (ref 39.0–52.0)
Hemoglobin: 14.9 g/dL (ref 13.0–17.0)
Lymphocytes Relative: 27.4 % (ref 12.0–46.0)
Lymphs Abs: 2.2 10*3/uL (ref 0.7–4.0)
MCHC: 34.1 g/dL (ref 30.0–36.0)
MCV: 87.6 fl (ref 78.0–100.0)
Monocytes Absolute: 0.9 10*3/uL (ref 0.1–1.0)
Monocytes Relative: 11.5 % (ref 3.0–12.0)
Neutro Abs: 4.7 10*3/uL (ref 1.4–7.7)
Neutrophils Relative %: 58.8 % (ref 43.0–77.0)
Platelets: 161 10*3/uL (ref 150.0–400.0)
RBC: 4.98 Mil/uL (ref 4.22–5.81)
RDW: 14.1 % (ref 11.5–15.5)
WBC: 8 10*3/uL (ref 4.0–10.5)

## 2020-11-24 LAB — COMPREHENSIVE METABOLIC PANEL
ALT: 23 U/L (ref 0–53)
AST: 24 U/L (ref 0–37)
Albumin: 4.7 g/dL (ref 3.5–5.2)
Alkaline Phosphatase: 38 U/L — ABNORMAL LOW (ref 39–117)
BUN: 17 mg/dL (ref 6–23)
CO2: 29 mEq/L (ref 19–32)
Calcium: 9.6 mg/dL (ref 8.4–10.5)
Chloride: 103 mEq/L (ref 96–112)
Creatinine, Ser: 0.96 mg/dL (ref 0.40–1.50)
GFR: 80.54 mL/min (ref >60.00)
Glucose, Bld: 94 mg/dL (ref 70–99)
Potassium: 3.8 mEq/L (ref 3.5–5.1)
Sodium: 140 mEq/L (ref 135–145)
Total Bilirubin: 0.7 mg/dL (ref 0.2–1.2)
Total Protein: 7.3 g/dL (ref 6.0–8.3)

## 2020-11-24 MED ORDER — ALLOPURINOL 300 MG PO TABS: 300.0000 mg | ORAL_TABLET | Freq: Every day | ORAL | 3 refills | Status: DC

## 2020-11-24 NOTE — Assessment & Plan Note (Signed)
Patient has made some improvement but would like to possibly go up on the allopurinol.  We will get labs to make sure that patient's kidney function is doing okay.  Warned of potential side effects including rash and to discontinue if this occurs.  Patient has done relatively well though over the course of time.  Follow-up with me again 6 to 12 weeks

## 2020-11-24 NOTE — Assessment & Plan Note (Signed)
Patient given injection and tolerated the procedure well.  Discussed icing regimen and home exercises.  Patient does have some signs that is still significant for the arthritic changes and we did discuss advanced imaging that I think could be beneficial to make sure patient has the right information to decide if he would like surgery or not.  Patient declined an MRI at this time.  Hoping that the injection helps.  Follow-up with me again in 4 to 6 weeks

## 2020-11-24 NOTE — Assessment & Plan Note (Signed)
Patient given a repeat injection today.  Tolerated the procedure well.  Discussed icing regimen and home exercises.  Discussed avoiding certain activities.  Follow-up with me again 6 to 8 weeks.  Once again discussed with patient about the possibility of advanced imaging but patient declined today.

## 2020-11-24 NOTE — Patient Instructions (Addendum)
Injected shoulder today in 2 spots Wrist brace at night 2 weeks then as needed Heat on thumb Lab today Allopurinol 300mg  daily  Any rash occurs with new dose seek medical attention, but highly unlikely Have appt in 2-3 months

## 2020-11-25 ENCOUNTER — Ambulatory Visit: Payer: Medicare Other | Admitting: Family Medicine

## 2020-12-19 ENCOUNTER — Ambulatory Visit: Payer: Medicare Other | Admitting: Dermatology

## 2020-12-19 ENCOUNTER — Other Ambulatory Visit: Payer: Self-pay

## 2020-12-19 ENCOUNTER — Encounter: Payer: Self-pay | Admitting: Dermatology

## 2020-12-19 DIAGNOSIS — L700 Acne vulgaris: Secondary | ICD-10-CM

## 2020-12-19 DIAGNOSIS — L814 Other melanin hyperpigmentation: Secondary | ICD-10-CM

## 2020-12-19 DIAGNOSIS — Z86018 Personal history of other benign neoplasm: Secondary | ICD-10-CM

## 2020-12-19 DIAGNOSIS — L820 Inflamed seborrheic keratosis: Secondary | ICD-10-CM

## 2020-12-19 DIAGNOSIS — L821 Other seborrheic keratosis: Secondary | ICD-10-CM

## 2020-12-19 DIAGNOSIS — D1800 Hemangioma unspecified site: Secondary | ICD-10-CM

## 2020-12-19 DIAGNOSIS — D2261 Melanocytic nevi of right upper limb, including shoulder: Secondary | ICD-10-CM

## 2020-12-19 DIAGNOSIS — D229 Melanocytic nevi, unspecified: Secondary | ICD-10-CM

## 2020-12-19 DIAGNOSIS — D492 Neoplasm of unspecified behavior of bone, soft tissue, and skin: Secondary | ICD-10-CM

## 2020-12-19 DIAGNOSIS — L570 Actinic keratosis: Secondary | ICD-10-CM

## 2020-12-19 DIAGNOSIS — L578 Other skin changes due to chronic exposure to nonionizing radiation: Secondary | ICD-10-CM

## 2020-12-19 DIAGNOSIS — Z1283 Encounter for screening for malignant neoplasm of skin: Secondary | ICD-10-CM

## 2020-12-19 NOTE — Progress Notes (Signed)
Follow-Up Visit   Subjective  Curtis [name] is a 70 y.o. male who presents for the following: Upper body skin exam (Hx of Dysplastic nevus, L lower flank) and check spots (R shoulder, >5 yrs, rubs at/Growth under L  eye, > 47m, irritated by glasses).   The following portions of the chart were reviewed this encounter and updated as appropriate:       Review of Systems:  No other skin or systemic complaints except as noted in HPI or Assessment and Plan.  Objective  Well appearing patient in no apparent distress; mood and affect are within normal limits.  A full examination was performed including scalp, head, eyes, ears, nose, lips, neck, chest, axillae, abdomen, back, buttocks, bilateral upper extremities, bilateral lower extremities, hands, feet, fingers, toes, fingernails, and toenails. All findings within normal limits unless otherwise noted below.  Objective  L lower flank: Scar with no evidence of recurrence.   Objective  Right shoulder: 4.59mm flesh pap     Objective  R upper sternum: 5.4mm pink brown pap slighly pearly     Objective  L infra ocular x 2, L flank x 1 (3): Erythematous keratotic or waxy stuck-on papule  Objective  L paranasal: Open comedones   Assessment & Plan    Lentigines - Scattered tan macules - Due to sun exposure - Benign-appering, observe - Recommend daily broad spectrum sunscreen SPF 30+ to sun-exposed areas, reapply every 2 hours as needed. - Call for any changes  Seborrheic Keratoses - Stuck-on, waxy, tan-brown papules and/or plaques  - Benign-appearing - Discussed benign etiology and prognosis. - Observe - Call for any changes  Melanocytic Nevi - Tan-brown and/or pink-flesh-colored symmetric macules and papules - Benign appearing on exam today - Observation - Call clinic for new or changing moles - Recommend daily use of broad spectrum spf 30+ sunscreen to sun-exposed areas.   Hemangiomas - Red papules - Discussed  benign nature - Observe - Call for any changes  Actinic Damage - Chronic condition, secondary to cumulative UV/sun exposure - diffuse scaly erythematous macules with underlying dyspigmentation - Recommend daily broad spectrum sunscreen SPF 30+ to sun-exposed areas, reapply every 2 hours as needed.  - Staying in the shade or wearing long sleeves, sun glasses (UVA+UVB protection) and wide brim hats (4-inch brim around the entire circumference of the hat) are also recommended for sun protection.  - Call for new or changing lesions.  Skin cancer screening performed today.   History of dysplastic nevus L lower flank  Clear. Observe for recurrence. Call clinic for new or changing lesions.  Recommend regular skin exams, daily broad-spectrum spf 30+ sunscreen use, and photoprotection.     Neoplasm of skin (2) Right shoulder  Epidermal / dermal shaving  Lesion diameter (cm):  0.4 Informed consent: discussed and consent obtained   Patient was prepped and draped in usual sterile fashion: area prepped with alcohol. Anesthesia: the lesion was anesthetized in a standard fashion   Anesthetic:  1% lidocaine w/ epinephrine 1-100,000 buffered w/ 8.4% NaHCO3 Instrument used: flexible razor blade   Hemostasis achieved with: pressure, aluminum chloride and electrodesiccation   Outcome: patient tolerated procedure well   Post-procedure details: wound care instructions given   Post-procedure details comment:  Ointment and small bandage applied  Specimen 1 - Surgical pathology Differential Diagnosis: D48.5 Irritated nevus r/o Atypia  Check Margins: yes 4.7mm flesh pap  R upper sternum  Skin / nail biopsy Type of biopsy: tangential   Informed consent: discussed  and consent obtained   Anesthesia: the lesion was anesthetized in a standard fashion   Anesthesia comment:  Area prepped with alcohol Anesthetic:  1% lidocaine w/ epinephrine 1-100,000 buffered w/ 8.4% NaHCO3 Instrument used: flexible  razor blade   Hemostasis achieved with: pressure, aluminum chloride and electrodesiccation   Outcome: patient tolerated procedure well   Post-procedure details: wound care instructions given   Post-procedure details comment:  Ointment and small bandage applied  Specimen 2 - Surgical pathology Differential Diagnosis: D48.5 ISK R/O BCC  Check Margins: No 5.78mm pink brown pap slighly pearly  Inflamed seborrheic keratosis (3) L infra ocular x 2, L flank x 1  Prior to procedure, discussed risks of blister formation, small wound, skin dyspigmentation, or rare scar following cryotherapy.    Destruction of lesion - L infra ocular x 2, L flank x 1  Destruction method: cryotherapy   Informed consent: discussed and consent obtained   Lesion destroyed using liquid nitrogen: Yes   Region frozen until ice ball extended beyond lesion: Yes   Outcome: patient tolerated procedure well with no complications   Post-procedure details: wound care instructions given    Acne vulgaris L paranasal  Start Arazlo lotion qhs to aa as tolerated, sample x 1 Lot [id number] exp 10/2022  Return in about 1 year (around 12/19/2021) for TBSE, Hx of Dysplastic nevi.   I, Othelia Pulling, RMA, am acting as scribe for Brendolyn Patty, MD . Documentation: I have reviewed the above documentation for accuracy and completeness, and I agree with the above.  Brendolyn Patty MD

## 2020-12-19 NOTE — Patient Instructions (Addendum)
If you have any questions or concerns for your doctor, please call our main line at [phone] and press option 4 to reach your doctor's medical assistant. If no one answers, please leave a voicemail as directed and we will return your call as soon as possible. Messages left after 4 pm will be answered the following business day.   You may also send [place] a message via [place]. We typically respond to MyChart messages within 1-2 business days.  For prescription refills, please ask your pharmacy to contact our office. Our fax number is [phone].  If you have an urgent issue when the clinic is closed that cannot wait until the next business day, you can page your doctor at the number below.    Please note that while we do our best to be available for urgent issues outside of office hours, we are not available 24/7.   If you have an urgent issue and are unable to reach [place], you may choose to seek medical care at your doctor's office, retail clinic, urgent care center, or emergency room.  If you have a medical emergency, please immediately call 911 or go to the emergency department.  Pager Numbers  - Dr. Nehemiah Massed: [phone]  - Dr. Laurence Ferrari: [phone]  - Dr. Nicole Kindred: [phone]  In the event of inclement weather, please call our main line at [phone] for an update on the status of any delays or closures.  Dermatology Medication Tips: Please keep the boxes that topical medications come in in order to help keep track of the instructions about where and how to use these. Pharmacies typically print the medication instructions only on the boxes and not directly on the medication tubes.   If your medication is too expensive, please contact our office at [phone] option 4 or send [place] a message through [place].   We are unable to tell what your co-pay for medications will be in advance as this is different depending on your insurance coverage. However, we may be able to find a substitute  medication at lower cost or fill out paperwork to get insurance to cover a needed medication.   If a prior authorization is required to get your medication covered by your insurance company, please allow [place] 1-2 business days to complete this process.  Drug prices often vary depending on where the prescription is filled and some pharmacies may offer cheaper prices.  The website www.goodrx.com contains coupons for medications through different pharmacies. The prices here do not account for what the cost may be with help from insurance (it may be cheaper with your insurance), but the website can give you the price if you did not use any insurance.  - You can print the associated coupon and take it with your prescription to the pharmacy.  - You may also stop by our office during regular business hours and pick up a GoodRx coupon card.  - If you need your prescription sent electronically to a different pharmacy, notify our office through [facility] or by phone at [phone] option 4.    Arazlo lotion Apply small amount nightly to blackheads on left nose  Wound Care Instructions  1. Cleanse wound gently with soap and water once a day then pat dry with clean gauze. Apply a thing coat of Petrolatum (petroleum jelly, "Vaseline") over the wound (unless you have an allergy to this). We recommend that you use a new, sterile tube of Vaseline. Do not pick or remove scabs. Do not remove the  yellow or white "healing tissue" from the base of the wound.  2. Cover the wound with fresh, clean, nonstick gauze and secure with paper tape. You may use Band-Aids in place of gauze and tape if the would is small enough, but would recommend trimming much of the tape off as there is often too much. Sometimes Band-Aids can irritate the skin.  3. You should call the office for your biopsy report after 1 week if you have not already been contacted.  4. If you experience any problems, such as abnormal amounts of  bleeding, swelling, significant bruising, significant pain, or evidence of infection, please call the office immediately.  5. FOR ADULT SURGERY PATIENTS: If you need something for pain relief you may take 1 extra strength Tylenol (acetaminophen) AND 2 Ibuprofen (200mg  each) together every 4 hours as needed for pain. (do not take these if you are allergic to them or if you have a reason you should not take them.) Typically, you may only need pain medication for 1 to 3 days.

## 2020-12-27 ENCOUNTER — Telehealth: Payer: Self-pay

## 2020-12-27 NOTE — Telephone Encounter (Signed)
-----   Message from Brendolyn Patty, MD sent at 12/26/2020  3:21 PM EDT ----- 1. Skin , right shoulder MELANOCYTIC NEVUS, INTRADERMAL TYPE, BASE INVOLVED 2. Skin , right upper sternum LICHENOID KERATOSIS  1. Benign irritated mole 2. Benign inflamed keratosis

## 2020-12-27 NOTE — Telephone Encounter (Signed)
Left pt detailed message advising bx results and to call if any questions./sh

## 2020-12-31 ENCOUNTER — Other Ambulatory Visit: Payer: Self-pay | Admitting: Family Medicine

## 2021-01-26 ENCOUNTER — Other Ambulatory Visit: Payer: Self-pay | Admitting: Family Medicine

## 2021-01-26 DIAGNOSIS — I10 Essential (primary) hypertension: Secondary | ICD-10-CM

## 2021-01-26 NOTE — Telephone Encounter (Signed)
Notes to clinic:  Patient was scheduled for appt on 01/31/2021 and it was canceled by provider  Review for refills   Requested Prescriptions  Pending Prescriptions Disp Refills   lisinopril-hydrochlorothiazide (ZESTORETIC) 20-25 MG tablet [Pharmacy Med Name: LISINOPRIL-HCTZ 20/25MG  TABLETS] 90 tablet 3    Sig: Take 1 tablet by mouth daily.      Cardiovascular:  ACEI + Diuretic Combos Failed - 01/26/2021  7:02 AM      Failed - Valid encounter within last 6 months    Recent Outpatient Visits           12 months ago Essential hypertension   [facility], [name], PA-C   2 years ago Essential hypertension   [facility], [name], PA-C   2 years ago Chronic left shoulder pain   [facility], [name], PA-C   3 years ago Annual physical exam   [facility], [name], PA-C   3 years ago Essential hypertension   [facility], [name], PA-C       Future Appointments             In 6 days Lyndal Pulley, DO [place]   In 7 months McGowan, Gordan Payment [facility]             Passed - Na in normal range and within 180 days    Sodium  Date Value Ref Range Status  11/24/2020 140 135 - 145 mEq/L Final  01/28/2020 140 134 - 144 mmol/L Final          Passed - K in normal range and within 180 days    Potassium  Date Value Ref Range Status  11/24/2020 3.8 3.5 - 5.1 mEq/L Final          Passed - Cr in normal range and within 180 days    Creatinine, Ser  Date Value Ref Range Status  11/24/2020 0.96 0.40 - 1.50 mg/dL Final          Passed - Ca in normal range and within 180 days    Calcium  Date Value Ref Range Status  11/24/2020 9.6 8.4 - 10.5 mg/dL Final          Passed - Patient is not pregnant      Passed - Last BP in normal range    BP Readings from Last 1 Encounters:  11/24/20 128/80             pravastatin (PRAVACHOL) 40 MG tablet [Pharmacy Med Name: PRAVASTATIN 40MG  TABLETS] 90 tablet 2    Sig: TAKE 1 TABLET BY MOUTH EVERY DAY      Cardiovascular:  Antilipid - Statins Failed - 01/26/2021  7:02 AM      Failed - Total Cholesterol in normal range and within 360 days    Cholesterol, Total  Date Value Ref Range Status  01/28/2020 177 100 - 199 mg/dL Final          Failed - LDL in normal range and within 360 days    LDL Chol Calc (NIH)  Date Value Ref Range Status  01/28/2020 105 (H) 0 - 99 mg/dL Final          Failed - HDL in normal range and within 360 days    HDL  Date Value Ref Range Status  01/28/2020 41 >39 mg/dL Final          Failed - Triglycerides  in normal range and within 360 days    Triglycerides  Date Value Ref Range Status  01/28/2020 178 (H) 0 - 149 mg/dL Final          Failed - Valid encounter within last 12 months    Recent Outpatient Visits           12 months ago Essential hypertension   [place], [name], PA-C   2 years ago Essential hypertension   [facility], [name], PA-C   2 years ago Chronic left shoulder pain   [facility], [name], PA-C   3 years ago Annual physical exam   [facility], [name], PA-C   3 years ago Essential hypertension   [facility], [name], PA-C       Future Appointments             In 6 days Lyndal Pulley, [place]   In 7 months McGowan, Gordan Payment [facility] - Patient is not pregnant

## 2021-01-31 NOTE — Progress Notes (Signed)
[facility] [address] [place] Phone: [phone] Subjective:   I Curtis [name] am serving as a Education administrator for Dr. Hulan Saas.  This visit occurred during the SARS-CoV-2 public health emergency.  Safety protocols were in place, including screening questions prior to the visit, additional usage of staff PPE, and extensive cleaning of exam room while observing appropriate contact time as indicated for disinfecting solutions.   I'm seeing this patient by the request  of:  [place], [name], PA-C  CC: shoulder pain follow up   LOV:[id number]  11/24/2020 Patient given a repeat injection today.  Tolerated the procedure well.  Discussed icing regimen and home exercises.  Discussed avoiding certain activities.  Follow-up with me again 6 to 8 weeks.  Once again discussed with patient about the possibility of advanced imaging but patient declined today.  Patient has made some improvement but would like to possibly go up on the allopurinol.  We will get labs to make sure that patient's kidney function is doing okay.  Warned of potential side effects including rash and to discontinue if this occurs.  Patient has done relatively well though over the course of time.  Follow-up with me again 6 to 12 weeks  Update 02/01/2021 Curtis [name] is a 70 y.o. male coming in with complaint of L shoulder pain.  Patient at last visit was given an injection in the acromioclavicular joint and glenohumeral joint.  Patient states he is feeling about the same. States he does not feel like he has made any progress.   Patient also was found to have gout and we increase patient's allopurinol to 300 mg after checking labs.   Patient has had x-rays of the shoulder done as well.  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 Past Medical History:  Diagnosis Date   Arthritis    Gout     HTN (hypertension)    Hx of dysplastic nevus 12/14/2019   L lower flank Moderate atypia   Hyperlipidemia    Hypogonadism in male    Low back pain    Morton neuroma, left    Past Surgical History:  Procedure Laterality Date   ACHILLES TENDON SURGERY Right 1995   HERNIA REPAIR  2002   double   Surgery on Left pinky in 2000     Social History   Socioeconomic History   Marital status: Married    Spouse name: Not on file   Number of children: 2   Years of education: Not on file   Highest education level: Bachelor's degree (e.g., BA, AB, BS)  Occupational History   Occupation: Camera operator    Comment: retired  Tobacco Use   Smoking status: Never   Smokeless tobacco: Former    Types: Snuff   Tobacco comments:    quit snuff at age 60  Vaping Use   Vaping Use: Never used  Substance and Sexual Activity   Alcohol use: Yes    Alcohol/week: 2.0 - 4.0 standard drinks    Types: 2 - 4 Cans of beer per week   Drug use: No   Sexual activity: Not on file  Other Topics Concern   Not on file  Social History Narrative   Not on file   Social Determinants of Health   Financial Resource Strain: Not on file  Food Insecurity: Not on file  Transportation Needs: Not on file  Physical Activity: Not on  file  Stress: Not on file  Social Connections: Not on file   No Known Allergies Family History  Problem Relation Age of Onset   Hypertension Mother    Arthritis Mother    Vision loss Mother        due to age   Cancer Father        in the liver    Current Outpatient Medications (Endocrine & Metabolic):    testosterone cypionate (DEPOTESTOSTERONE CYPIONATE) 200 MG/ML injection, Inject 0.5 cc every fourteen days  Current Outpatient Medications (Cardiovascular):    lisinopril-hydrochlorothiazide (ZESTORETIC) 20-25 MG tablet, Take 1 tablet by mouth daily. Please schedule office visit before any future refills.   pravastatin (PRAVACHOL) 40 MG tablet, Take 1 tablet (40 mg  total) by mouth daily. Please schedule office visit before any future refills.   sildenafil (REVATIO) 20 MG tablet, Take 1 tablet (20 mg total) by mouth as needed. Take 3-5 tablets as needed prior to intercouse   Current Outpatient Medications (Analgesics):    allopurinol (ZYLOPRIM) 300 MG tablet, Take 1 tablet (300 mg total) by mouth daily.   Current Outpatient Medications (Other):    Apoaequorin (PREVAGEN PO), Take 1 tablet by mouth daily.   Hypodermic Needles-Disposable (SAFETY-GARD NEEDLE 18G) MISC, 0.5 mLs by Does not apply route every 14 (fourteen) days.   Multiple Vitamin (MULTIVITAMIN) tablet, Take 1 tablet by mouth daily.   NEEDLE, DISP, 21 G (BD SAFETYGLIDE SHIELDED NEEDLE) 21G X 1-1/2" MISC, 0.5 mLs by Does not apply route every 14 (fourteen) days.   NON FORMULARY, Gout Relief 1/2 oz daily   Syringe, Disposable, 3 ML MISC, 0.5 mLs by Does not apply route every 14 (fourteen) days.   Reviewed prior external information including notes and imaging from  primary care provider As well as notes that were available from care everywhere and other healthcare systems.  Past medical history, social, surgical and family history all reviewed in electronic medical record.  No pertanent information unless stated regarding to the chief complaint.   Review of Systems:  No headache, visual changes, nausea, vomiting, diarrhea, constipation, dizziness, abdominal pain, skin rash, fevers, chills, night sweats, weight loss, swollen lymph nodes, body aches, joint swelling, chest pain, shortness of breath, mood changes. POSITIVE muscle aches  Objective  Blood pressure (!) 142/82, pulse 66, height 5\' 9"  (1.753 m), weight 206 lb (93.4 kg), SpO2 97 %.   General: No apparent distress alert and oriented x3 mood and affect normal, dressed appropriately.  HEENT: Pupils equal, extraocular movements intact  Respiratory: Patient's speak in full sentences and does not appear short of breath  Cardiovascular:  No lower extremity edema, non tender, no erythema  Gait normal with good balance and coordination.  MSK: Left shoulder exam shows patient does have some mild decrease in range of motion lacking the last 5 degrees of forward flexion, the last 10 degrees of external rotation and internal rotation to sacrum.  Patient has 4+ out of 5 strength of the rotator cuff but this is weaker than the contralateral side.  Very mild crepitus noted.   Impression and Recommendations:     The above documentation has been reviewed and is accurate and complete Lyndal Pulley, DO

## 2021-02-01 ENCOUNTER — Ambulatory Visit: Payer: Medicare Other | Admitting: Family Medicine

## 2021-02-01 ENCOUNTER — Other Ambulatory Visit: Payer: Self-pay

## 2021-02-01 ENCOUNTER — Encounter: Payer: Self-pay | Admitting: Family Medicine

## 2021-02-01 DIAGNOSIS — M12812 Other specific arthropathies, not elsewhere classified, left shoulder: Secondary | ICD-10-CM

## 2021-02-01 DIAGNOSIS — M75102 Unspecified rotator cuff tear or rupture of left shoulder, not specified as traumatic: Secondary | ICD-10-CM

## 2021-02-01 NOTE — Assessment & Plan Note (Signed)
Patient does have what appears to be more of a rotator cuff arthropathy.  Patient still has good strength but continues to have pain on a daily basis that is affecting daily activities and waking him up at night.  Failed all other conservative therapy at this time.  Do believe that advanced imaging is warranted.  We will get MRI arthrogram done to further evaluate.  Patient willing to consider surgical intervention if necessary.  Follow-up with me again after imaging to discuss further.

## 2021-02-01 NOTE — Patient Instructions (Addendum)
Good to see you Read about PRP [facility] [phone] Choline 500 mg daily to help with brain fog We will talk after MRI

## 2021-02-14 ENCOUNTER — Other Ambulatory Visit: Payer: Self-pay

## 2021-02-14 ENCOUNTER — Ambulatory Visit
Admission: RE | Admit: 2021-02-14 | Discharge: 2021-02-14 | Disposition: A | Discharge status: Home / Self Care | Payer: Medicare Other | Source: Ambulatory Visit | Attending: Family Medicine | Admitting: Family Medicine

## 2021-02-14 ENCOUNTER — Other Ambulatory Visit: Payer: Medicare Other

## 2021-02-14 DIAGNOSIS — M75102 Unspecified rotator cuff tear or rupture of left shoulder, not specified as traumatic: Secondary | ICD-10-CM

## 2021-02-14 DIAGNOSIS — M12812 Other specific arthropathies, not elsewhere classified, left shoulder: Secondary | ICD-10-CM

## 2021-02-14 IMAGING — MR MR SHOULDER*L* W/ CM
6 series · 40 of 40 positions shown · IV contrast (agent unspecified)
Comparison: X-ray [DATE]

CLINICAL DATA: Left shoulder pain

EXAM:
MR ARTHROGRAM OF THE LEFT SHOULDER
TECHNIQUE: Multiplanar, multisequence MR imaging of the left shoulder was
performed following the administration of intra-articular contrast.
CONTRAST:  See Injection Documentation.

[Series 3: T1 fat-sat · axial · 4.0mm · 0.29mm/px · z∈[-57,+44]mm · 9 of 22 slices shown (1 of 3)]
[im 1/22]
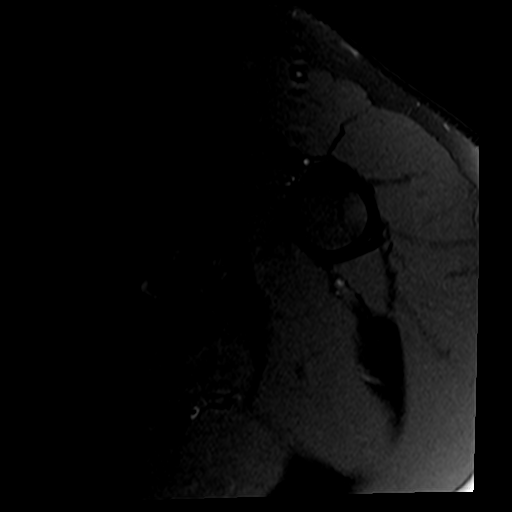
[im 3/22]
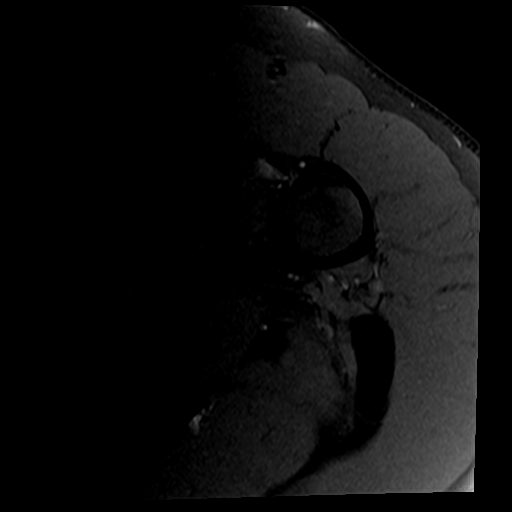
[im 6/22]
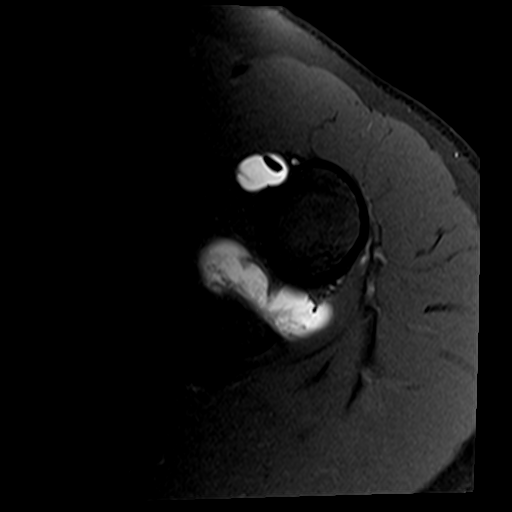
[im 8/22]
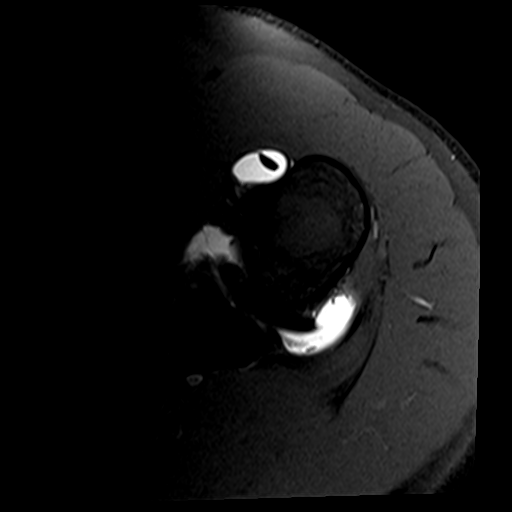
[im 11/22]
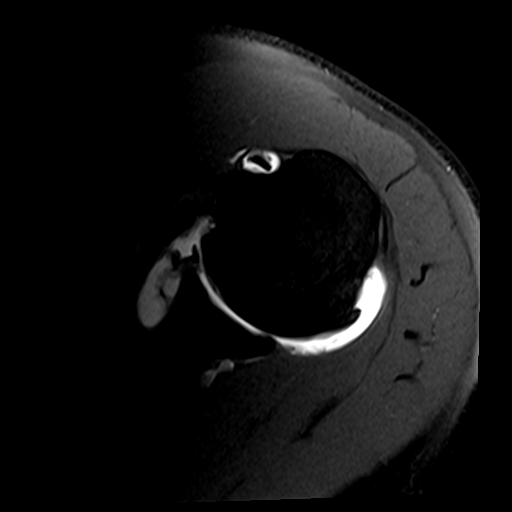
[im 14/22]
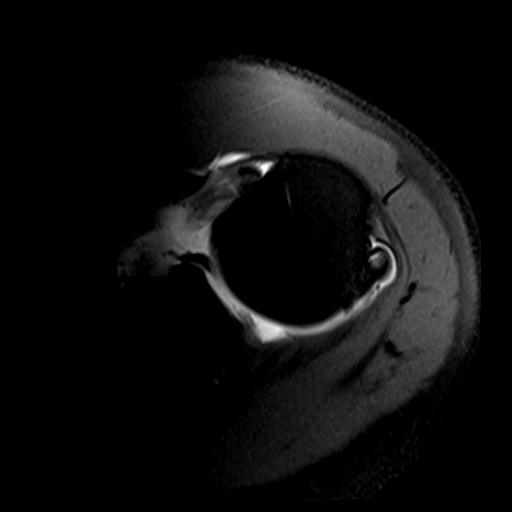
[im 16/22]
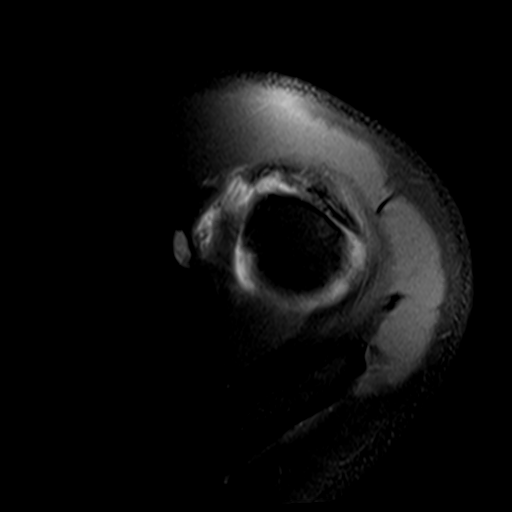
[im 19/22]
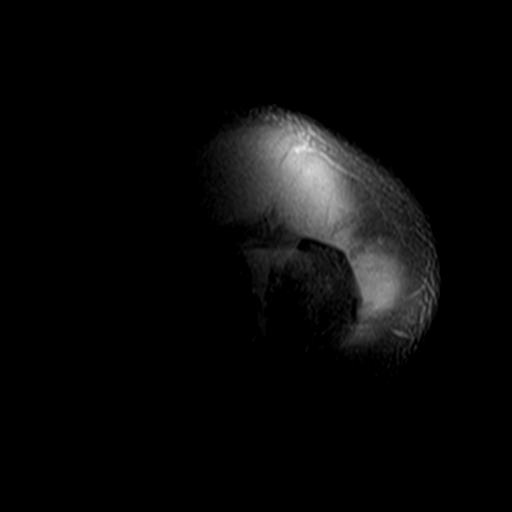
[im 22/22]
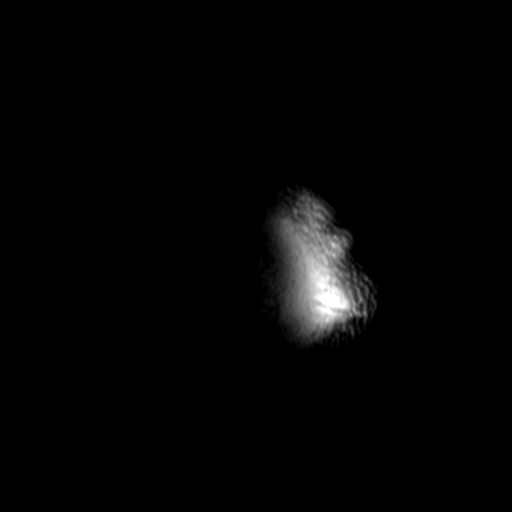

[Series 4: T2 fat-sat · coronal · 4.0mm · 0.55mm/px · 7 of 20 slices shown (1 of 2)]
[im 1/20]
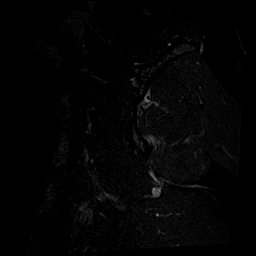
[im 4/20]
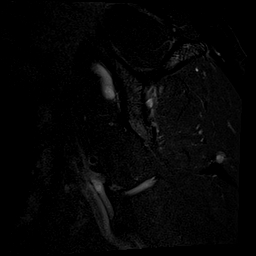
[im 7/20]
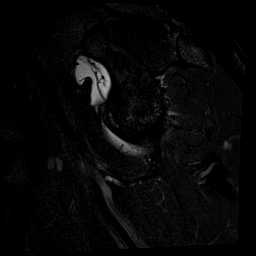
[im 10/20]
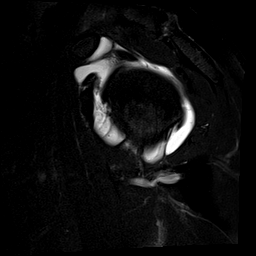
[im 13/20]
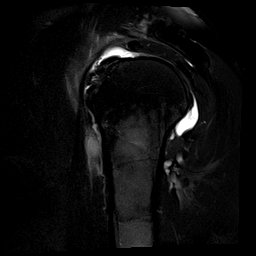
[im 16/20]
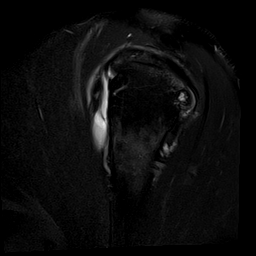
[im 20/20]
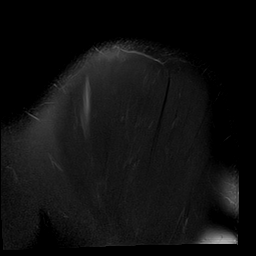

[Series 7: T1 · oblique · 4.0mm · 0.55mm/px · 6 of 18 slices shown]
[im 1/18]
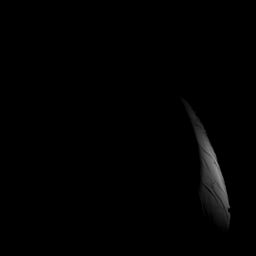
[im 4/18]
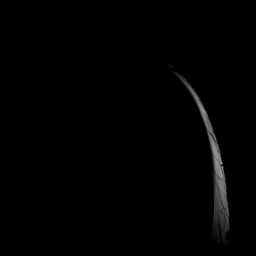
[im 7/18]
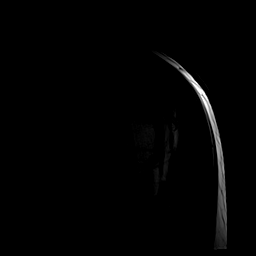
[im 11/18]
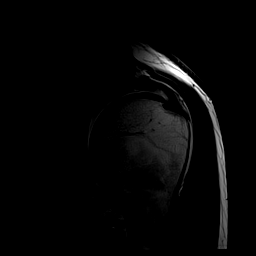
[im 14/18]
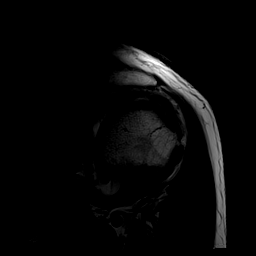
[im 18/18]
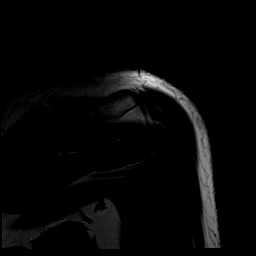

[Series 12: T2 fat-sat · oblique · 4.0mm · 0.56mm/px · 6 of 18 slices shown (2 of 2)]
[im 1/18]
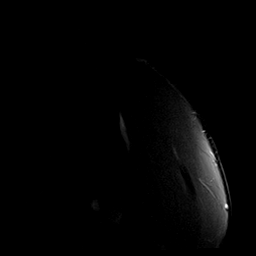
[im 4/18]
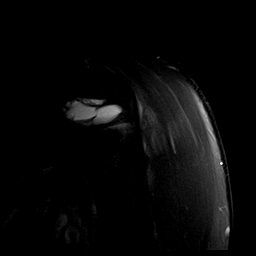
[im 7/18]
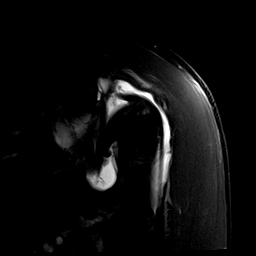
[im 11/18]
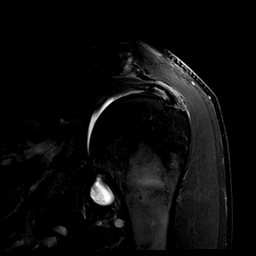
[im 14/18]
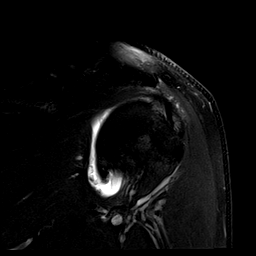
[im 18/18]
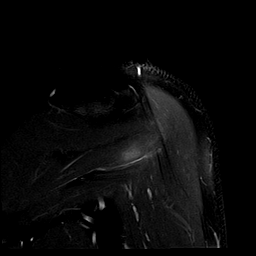

[Series 13: T1 fat-sat · sagittal · 4.0mm · 0.59mm/px · 6 of 18 slices shown (2 of 3)]
[im 1/18]
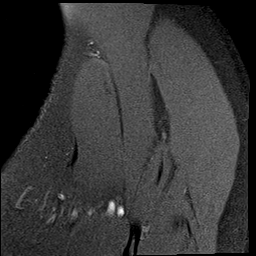
[im 4/18]
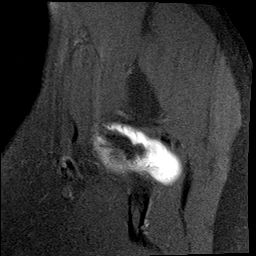
[im 7/18]
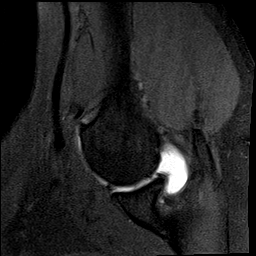
[im 11/18]
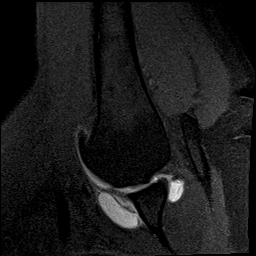
[im 14/18]
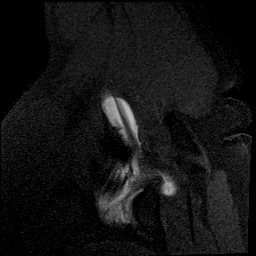
[im 18/18]
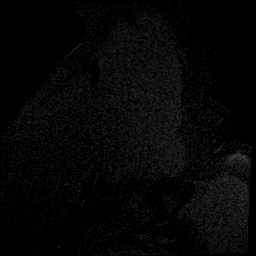

[Series 14: T1 fat-sat · oblique · 4.0mm · 0.55mm/px · 6 of 18 slices shown (3 of 3)]
[im 1/18]
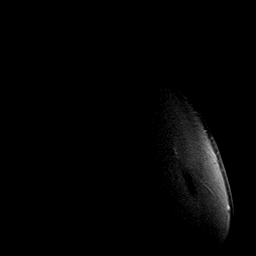
[im 4/18]
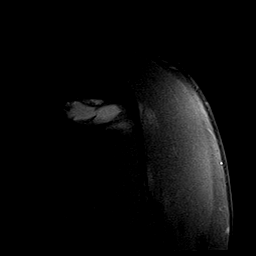
[im 7/18]
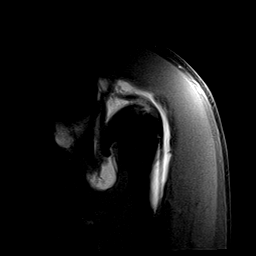
[im 11/18]
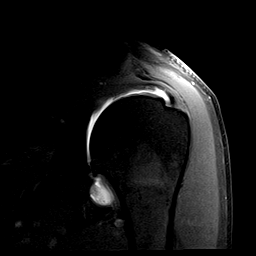
[im 14/18]
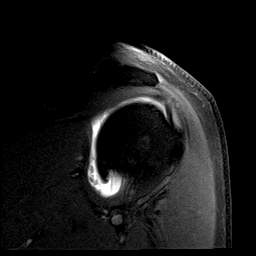
[im 18/18]
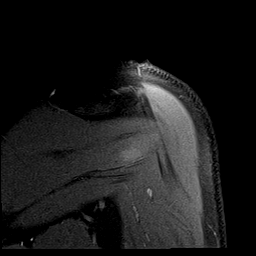

[40 of 40 positions shown; findings below may reference images not displayed]

FINDINGS: Rotator cuff: Severe supraspinatus tendinosis with high-grade
partial-thickness articular surface tear of the anterior to mid
tendon insertion measuring approximately 13 mm in AP dimension. Tear
involves between 50-75% of the tendon depth. There is also
high-grade articular surface tearing just proximal to the tendon
insertion. No full-thickness tear. Severe subscapularis tendinosis
with high-grade insertional tearing. Moderate infraspinatus
tendinosis with low-grade bursal sided fraying/irregularity. Intact
teres minor.

Muscles: No rotator cuff muscle atrophy or fatty infiltration.

Biceps long head: Intra-articular biceps tendinosis.

Acromioclavicular Joint: Mild-moderate arthropathy of the AC joint.
Small volume subacromial-subdeltoid bursal fluid. No contrast
extends into the bursal space.

Glenohumeral Joint: Well distended with injected contrast. Internal
heterogeneity suggests a degree of synovitis. There is mild-moderate
diffuse chondral thinning. Marginal humeral head osteophytes.

Labrum: Diffuse labral degeneration.

Bones: Glenohumeral joint space narrowing with marginal osteophyte
formation. No acute fracture. No dislocation. No suspicious marrow
replacing bone lesion.

Other: None.
IMPRESSION: 1. Severe rotator cuff tendinosis with high-grade partial-thickness
tears of the supraspinatus and subscapularis tendons, as above.
2. Intra-articular biceps tendinosis.
3. Mild subacromial-subdeltoid bursitis.
4. Moderate glenohumeral and acromioclavicular osteoarthritis.

## 2021-02-14 IMAGING — XA DG FLUORO GUIDE NDL PLC/BX
2 series · 2 of 2 positions shown · IV contrast (multihance)
Comparison: none

CLINICAL DATA: Left shoulder pain

EXAM:
LEFT SHOULDER INJECTION UNDER FLUOROSCOPY
TECHNIQUE: An appropriate skin entrance site was determined. The site was
marked, prepped with Betadine, draped in the usual sterile fashion,
and infiltrated locally with Lidocaine. 22 gauge spinal needle was
advanced to the inferior medial margin of the humeral head under
intermittent fluoroscopy. 16 ml of mixture of 0.1 ml Multihance 20
ml of Omnipaque 300 was then used to opacify the left shoulder
joint.
FLUOROSCOPY TIME:  Fluoroscopy Time: 1 minute 13 seconds
Radiation Exposure Index (if provided by the fluoroscopic device):
54.54 mGy meters squared

[Series 1: ortho standard · 1 of 1 slices shown (1 of 2)]
[im 1/1]
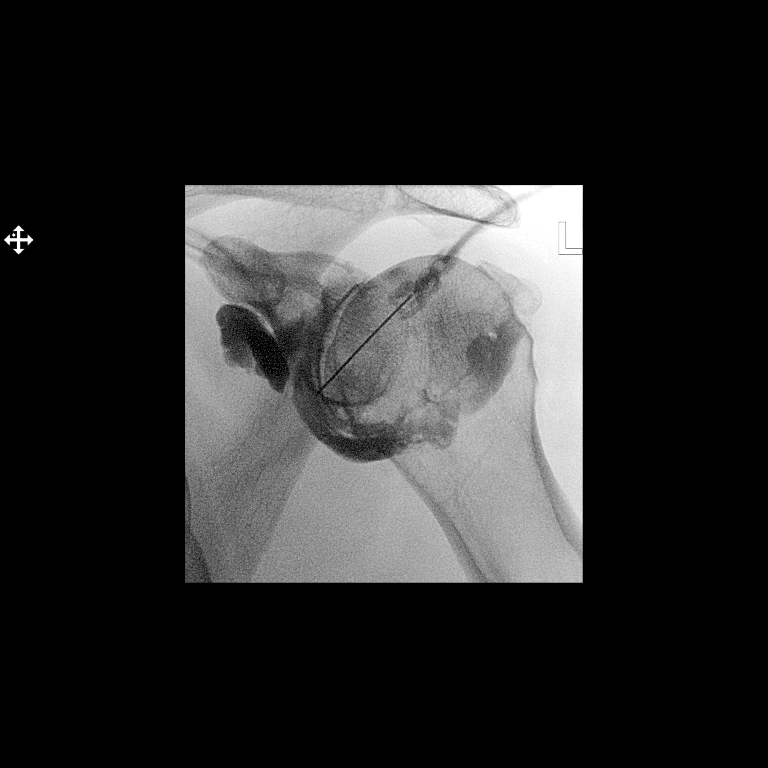

[Series 2: ortho standard · 1 of 1 slices shown (2 of 2)]
[im 1/1]
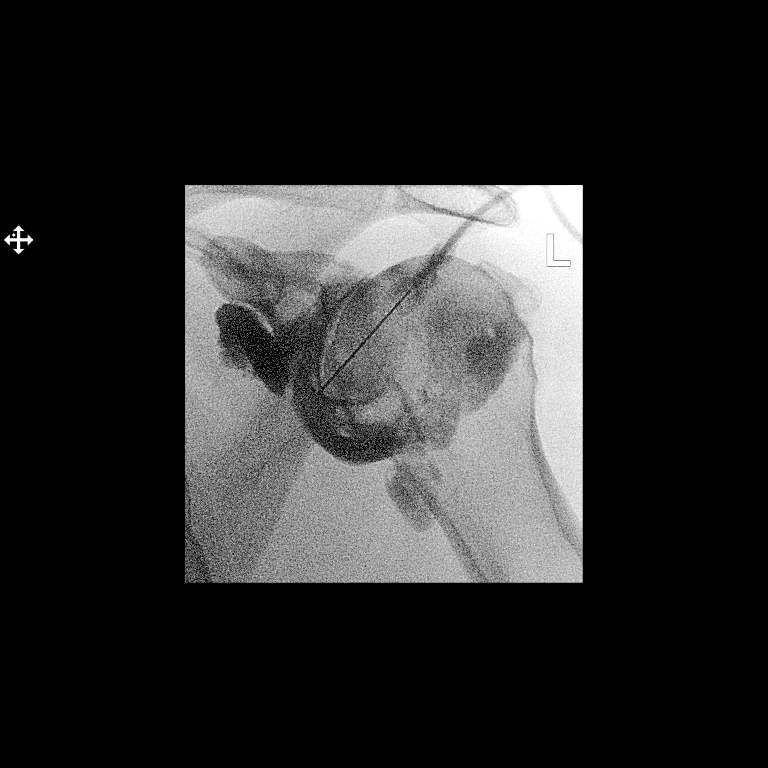

[2 of 2 positions shown; findings below may reference images not displayed]

IMPRESSION: Technically successful left shoulder injection for MRI.

Procedure performed by COTTO PA

## 2021-02-14 MED ORDER — IOPAMIDOL (ISOVUE-M 200) INJECTION 41%
15.0000 mL | Freq: Once | INTRAMUSCULAR | Status: AC
  Administered 2021-02-14: 15 mL via INTRA_ARTICULAR

## 2021-02-15 ENCOUNTER — Encounter: Payer: Self-pay | Admitting: Family Medicine

## 2021-02-15 ENCOUNTER — Other Ambulatory Visit: Payer: Self-pay

## 2021-02-15 DIAGNOSIS — M25512 Pain in left shoulder: Secondary | ICD-10-CM

## 2021-02-15 DIAGNOSIS — G8929 Other chronic pain: Secondary | ICD-10-CM

## 2021-03-10 ENCOUNTER — Encounter: Payer: Medicare Other | Admitting: Family Medicine

## 2021-03-14 ENCOUNTER — Other Ambulatory Visit: Payer: Self-pay | Admitting: Family Medicine

## 2021-04-04 ENCOUNTER — Other Ambulatory Visit: Payer: Self-pay | Admitting: Urology

## 2021-04-10 ENCOUNTER — Other Ambulatory Visit: Payer: Medicare Other

## 2021-04-10 ENCOUNTER — Other Ambulatory Visit: Payer: Self-pay

## 2021-04-10 DIAGNOSIS — E349 Endocrine disorder, unspecified: Secondary | ICD-10-CM

## 2021-04-10 DIAGNOSIS — N138 Other obstructive and reflux uropathy: Secondary | ICD-10-CM

## 2021-04-11 LAB — TESTOSTERONE: Testosterone: 360 ng/dL (ref 264–916)

## 2021-04-11 LAB — PSA: Prostate Specific Ag, Serum: 2 ng/mL (ref 0.0–4.0)

## 2021-04-11 LAB — HEMOGLOBIN AND HEMATOCRIT, BLOOD
Hematocrit: 45.7 % (ref 37.5–51.0)
Hemoglobin: 15.9 g/dL (ref 13.0–17.7)

## 2021-04-14 ENCOUNTER — Telehealth: Payer: Self-pay

## 2021-04-14 NOTE — Telephone Encounter (Signed)
Copied from [place] [id number]. Topic: General - Other >> Apr 14, 2021 10:25 AM Yvette Rack wrote: Reason for CRM: Pt wife Cindi stated pt would like to establish care with Dr. Jacinto Reap upon Simona Huh leaving.

## 2021-04-14 NOTE — Telephone Encounter (Signed)
Patient advised that Dr. Brita Romp is not taking new patient at the moment. Patient will keep appt in November.

## 2021-04-26 ENCOUNTER — Other Ambulatory Visit: Payer: Self-pay | Admitting: Family Medicine

## 2021-04-26 DIAGNOSIS — I10 Essential (primary) hypertension: Secondary | ICD-10-CM

## 2021-04-26 NOTE — Telephone Encounter (Signed)
Requested medications are due for refill today.  yes  Requested medications are on the active medications list.  yes  Last refill. 01/26/2021  Future visit scheduled.   yes  Notes to clinic.  Pt is more than 3 months overdue for OV.

## 2021-05-05 ENCOUNTER — Encounter: Payer: Medicare Other | Admitting: Family Medicine

## 2021-05-17 ENCOUNTER — Other Ambulatory Visit: Payer: Self-pay | Admitting: Family Medicine

## 2021-05-17 ENCOUNTER — Telehealth: Payer: Self-pay | Admitting: Family Medicine

## 2021-05-17 ENCOUNTER — Other Ambulatory Visit: Payer: Self-pay

## 2021-05-17 DIAGNOSIS — I10 Essential (primary) hypertension: Secondary | ICD-10-CM

## 2021-05-17 MED ORDER — LISINOPRIL-HYDROCHLOROTHIAZIDE 20-25 MG PO TABS: 1.0000 | ORAL_TABLET | Freq: Every day | ORAL | 0 refills | Status: DC

## 2021-05-17 NOTE — Telephone Encounter (Signed)
[facility] faxed refill request for the following medications:  lisinopril-hydrochlorothiazide (ZESTORETIC) 20-25 MG tablet   Please advise.  

## 2021-05-22 ENCOUNTER — Telehealth: Payer: Medicare Other | Admitting: Physician Assistant

## 2021-05-22 DIAGNOSIS — J208 Acute bronchitis due to other specified organisms: Secondary | ICD-10-CM | POA: Diagnosis not present

## 2021-05-22 DIAGNOSIS — B9689 Other specified bacterial agents as the cause of diseases classified elsewhere: Secondary | ICD-10-CM | POA: Diagnosis not present

## 2021-05-22 MED ORDER — PREDNISONE 20 MG PO TABS: 20.0000 mg | ORAL_TABLET | Freq: Every day | ORAL | 0 refills | Status: DC

## 2021-05-22 MED ORDER — AZITHROMYCIN 250 MG PO TABS: ORAL_TABLET | ORAL | 0 refills | Status: DC

## 2021-05-22 MED ORDER — BENZONATATE 100 MG PO CAPS: 100.0000 mg | ORAL_CAPSULE | Freq: Three times a day (TID) | ORAL | 0 refills | Status: DC | PRN

## 2021-05-22 NOTE — Patient Instructions (Signed)
[name], thank you for joining Mar Daring, PA-C for today's virtual visit.  While this provider is not your primary care provider (PCP), if your PCP is located in our provider database this encounter information will be shared with them immediately following your visit.  Consent: (Patient) Curtis [name] provided verbal consent for this virtual visit at the beginning of the encounter.  Current Medications:  Current Outpatient Medications:    azithromycin (ZITHROMAX) 250 MG tablet, Take 2 tablets PO on day one, and one tablet PO daily thereafter until completed., Disp: 6 tablet, Rfl: 0   benzonatate (TESSALON) 100 MG capsule, Take 1 capsule (100 mg total) by mouth 3 (three) times daily as needed., Disp: 30 capsule, Rfl: 0   predniSONE (DELTASONE) 20 MG tablet, Take 1 tablet (20 mg total) by mouth daily with breakfast., Disp: 5 tablet, Rfl: 0   allopurinol (ZYLOPRIM) 300 MG tablet, TAKE 1 TABLET(300 MG) BY MOUTH DAILY, Disp: 30 tablet, Rfl: 3   Apoaequorin (PREVAGEN PO), Take 1 tablet by mouth daily., Disp: , Rfl:    Hypodermic Needles-Disposable (SAFETY-GARD NEEDLE 18G) MISC, 0.5 mLs by Does not apply route every 14 (fourteen) days., Disp: 30 each, Rfl: 5   lisinopril-hydrochlorothiazide (ZESTORETIC) 20-25 MG tablet, TAKE 1 TABLET BY MOUTH DAILY, Disp: 90 tablet, Rfl: 3   Multiple Vitamin (MULTIVITAMIN) tablet, Take 1 tablet by mouth daily., Disp: , Rfl:    NEEDLE, DISP, 21 G (BD SAFETYGLIDE SHIELDED NEEDLE) 21G X 1-1/2" MISC, 0.5 mLs by Does not apply route every 14 (fourteen) days., Disp: 30 each, Rfl: 5   NON FORMULARY, Gout Relief 1/2 oz daily, Disp: , Rfl:    pravastatin (PRAVACHOL) 40 MG tablet, Take 1 tablet (40 mg total) by mouth daily. Please schedule office visit before any future refills., Disp: 90 tablet, Rfl: 0   sildenafil (REVATIO) 20 MG tablet, TAKE 1 TABLET BY MOUTH AS NEEDED, TAKE 3-5 TABLETS AS NEEDED PROIR TO INTERCOUSE, Disp: 30 tablet, Rfl: 11    Syringe, Disposable, 3 ML MISC, 0.5 mLs by Does not apply route every 14 (fourteen) days., Disp: 30 each, Rfl: 5   testosterone cypionate (DEPOTESTOSTERONE CYPIONATE) 200 MG/ML injection, Inject 0.5 cc every fourteen days, Disp: 10 mL, Rfl: 0   Medications ordered in this encounter:  Meds ordered this encounter  Medications   azithromycin (ZITHROMAX) 250 MG tablet    Sig: Take 2 tablets PO on day one, and one tablet PO daily thereafter until completed.    Dispense:  6 tablet    Refill:  0    Order Specific Question:   Supervising Provider    Answer:   MILLER, BRIAN [[id number]]   benzonatate (TESSALON) 100 MG capsule    Sig: Take 1 capsule (100 mg total) by mouth 3 (three) times daily as needed.    Dispense:  30 capsule    Refill:  0    Order Specific Question:   Supervising Provider    Answer:   MILLER, BRIAN [[id number]]   predniSONE (DELTASONE) 20 MG tablet    Sig: Take 1 tablet (20 mg total) by mouth daily with breakfast.    Dispense:  5 tablet    Refill:  0    Order Specific Question:   Supervising Provider    Answer:   Sabra Heck, BRIAN [[id number]]     *If you need refills on other medications prior to your next appointment, please contact your pharmacy*  Follow-Up: Call back or seek an in-person evaluation if the symptoms  worsen or if the condition fails to improve as anticipated.  Other Instructions Acute Bronchitis, Adult Acute bronchitis is when air tubes in the lungs (bronchi) suddenly get swollen. The condition can make it hard for you to breathe. In adults, acute bronchitis usually goes away within 2 weeks. A cough caused by bronchitis may last up to 3 weeks. Smoking, allergies, and asthma can make the condition worse. What are the causes? This condition is caused by: Cold and flu viruses. The most common cause of this condition is the virus that causes the common cold. Bacteria. Substances that irritate the lungs, including: Smoke from cigarettes and other types of tobacco. Dust and  pollen. Fumes from chemicals, gases, or burned fuel. Other materials that pollute indoor or outdoor air. Close contact with someone who has acute bronchitis. What increases the risk? The following factors may make you more likely to develop this condition: A weak body's defense system. This is also called the immune system. Any condition that affects your lungs and breathing, such as asthma. What are the signs or symptoms? Symptoms of this condition include: A cough. Coughing up clear, yellow, or green mucus. Wheezing. Having too much mucus in your lungs (chest congestion). Shortness of breath. A fever. Chills. Body aches. A sore throat. How is this treated? Acute bronchitis may go away over time without treatment. Your doctor may recommend: Drinking more fluids. Using a device that gets medicine into your lungs (inhaler). Using a vaporizer or a humidifier. These are machines that add water or moisture to the air. This helps with coughing and poor breathing. Taking a medicine for fever. Taking a medicine that thins mucus and clears congestion. Taking a medicine that prevents or stops coughing. Follow these instructions at home: Activity Get a lot of rest. Return to your normal activities as told by your doctor. Ask your doctor what activities are safe for you. Lifestyle  Drink enough fluid to keep your pee (urine) pale yellow. Do not drink alcohol. Do not use any products that contain nicotine or tobacco, such as cigarettes, e-cigarettes, and chewing tobacco. If you need help quitting, ask your doctor. Be aware that: Your bronchitis will get worse if you smoke or breathe in other people's smoke (secondhand smoke). Your lungs will heal faster if you quit smoking. General instructions Take over-the-counter and prescription medicines only as told by your doctor. Use an inhaler, cool mist vaporizer, or humidifier as told by your doctor. Rinse your mouth often with salt water. To  make salt water, dissolve -1 tsp (3-6 g) of salt in 1 cup (237 mL) of warm water. Take two teaspoons of honey at bedtime. This helps lessen your coughing at night. Keep all follow-up visits as told by your doctor. This is important. How is this prevented? To lower your risk of getting this condition again: Wash your hands often with soap and water. If you cannot use soap and water, use hand sanitizer. Avoid contact with people who have cold symptoms. Try not to touch your mouth, nose, or eyes with your hands. Make sure to get the flu shot every year. Contact a doctor if: Your symptoms do not get better in 2 weeks. You vomit more than once or twice. You have symptoms of loss of fluid from your body (dehydration). These include: Dark pee. Dry skin or eyes. Increased thirst. Headaches. Confusion. Muscle cramps. Get help right away if: You cough up blood. You have chest pain. You have very bad shortness of breath. You become  dehydrated. You faint or keep feeling like you are going to faint. You have a very bad headache. Your fever or chills get worse. These symptoms may be an emergency. Get help right away. Call your local emergency services (911 in the [place] Do not wait to see if the symptoms will go away. Do not drive yourself to the hospital. Summary Acute bronchitis is when air tubes in the lungs (bronchi) suddenly get swollen. In adults, acute bronchitis usually goes away within 2 weeks. Take over-the-counter and prescription medicines only as told by your doctor. Drink enough fluid to keep your pee (urine) pale yellow. Contact a doctor if your symptoms do not improve after 2 weeks of treatment. Get help right away if you cough up blood, faint, or have chest pain or shortness of breath. This information is not intended to replace advice given to you by your health care provider. Make sure you discuss any questions you have with your health care provider. Document Revised:  06/15/2020 Document Reviewed: 02/06/2019 Elsevier Patient Education  2022 [employer].    If you have been instructed to have an in-person evaluation today at a local Urgent Care facility, please use the link below. It will take you to a list of all of our available [facility], including address, phone number and hours of operation. Please do not delay care.  [facility]  If you or a family member do not have a primary care provider, use the link below to schedule a visit and establish care. When you choose a [facility] primary care physician or advanced practice provider, you gain a long-term partner in health. Find a Primary Care Provider  Learn more about 's in-office and virtual care options: [facility]

## 2021-05-22 NOTE — Progress Notes (Signed)
Virtual Visit Consent   Curtis [name], you are scheduled for a virtual visit with a [facility] provider today.     Just as with appointments in the office, your consent must be obtained to participate.  Your consent will be active for this visit and any virtual visit you may have with one of our providers in the next 365 days.     If you have a MyChart account, a copy of this consent can be sent to you electronically.  All virtual visits are billed to your insurance company just like a traditional visit in the office.    As this is a virtual visit, video technology does not allow for your provider to perform a traditional examination.  This may limit your provider's ability to fully assess your condition.  If your provider identifies any concerns that need to be evaluated in person or the need to arrange testing (such as labs, EKG, etc.), we will make arrangements to do so.     Although advances in technology are sophisticated, we cannot ensure that it will always work on either your end or our end.  If the connection with a video visit is poor, the visit may have to be switched to a telephone visit.  With either a video or telephone visit, we are not always able to ensure that we have a secure connection.     I need to obtain your verbal consent now.   Are you willing to proceed with your visit today?    Curtis [name] has provided verbal consent on 05/22/2021 for a virtual visit (video or telephone).   Mar Daring, PA-C   Date: 05/22/2021 3:43 PM   Virtual Visit via Video Note   I, Mar Daring, connected with  Curtis [name]  ([id number], 09-Sep-1950) on 05/22/21 at  3:30 PM EDT by a video-enabled telemedicine application and verified that I am speaking with the correct person using two identifiers.  Location: Patient: Virtual Visit Location Patient: Home Provider: Virtual Visit Location Provider: Home Office   I discussed the limitations of  evaluation and management by telemedicine and the availability of in person appointments. The patient expressed understanding and agreed to proceed.    History of Present Illness: Curtis [name] is a 70 y.o. who identifies as a male who was assigned male at birth, and is being seen today for cough.  HPI: Cough This is a new problem. The current episode started in the past 7 days. The problem has been gradually worsening. The problem occurs every few minutes. The cough is Productive of sputum. Associated symptoms include headaches (mild), nasal congestion, postnasal drip, rhinorrhea, a sore throat, sweats and wheezing. Pertinent negatives include no chills, ear congestion, ear pain, fever or myalgias. The symptoms are aggravated by lying down. Treatments tried: alka seltzer, thera flu, robitussin. The treatment provided no relief. His past medical history is significant for bronchitis. There is no history of asthma or pneumonia.     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    Allergies: No Known Allergies Medications:  Current Outpatient Medications:    azithromycin (ZITHROMAX) 250  MG tablet, Take 2 tablets PO on day one, and one tablet PO daily thereafter until completed., Disp: 6 tablet, Rfl: 0   benzonatate (TESSALON) 100 MG capsule, Take 1 capsule (100 mg total) by mouth 3 (three) times daily as needed., Disp: 30 capsule, Rfl: 0   predniSONE (DELTASONE) 20 MG tablet, Take 1 tablet (20 mg total) by mouth daily with breakfast., Disp: 5 tablet, Rfl: 0   allopurinol (ZYLOPRIM) 300 MG tablet, TAKE 1  TABLET(300 MG) BY MOUTH DAILY, Disp: 30 tablet, Rfl: 3   Apoaequorin (PREVAGEN PO), Take 1 tablet by mouth daily., Disp: , Rfl:    Hypodermic Needles-Disposable (SAFETY-GARD NEEDLE 18G) MISC, 0.5 mLs by Does not apply route every 14 (fourteen) days., Disp: 30 each, Rfl: 5   lisinopril-hydrochlorothiazide (ZESTORETIC) 20-25 MG tablet, TAKE 1 TABLET BY MOUTH DAILY, Disp: 90 tablet, Rfl: 3   Multiple Vitamin (MULTIVITAMIN) tablet, Take 1 tablet by mouth daily., Disp: , Rfl:    NEEDLE, DISP, 21 G (BD SAFETYGLIDE SHIELDED NEEDLE) 21G X 1-1/2" MISC, 0.5 mLs by Does not apply route every 14 (fourteen) days., Disp: 30 each, Rfl: 5   NON FORMULARY, Gout Relief 1/2 oz daily, Disp: , Rfl:    pravastatin (PRAVACHOL) 40 MG tablet, Take 1 tablet (40 mg total) by mouth daily. Please schedule office visit before any future refills., Disp: 90 tablet, Rfl: 0   sildenafil (REVATIO) 20 MG tablet, TAKE 1 TABLET BY MOUTH AS NEEDED, TAKE 3-5 TABLETS AS NEEDED PROIR TO INTERCOUSE, Disp: 30 tablet, Rfl: 11   Syringe, Disposable, 3 ML MISC, 0.5 mLs by Does not apply route every 14 (fourteen) days., Disp: 30 each, Rfl: 5   testosterone cypionate (DEPOTESTOSTERONE CYPIONATE) 200 MG/ML injection, Inject 0.5 cc every fourteen days, Disp: 10 mL, Rfl: 0  Observations/Objective: Patient is well-developed, well-nourished in no acute distress.  Resting comfortably at home.  Head is normocephalic, atraumatic.  No labored breathing.  Speech is clear and coherent with logical content.  Patient is alert and oriented at baseline.    Assessment and Plan: 1. Acute bacterial bronchitis - azithromycin (ZITHROMAX) 250 MG tablet; Take 2 tablets PO on day one, and one tablet PO daily thereafter until completed.  Dispense: 6 tablet; Refill: 0 - benzonatate (TESSALON) 100 MG capsule; Take 1 capsule (100 mg total) by mouth 3 (three) times daily as needed.  Dispense: 30 capsule; Refill: 0 - predniSONE (DELTASONE) 20 MG tablet; Take 1 tablet  (20 mg total) by mouth daily with breakfast.  Dispense: 5 tablet; Refill: 0  - Worsening.  - Will treat with zpak, prednisone and tessalon perles.  - Push fluids.  - Rest.  - Call or seek in person evaluation if worsening.   Follow Up Instructions: I discussed the assessment and treatment plan with the patient. The patient was provided an opportunity to ask questions and all were answered. The patient agreed with the plan and demonstrated an understanding of the instructions.  A copy of instructions were sent to the patient via MyChart unless otherwise noted below.    The patient was advised to call back or seek an in-person evaluation if the symptoms worsen or if the condition fails to improve as anticipated.  Time:  I spent 8 minutes with the patient via telehealth technology discussing the above problems/concerns.    Mar Daring, PA-C

## 2021-05-25 ENCOUNTER — Telehealth: Payer: Medicare Other | Admitting: Physician Assistant

## 2021-05-25 DIAGNOSIS — J40 Bronchitis, not specified as acute or chronic: Secondary | ICD-10-CM | POA: Diagnosis not present

## 2021-05-25 MED ORDER — ALBUTEROL SULFATE HFA 108 (90 BASE) MCG/ACT IN AERS: 2.0000 | INHALATION_SPRAY | Freq: Four times a day (QID) | RESPIRATORY_TRACT | 0 refills | Status: DC | PRN

## 2021-05-25 MED ORDER — DOXYCYCLINE HYCLATE 100 MG PO TABS: 100.0000 mg | ORAL_TABLET | Freq: Two times a day (BID) | ORAL | 0 refills | Status: DC

## 2021-05-25 NOTE — Progress Notes (Signed)
Virtual Visit Consent   Curtis [name], you are scheduled for a virtual visit with a [facility] provider today.     Just as with appointments in the office, your consent must be obtained to participate.  Your consent will be active for this visit and any virtual visit you may have with one of our providers in the next 365 days.     If you have a MyChart account, a copy of this consent can be sent to you electronically.  All virtual visits are billed to your insurance company just like a traditional visit in the office.    As this is a virtual visit, video technology does not allow for your provider to perform a traditional examination.  This may limit your provider's ability to fully assess your condition.  If your provider identifies any concerns that need to be evaluated in person or the need to arrange testing (such as labs, EKG, etc.), we will make arrangements to do so.     Although advances in technology are sophisticated, we cannot ensure that it will always work on either your end or our end.  If the connection with a video visit is poor, the visit may have to be switched to a telephone visit.  With either a video or telephone visit, we are not always able to ensure that we have a secure connection.     I need to obtain your verbal consent now.   Are you willing to proceed with your visit today?    Curtis [name] has provided verbal consent on 05/25/2021 for a virtual visit (video or telephone).   Mar Daring, PA-C   Date: 05/25/2021 11:45 AM   Virtual Visit via Video Note   I, Mar Daring, connected with  Curtis [name]  ([id number], [birth year]) on 05/25/21 at 11:45 AM EDT by a video-enabled telemedicine application and verified that I am speaking with the correct person using two identifiers.  Location: Patient: Virtual Visit Location Patient: Home Provider: Virtual Visit Location Provider: Home Office   I discussed the limitations of  evaluation and management by telemedicine and the availability of in person appointments. The patient expressed understanding and agreed to proceed.    History of Present Illness: Curtis [name] is a 70 y.o. who identifies as a male who was assigned male at birth, and is being seen today for continued URI symptoms. He was seen via video visit on 05/22/21 and was started on Zpak, prednisone and tessalon perles. He reports overall he has improved about 80%, cough has resolved, but still having some chest congestion, tightness and wheezing.   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    Allergies: No Known Allergies Medications:  Current Outpatient Medications:    albuterol (VENTOLIN HFA) 108 (90 Base) MCG/ACT inhaler, Inhale 2 puffs into the lungs every 6 (six) hours as needed for wheezing or shortness of breath., Disp: 8 g, Rfl: 0   doxycycline (VIBRA-TABS) 100 MG tablet, Take 1 tablet (100 mg total) by mouth 2 (two) times daily., Disp: 14 tablet, Rfl: 0   allopurinol (ZYLOPRIM) 300 MG tablet, TAKE 1 TABLET(300  MG) BY MOUTH DAILY, Disp: 30 tablet, Rfl: 3   azithromycin (ZITHROMAX) 250 MG tablet, Take 2 tablets PO on day one, and one tablet PO daily thereafter until completed., Disp: 6 tablet, Rfl: 0   benzonatate (TESSALON) 100 MG capsule, Take 1 capsule (100 mg total) by mouth 3 (three) times daily as needed., Disp: 30 capsule, Rfl: 0   Hypodermic Needles-Disposable (SAFETY-GARD NEEDLE 18G) MISC, 0.5 mLs by Does not apply route every 14 (fourteen) days., Disp: 30 each, Rfl: 5    lisinopril-hydrochlorothiazide (ZESTORETIC) 20-25 MG tablet, TAKE 1 TABLET BY MOUTH DAILY, Disp: 90 tablet, Rfl: 3   Multiple Vitamin (MULTIVITAMIN) tablet, Take 1 tablet by mouth daily., Disp: , Rfl:    NEEDLE, DISP, 21 G (BD SAFETYGLIDE SHIELDED NEEDLE) 21G X 1-1/2" MISC, 0.5 mLs by Does not apply route every 14 (fourteen) days., Disp: 30 each, Rfl: 5   NON FORMULARY, Gout Relief 1/2 oz daily, Disp: , Rfl:    pravastatin (PRAVACHOL) 40 MG tablet, Take 1 tablet (40 mg total) by mouth daily. Please schedule office visit before any future refills., Disp: 90 tablet, Rfl: 0   predniSONE (DELTASONE) 20 MG tablet, Take 1 tablet (20 mg total) by mouth daily with breakfast., Disp: 5 tablet, Rfl: 0   sildenafil (REVATIO) 20 MG tablet, TAKE 1 TABLET BY MOUTH AS NEEDED, TAKE 3-5 TABLETS AS NEEDED PROIR TO INTERCOUSE, Disp: 30 tablet, Rfl: 11   Syringe, Disposable, 3 ML MISC, 0.5 mLs by Does not apply route every 14 (fourteen) days., Disp: 30 each, Rfl: 5   testosterone cypionate (DEPOTESTOSTERONE CYPIONATE) 200 MG/ML injection, Inject 0.5 cc every fourteen days, Disp: 10 mL, Rfl: 0  Observations/Objective: Patient is well-developed, well-nourished in no acute distress.  Resting comfortably at home.  Head is normocephalic, atraumatic.  No labored breathing.  Speech is clear and coherent with logical content.  Patient is alert and oriented at baseline.    Assessment and Plan: 1. Bronchitis - albuterol (VENTOLIN HFA) 108 (90 Base) MCG/ACT inhaler; Inhale 2 puffs into the lungs every 6 (six) hours as needed for wheezing or shortness of breath.  Dispense: 8 g; Refill: 0 - doxycycline (VIBRA-TABS) 100 MG tablet; Take 1 tablet (100 mg total) by mouth 2 (two) times daily.  Dispense: 14 tablet; Refill: 0  - Finish Zpak (has one dose left) - Albuterol added for chest congestion and wheezing - Doxycycline provided; Advised to finish Zpak and start Albuterol first. If symptoms are improving completely no need to  start Doxycycline. If some residual symptoms remain, may start Doxycycline - Advised if symptoms persist or return after this treatment he MUST be seen in person for further evaluation, may require imaging to r/o pneumonia  Follow Up Instructions: I discussed the assessment and treatment plan with the patient. The patient was provided an opportunity to ask questions and all were answered. The patient agreed with the plan and demonstrated an understanding of the instructions.  A copy of instructions were sent to the patient via MyChart unless otherwise noted below.    The patient was advised to call back or seek an in-person evaluation if the symptoms worsen or if the condition fails to improve as anticipated.  Time:  I spent 10 minutes with the patient via telehealth technology discussing the above problems/concerns.    Mar Daring, PA-C

## 2021-05-25 NOTE — Patient Instructions (Signed)
Curtis [name], thank you for joining Mar Daring, PA-C for today's virtual visit.  While this provider is not your primary care provider (PCP), if your PCP is located in our provider database this encounter information will be shared with them immediately following your visit.  Consent: (Patient) Curtis [name] provided verbal consent for this virtual visit at the beginning of the encounter.  Current Medications:  Current Outpatient Medications:    albuterol (VENTOLIN HFA) 108 (90 Base) MCG/ACT inhaler, Inhale 2 puffs into the lungs every 6 (six) hours as needed for wheezing or shortness of breath., Disp: 8 g, Rfl: 0   doxycycline (VIBRA-TABS) 100 MG tablet, Take 1 tablet (100 mg total) by mouth 2 (two) times daily., Disp: 14 tablet, Rfl: 0   allopurinol (ZYLOPRIM) 300 MG tablet, TAKE 1 TABLET(300 MG) BY MOUTH DAILY, Disp: 30 tablet, Rfl: 3   azithromycin (ZITHROMAX) 250 MG tablet, Take 2 tablets PO on day one, and one tablet PO daily thereafter until completed., Disp: 6 tablet, Rfl: 0   benzonatate (TESSALON) 100 MG capsule, Take 1 capsule (100 mg total) by mouth 3 (three) times daily as needed., Disp: 30 capsule, Rfl: 0   Hypodermic Needles-Disposable (SAFETY-GARD NEEDLE 18G) MISC, 0.5 mLs by Does not apply route every 14 (fourteen) days., Disp: 30 each, Rfl: 5   lisinopril-hydrochlorothiazide (ZESTORETIC) 20-25 MG tablet, TAKE 1 TABLET BY MOUTH DAILY, Disp: 90 tablet, Rfl: 3   Multiple Vitamin (MULTIVITAMIN) tablet, Take 1 tablet by mouth daily., Disp: , Rfl:    NEEDLE, DISP, 21 G (BD SAFETYGLIDE SHIELDED NEEDLE) 21G X 1-1/2" MISC, 0.5 mLs by Does not apply route every 14 (fourteen) days., Disp: 30 each, Rfl: 5   NON FORMULARY, Gout Relief 1/2 oz daily, Disp: , Rfl:    pravastatin (PRAVACHOL) 40 MG tablet, Take 1 tablet (40 mg total) by mouth daily. Please schedule office visit before any future refills., Disp: 90 tablet, Rfl: 0   predniSONE (DELTASONE) 20 MG tablet, Take  1 tablet (20 mg total) by mouth daily with breakfast., Disp: 5 tablet, Rfl: 0   sildenafil (REVATIO) 20 MG tablet, TAKE 1 TABLET BY MOUTH AS NEEDED, TAKE 3-5 TABLETS AS NEEDED PROIR TO INTERCOUSE, Disp: 30 tablet, Rfl: 11   Syringe, Disposable, 3 ML MISC, 0.5 mLs by Does not apply route every 14 (fourteen) days., Disp: 30 each, Rfl: 5   testosterone cypionate (DEPOTESTOSTERONE CYPIONATE) 200 MG/ML injection, Inject 0.5 cc every fourteen days, Disp: 10 mL, Rfl: 0   Medications ordered in this encounter:  Meds ordered this encounter  Medications   albuterol (VENTOLIN HFA) 108 (90 Base) MCG/ACT inhaler    Sig: Inhale 2 puffs into the lungs every 6 (six) hours as needed for wheezing or shortness of breath.    Dispense:  8 g    Refill:  0    Order Specific Question:   Supervising Provider    Answer:   MILLER, BRIAN [[id number]]   doxycycline (VIBRA-TABS) 100 MG tablet    Sig: Take 1 tablet (100 mg total) by mouth 2 (two) times daily.    Dispense:  14 tablet    Refill:  0    Order Specific Question:   Supervising Provider    Answer:   Sabra Heck, BRIAN [[id number]]     *If you need refills on other medications prior to your next appointment, please contact your pharmacy*  Follow-Up: Call back or seek an in-person evaluation if the symptoms worsen or if the condition fails to improve  as anticipated.  Other Instructions Acute Bronchitis, Adult Acute bronchitis is when air tubes in the lungs (bronchi) suddenly get swollen. The condition can make it hard for you to breathe. In adults, acute bronchitis usually goes away within 2 weeks. A cough caused by bronchitis may last up to 3 weeks. Smoking, allergies, and asthma can make the condition worse. What are the causes? This condition is caused by: Cold and flu viruses. The most common cause of this condition is the virus that causes the common cold. Bacteria. Substances that irritate the lungs, including: Smoke from cigarettes and other types of  tobacco. Dust and pollen. Fumes from chemicals, gases, or burned fuel. Other materials that pollute indoor or outdoor air. Close contact with someone who has acute bronchitis. What increases the risk? The following factors may make you more likely to develop this condition: A weak body's defense system. This is also called the immune system. Any condition that affects your lungs and breathing, such as asthma. What are the signs or symptoms? Symptoms of this condition include: A cough. Coughing up clear, yellow, or green mucus. Wheezing. Having too much mucus in your lungs (chest congestion). Shortness of breath. A fever. Chills. Body aches. A sore throat. How is this treated? Acute bronchitis may go away over time without treatment. Your doctor may recommend: Drinking more fluids. Using a device that gets medicine into your lungs (inhaler). Using a vaporizer or a humidifier. These are machines that add water or moisture to the air. This helps with coughing and poor breathing. Taking a medicine for fever. Taking a medicine that thins mucus and clears congestion. Taking a medicine that prevents or stops coughing. Follow these instructions at home: Activity Get a lot of rest. Return to your normal activities as told by your doctor. Ask your doctor what activities are safe for you. Lifestyle  Drink enough fluid to keep your pee (urine) pale yellow. Do not drink alcohol. Do not use any products that contain nicotine or tobacco, such as cigarettes, e-cigarettes, and chewing tobacco. If you need help quitting, ask your doctor. Be aware that: Your bronchitis will get worse if you smoke or breathe in other people's smoke (secondhand smoke). Your lungs will heal faster if you quit smoking. General instructions Take over-the-counter and prescription medicines only as told by your doctor. Use an inhaler, cool mist vaporizer, or humidifier as told by your doctor. Rinse your mouth often  with salt water. To make salt water, dissolve -1 tsp (3-6 g) of salt in 1 cup (237 mL) of warm water. Take two teaspoons of honey at bedtime. This helps lessen your coughing at night. Keep all follow-up visits as told by your doctor. This is important. How is this prevented? To lower your risk of getting this condition again: Wash your hands often with soap and water. If you cannot use soap and water, use hand sanitizer. Avoid contact with people who have cold symptoms. Try not to touch your mouth, nose, or eyes with your hands. Make sure to get the flu shot every year. Contact a doctor if: Your symptoms do not get better in 2 weeks. You vomit more than once or twice. You have symptoms of loss of fluid from your body (dehydration). These include: Dark pee. Dry skin or eyes. Increased thirst. Headaches. Confusion. Muscle cramps. Get help right away if: You cough up blood. You have chest pain. You have very bad shortness of breath. You become dehydrated. You faint or keep feeling like you  are going to faint. You have a very bad headache. Your fever or chills get worse. These symptoms may be an emergency. Get help right away. Call your local emergency services (911 in the [place] Do not wait to see if the symptoms will go away. Do not drive yourself to the hospital. Summary Acute bronchitis is when air tubes in the lungs (bronchi) suddenly get swollen. In adults, acute bronchitis usually goes away within 2 weeks. Take over-the-counter and prescription medicines only as told by your doctor. Drink enough fluid to keep your pee (urine) pale yellow. Contact a doctor if your symptoms do not improve after 2 weeks of treatment. Get help right away if you cough up blood, faint, or have chest pain or shortness of breath. This information is not intended to replace advice given to you by your health care provider. Make sure you discuss any questions you have with your health care  provider. Document Revised: 06/15/2020 Document Reviewed: 02/06/2019 Elsevier Patient Education  2022 [employer].    If you have been instructed to have an in-person evaluation today at a local Urgent Care facility, please use the link below. It will take you to a list of all of our available [facility], including address, phone number and hours of operation. Please do not delay care.  [facility]  If you or a family member do not have a primary care provider, use the link below to schedule a visit and establish care. When you choose a [facility] primary care physician or advanced practice provider, you gain a long-term partner in health. Find a Primary Care Provider  Learn more about [facility]'s in-office and virtual care options: [facility]

## 2021-06-05 ENCOUNTER — Encounter: Payer: Self-pay | Admitting: Family Medicine

## 2021-06-05 ENCOUNTER — Ambulatory Visit (INDEPENDENT_AMBULATORY_CARE_PROVIDER_SITE_OTHER): Payer: Medicare Other | Admitting: Family Medicine

## 2021-06-05 ENCOUNTER — Other Ambulatory Visit: Payer: Self-pay

## 2021-06-05 VITALS — BP 148/59 | HR 74 | Temp 98.1°F | Ht 69.0 in | Wt 201.9 lb

## 2021-06-05 DIAGNOSIS — E782 Mixed hyperlipidemia: Secondary | ICD-10-CM | POA: Diagnosis not present

## 2021-06-05 DIAGNOSIS — M1A9XX1 Chronic gout, unspecified, with tophus (tophi): Secondary | ICD-10-CM | POA: Diagnosis not present

## 2021-06-05 DIAGNOSIS — I10 Essential (primary) hypertension: Secondary | ICD-10-CM | POA: Insufficient documentation

## 2021-06-05 MED ORDER — LISINOPRIL-HYDROCHLOROTHIAZIDE 20-25 MG PO TABS: 1.0000 | ORAL_TABLET | Freq: Every day | ORAL | 3 refills | Status: DC

## 2021-06-05 MED ORDER — PRAVASTATIN SODIUM 40 MG PO TABS: 40.0000 mg | ORAL_TABLET | Freq: Every day | ORAL | 3 refills | Status: DC

## 2021-06-05 MED ORDER — ALLOPURINOL 300 MG PO TABS: 300.0000 mg | ORAL_TABLET | Freq: Every day | ORAL | 3 refills | Status: DC

## 2021-06-05 NOTE — Progress Notes (Signed)
Complete physical exam- had as private clinic; will upload results    Patient: Curtis [name]   DOB: May 13, 1951   70 y.o. Male  MRN: [id number] Visit Date: 06/05/2021  Today's healthcare provider: Gwyneth Sprout, FNP    Subjective    Curtis [name] is a 70 y.o. male who presents today for followup of a non-[facility] complete physical exam.  He reports consuming a general diet.  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 He reports sleeping well. He does have additional problems to discuss today.  HPI  -Last colonoscopy 05/13/17 -Wants to know whats the plan now that Simona Huh is gone -Refused flu  Past Medical History:  Diagnosis Date   Arthritis    Gout    HTN (hypertension)    Hx of dysplastic nevus 12/14/2019   L lower flank Moderate atypia   Hyperlipidemia    Hypogonadism in male    Low back pain    Morton neuroma, left    Past Surgical History:  Procedure Laterality Date   [place]  2002   double   Surgery on Left pinky in 2000     Social History   Socioeconomic History   Marital status: Married    Spouse name: Not on file   Number of children: 2   Years of education: Not on file   Highest education level: Bachelor's degree (e.g., BA, AB, BS)  Occupational History   Occupation: Camera operator    Comment: retired  Tobacco Use   Smoking status: Never   Smokeless tobacco: Former    Types: Snuff   Tobacco comments:    quit snuff at age 70  Vaping Use   Vaping Use: Never used  Substance and Sexual Activity   Alcohol use: Yes    Alcohol/week: 2.0 - 4.0 standard drinks    Types: 2 - 4 Cans of beer per week   Drug use: No   Sexual activity: Not on file  Other Topics Concern   Not on file  Social History Narrative   Not on file   Social Determinants of Health   Financial Resource Strain: Not on file  Food Insecurity: Not on file   Transportation Needs: Not on file  Physical Activity: Not on file  Stress: Not on file  Social Connections: Not on file  Intimate Partner Violence: Not on file   Family Status  Relation Name Status   Mother  Alive   Father  Deceased at age 58       died from cancer in the liver   Brother  Alive   Daughter #1 Alive   Daughter #2 Alive   Family History  Problem Relation Age of Onset   Hypertension Mother    Arthritis Mother    Vision loss Mother        due to age   Cancer Father        in the liver   No Known Allergies  Patient Care Team: Gwyneth Sprout, FNP as PCP - General (Family Medicine) Nori Riis PA-C as Physician Assistant (Urology) Sharlotte Alamo, [place] (Podiatry) Coll, Venda Rodes., PA-C (Orthopedic Surgery) Brendolyn Patty, MD (Dermatology) Pa, [place] (Optometry)   Medications: Outpatient Medications Prior to Visit  Medication Sig   Hypodermic Needles-Disposable (SAFETY-GARD NEEDLE 18G) MISC 0.5 mLs by Does not apply route  every 14 (fourteen) days.   Multiple Vitamin (MULTIVITAMIN) tablet Take 1 tablet by mouth daily.   NEEDLE, DISP, 21 G (BD SAFETYGLIDE SHIELDED NEEDLE) 21G X 1-1/2" MISC 0.5 mLs by Does not apply route every 14 (fourteen) days.   NON FORMULARY Gout Relief 1/2 oz daily   sildenafil (REVATIO) 20 MG tablet TAKE 1 TABLET BY MOUTH AS NEEDED, TAKE 3-5 TABLETS AS NEEDED PROIR TO INTERCOUSE   Syringe, Disposable, 3 ML MISC 0.5 mLs by Does not apply route every 14 (fourteen) days.   testosterone cypionate (DEPOTESTOSTERONE CYPIONATE) 200 MG/ML injection Inject 0.5 cc every fourteen days   [DISCONTINUED] allopurinol (ZYLOPRIM) 300 MG tablet TAKE 1 TABLET(300 MG) BY MOUTH DAILY   [DISCONTINUED] lisinopril-hydrochlorothiazide (ZESTORETIC) 20-25 MG tablet TAKE 1 TABLET BY MOUTH DAILY   [DISCONTINUED] pravastatin (PRAVACHOL) 40 MG tablet Take 1 tablet (40 mg total) by mouth daily. Please schedule office visit before any future refills.    [DISCONTINUED] albuterol (VENTOLIN HFA) 108 (90 Base) MCG/ACT inhaler Inhale 2 puffs into the lungs every 6 (six) hours as needed for wheezing or shortness of breath. (Patient not taking: Reported on 06/05/2021)   [DISCONTINUED] azithromycin (ZITHROMAX) 250 MG tablet Take 2 tablets PO on day one, and one tablet PO daily thereafter until completed.   [DISCONTINUED] benzonatate (TESSALON) 100 MG capsule Take 1 capsule (100 mg total) by mouth 3 (three) times daily as needed.   [DISCONTINUED] doxycycline (VIBRA-TABS) 100 MG tablet Take 1 tablet (100 mg total) by mouth 2 (two) times daily. (Patient not taking: Reported on 06/05/2021)   [DISCONTINUED] predniSONE (DELTASONE) 20 MG tablet Take 1 tablet (20 mg total) by mouth daily with breakfast.   No facility-administered medications prior to visit.    Review of Systems  HENT:  Positive for tinnitus.   Musculoskeletal:  Positive for back pain.  Allergic/Immunologic: Positive for environmental allergies.  All other systems reviewed and are negative.  Last CBC Lab Results  Component Value Date   WBC 8.0 11/24/2020   HGB 15.9 04/10/2021   HCT 45.7 04/10/2021   MCV 87.6 11/24/2020   MCH 28.9 01/28/2020   RDW 14.1 11/24/2020   PLT 161.0 66/12/3014   Last metabolic panel Lab Results  Component Value Date   GLUCOSE 94 11/24/2020   NA 140 11/24/2020   K 3.8 11/24/2020   CL 103 11/24/2020   CO2 29 11/24/2020   BUN 17 11/24/2020   CREATININE 0.96 11/24/2020   GFRNONAA 82 01/28/2020   CALCIUM 9.6 11/24/2020   PROT 7.3 11/24/2020   ALBUMIN 4.7 11/24/2020   LABGLOB 2.0 01/28/2020   AGRATIO 2.4 (H) 01/28/2020   BILITOT 0.7 11/24/2020   ALKPHOS 38 (L) 11/24/2020   AST 24 11/24/2020   ALT 23 11/24/2020   Last lipids Lab Results  Component Value Date   CHOL 177 01/28/2020   HDL 41 01/28/2020   LDLCALC 105 (H) 01/28/2020   TRIG 178 (H) 01/28/2020   CHOLHDL 4.3 01/28/2020   Last hemoglobin A1c No results found for: HGBA1C Last thyroid  functions Lab Results  Component Value Date   TSH 1.210 01/28/2020   Last vitamin D No results found for: 25OHVITD2, 25OHVITD3, VD25OH Last vitamin B12 and Folate No results found for: VITAMINB12, FOLATE    Objective    BP (!) 148/59 Comment: home readings  Pulse 74   Temp 98.1 F (36.7 C) (Oral)   Ht 5\' 9"  (1.753 m)   Wt 201 lb 14.4 oz (91.6 kg)   SpO2 98%  BMI 29.82 kg/m  BP Readings from Last 3 Encounters:  06/05/21 (!) 148/59  02/01/21 (!) 142/82  11/24/20 128/80   Wt Readings from Last 3 Encounters:  06/05/21 201 lb 14.4 oz (91.6 kg)  02/01/21 206 lb (93.4 kg)  11/24/20 202 lb (91.6 kg)     Focused exam given he recently had CPE at outside location  Physical Exam Vitals and nursing note reviewed.  Constitutional:      Appearance: Normal appearance. He is overweight.  HENT:     Head: Normocephalic and atraumatic.  Eyes:     Pupils: Pupils are equal, round, and reactive to light.  Cardiovascular:     Rate and Rhythm: Normal rate and regular rhythm.     Pulses: Normal pulses.     Heart sounds: Normal heart sounds.  Pulmonary:     Effort: Pulmonary effort is normal.     Breath sounds: Normal breath sounds.  Musculoskeletal:        General: Normal range of motion.     Cervical back: Normal range of motion.  Skin:    General: Skin is warm and dry.     Capillary Refill: Capillary refill takes less than 2 seconds.  Neurological:     General: No focal deficit present.     Mental Status: He is alert and oriented to person, place, and time. Mental status is at baseline.  Psychiatric:        Mood and Affect: Mood normal.        Behavior: Behavior normal.        Thought Content: Thought content normal.        Judgment: Judgment normal.    Last depression screening scores PHQ 2/9 Scores 06/05/2021 01/25/2020 01/06/2019  PHQ - 2 Score 0 0 0  PHQ- 9 Score - - 0   Last fall risk screening Fall Risk  06/05/2021  Falls in the past year? 0  Number falls in past  yr: 0  Injury with Fall? 0  Risk for fall due to : No Fall Risks   Last Audit-C alcohol use screening Alcohol Use Disorder Test (AUDIT) 06/05/2021  1. How often do you have a drink containing alcohol? 2  2. How many drinks containing alcohol do you have on a typical day when you are drinking? 1  3. How often do you have six or more drinks on one occasion? 1  AUDIT-C Score 4  4. How often during the last year have you found that you were not able to stop drinking once you had started? -  5. How often during the last year have you failed to do what was normally expected from you because of drinking? -  6. How often during the last year have you needed a first drink in the morning to get yourself going after a heavy drinking session? -  7. How often during the last year have you had a feeling of guilt of remorse after drinking? -  8. How often during the last year have you been unable to remember what happened the night before because you had been drinking? -  9. Have you or someone else been injured as a result of your drinking? -  10. Has a relative or friend or a doctor or another health worker been concerned about your drinking or suggested you cut down? -  Alcohol Use Disorder Identification Test Final Score (AUDIT) -  Alcohol Brief Interventions/Follow-up -   A score of 3 or more in  women, and 4 or more in men indicates increased risk for alcohol abuse, EXCEPT if all of the points are from question 1   No results found for any visits on 06/05/21.  Assessment & Plan    Routine Health Maintenance and Physical Exam  Exercise Activities and Dietary recommendations  Goals      DIET - REDUCE SUGAR INTAKE     Recommend cutting back in sweets and desserts in diet and monitor sugar intake.         Immunization History  Administered Date(s) Administered   Fluad Quad(high Dose 65+) 04/14/2019, 05/12/2020   Influenza-Unspecified 05/07/2017, 04/25/2018   Moderna Sars-Covid-2 Vaccination  09/26/2019, 10/24/2019, 06/14/2020   Pneumococcal Conjugate-13 01/01/2017   Pneumococcal Polysaccharide-23 01/02/2018   Td 08/06/2001   Tdap 10/03/2010   Zoster, Live 11/11/2015    Health Maintenance  Topic Date Due   Zoster Vaccines- Shingrix (1 of 2) Never done   COVID-19 Vaccine (4 - Booster for Moderna series) 08/09/2020   TETANUS/TDAP  10/02/2020   INFLUENZA VACCINE  02/27/2021   COLONOSCOPY (Pts 45-66yrs Insurance coverage will need to be confirmed)  05/14/2027   Pneumonia Vaccine 34+ Years old  Completed   Hepatitis C Screening  Completed   HPV VACCINES  Aged Out    Discussed health benefits of physical activity, and encouraged him to engage in regular exercise appropriate for his age and condition.  Problem List Items Addressed This Visit       Cardiovascular and Mediastinum   Essential hypertension    Chronic, elevated Home Bps lower Recommend DASH diet Less exercise than before Does not wish to be on additional medication Discussed current ASCVD risk The 10-year ASCVD risk score (Arnett DK, et al., 2019) is: 26.8%   Values used to calculate the score:     Age: 34 years     Sex: Male     Is Non-Hispanic African American: No     Diabetic: No     Tobacco smoker: No     Systolic Blood Pressure: 850 mmHg     Is BP treated: Yes     HDL Cholesterol: 41 mg/dL     Total Cholesterol: 177 mg/dL       Relevant Medications   pravastatin (PRAVACHOL) 40 MG tablet   lisinopril-hydrochlorothiazide (ZESTORETIC) 20-25 MG tablet     Musculoskeletal and Integument   Chronic tophaceous gout of left foot    Enlarged Not painful Refill of medication 'aware of diet'      Relevant Medications   allopurinol (ZYLOPRIM) 300 MG tablet     Other   Hyperlipidemia - Primary    Had labs done; will upload rx refilled      Relevant Medications   pravastatin (PRAVACHOL) 40 MG tablet   lisinopril-hydrochlorothiazide (ZESTORETIC) 20-25 MG tablet     Return in about 1 year  (around 06/05/2022) for annual examination.    Vonna Kotyk, FNP, have reviewed all documentation for this visit. The documentation on 06/05/21 for the exam, diagnosis, procedures, and orders are all accurate and complete.    Gwyneth Sprout, [place] [phone] (phone) [phone] (fax)  [place]

## 2021-06-05 NOTE — Assessment & Plan Note (Signed)
Chronic, elevated Home Bps lower Recommend DASH diet Less exercise than before Does not wish to be on additional medication Discussed current ASCVD risk The 10-year ASCVD risk score (Arnett DK, et al., 2019) is: 26.8%   Values used to calculate the score:     Age: 70 years     Sex: Male     Is Non-Hispanic African American: No     Diabetic: No     Tobacco smoker: No     Systolic Blood Pressure: 683 mmHg     Is BP treated: Yes     HDL Cholesterol: 41 mg/dL     Total Cholesterol: 177 mg/dL

## 2021-06-05 NOTE — Assessment & Plan Note (Signed)
Had labs done; will upload rx refilled

## 2021-06-05 NOTE — Progress Notes (Signed)
Annual Wellness Visit     Patient: Curtis [name], Male    DOB: 11-Oct-1950, 70 y.o.   MRN: [id number] Visit Date: 06/05/2021  Today's Provider: Gwyneth Sprout, FNP   Chief Complaint  Patient presents with   Annual Exam   Subjective    Curtis [name] is a 70 y.o. male who presents today for his Annual Wellness Visit. He reports consuming a general diet. He reports walking daily for a mile with new puppy He generally feels well. He reports sleeping well. He does not have additional problems to discuss today.   HPI    Medications: Outpatient Medications Prior to Visit  Medication Sig   Hypodermic Needles-Disposable (SAFETY-GARD NEEDLE 18G) MISC 0.5 mLs by Does not apply route every 14 (fourteen) days.   Multiple Vitamin (MULTIVITAMIN) tablet Take 1 tablet by mouth daily.   NEEDLE, DISP, 21 G (BD SAFETYGLIDE SHIELDED NEEDLE) 21G X 1-1/2" MISC 0.5 mLs by Does not apply route every 14 (fourteen) days.   NON FORMULARY Gout Relief 1/2 oz daily   sildenafil (REVATIO) 20 MG tablet TAKE 1 TABLET BY MOUTH AS NEEDED, TAKE 3-5 TABLETS AS NEEDED PROIR TO INTERCOUSE   Syringe, Disposable, 3 ML MISC 0.5 mLs by Does not apply route every 14 (fourteen) days.   testosterone cypionate (DEPOTESTOSTERONE CYPIONATE) 200 MG/ML injection Inject 0.5 cc every fourteen days   [DISCONTINUED] allopurinol (ZYLOPRIM) 300 MG tablet TAKE 1 TABLET(300 MG) BY MOUTH DAILY   [DISCONTINUED] lisinopril-hydrochlorothiazide (ZESTORETIC) 20-25 MG tablet TAKE 1 TABLET BY MOUTH DAILY   [DISCONTINUED] pravastatin (PRAVACHOL) 40 MG tablet Take 1 tablet (40 mg total) by mouth daily. Please schedule office visit before any future refills.   [DISCONTINUED] albuterol (VENTOLIN HFA) 108 (90 Base) MCG/ACT inhaler Inhale 2 puffs into the lungs every 6 (six) hours as needed for wheezing or shortness of breath. (Patient not taking: Reported on 06/05/2021)   [DISCONTINUED] azithromycin (ZITHROMAX) 250 MG tablet Take 2  tablets PO on day one, and one tablet PO daily thereafter until completed.   [DISCONTINUED] benzonatate (TESSALON) 100 MG capsule Take 1 capsule (100 mg total) by mouth 3 (three) times daily as needed.   [DISCONTINUED] doxycycline (VIBRA-TABS) 100 MG tablet Take 1 tablet (100 mg total) by mouth 2 (two) times daily. (Patient not taking: Reported on 06/05/2021)   [DISCONTINUED] predniSONE (DELTASONE) 20 MG tablet Take 1 tablet (20 mg total) by mouth daily with breakfast.   No facility-administered medications prior to visit.    No Known Allergies  Patient Care Team: Gwyneth Sprout, FNP as PCP - General (Family Medicine) Laneta Simmers as Physician Assistant (Urology) Sharlotte Alamo, [place] (Podiatry) Coll, Venda Rodes., PA-C (Orthopedic Surgery) Brendolyn Patty, MD (Dermatology) Pa, [place] (Optometry)  Review of Systems  HENT:  Positive for tinnitus.   Musculoskeletal:  Positive for back pain and neck pain.  All other systems reviewed and are negative.  Last CBC Lab Results  Component Value Date   WBC 8.0 11/24/2020   HGB 15.9 04/10/2021   HCT 45.7 04/10/2021   MCV 87.6 11/24/2020   MCH 28.9 01/28/2020   RDW 14.1 11/24/2020   PLT 161.0 79/89/2119   Last metabolic panel Lab Results  Component Value Date   GLUCOSE 94 11/24/2020   NA 140 11/24/2020   K 3.8 11/24/2020   CL 103 11/24/2020   CO2 29 11/24/2020   BUN 17 11/24/2020   CREATININE 0.96 11/24/2020   GFRNONAA 82 01/28/2020  CALCIUM 9.6 11/24/2020   PROT 7.3 11/24/2020   ALBUMIN 4.7 11/24/2020   LABGLOB 2.0 01/28/2020   AGRATIO 2.4 (H) 01/28/2020   BILITOT 0.7 11/24/2020   ALKPHOS 38 (L) 11/24/2020   AST 24 11/24/2020   ALT 23 11/24/2020   Last lipids Lab Results  Component Value Date   CHOL 177 01/28/2020   HDL 41 01/28/2020   LDLCALC 105 (H) 01/28/2020   TRIG 178 (H) 01/28/2020   CHOLHDL 4.3 01/28/2020        Objective    Vitals: BP (!) 148/59 Comment: home readings  Pulse 74    Temp 98.1 F (36.7 C) (Oral)   Ht 5\' 9"  (1.753 m)   Wt 201 lb 14.4 oz (91.6 kg)   SpO2 98%   BMI 29.82 kg/m  BP Readings from Last 3 Encounters:  06/05/21 (!) 148/59  02/01/21 (!) 142/82  11/24/20 128/80   Wt Readings from Last 3 Encounters:  06/05/21 201 lb 14.4 oz (91.6 kg)  02/01/21 206 lb (93.4 kg)  11/24/20 202 lb (91.6 kg)      Physical Exam Vitals and nursing note reviewed.  Constitutional:      Appearance: Normal appearance. He is overweight.  HENT:     Head: Normocephalic and atraumatic.  Eyes:     Pupils: Pupils are equal, round, and reactive to light.  Cardiovascular:     Rate and Rhythm: Normal rate and regular rhythm.     Pulses: Normal pulses.     Heart sounds: Normal heart sounds.  Pulmonary:     Effort: Pulmonary effort is normal.     Breath sounds: Normal breath sounds.  Musculoskeletal:        General: Normal range of motion.     Cervical back: Normal range of motion.  Skin:    General: Skin is warm and dry.     Capillary Refill: Capillary refill takes less than 2 seconds.  Neurological:     General: No focal deficit present.     Mental Status: He is alert and oriented to person, place, and time. Mental status is at baseline.  Psychiatric:        Mood and Affect: Mood normal.        Behavior: Behavior normal.        Thought Content: Thought content normal.        Judgment: Judgment normal.    Most recent functional status assessment: In your present state of health, do you have any difficulty performing the following activities: 06/05/2021  Hearing? N  Vision? N  Difficulty concentrating or making decisions? N  Walking or climbing stairs? N  Dressing or bathing? N  Doing errands, shopping? N  Some recent data might be hidden   Most recent fall risk assessment: Fall Risk  06/05/2021  Falls in the past year? 0  Number falls in past yr: 0  Injury with Fall? 0  Risk for fall due to : No Fall Risks    Most recent depression  screenings: PHQ 2/9 Scores 06/05/2021 01/25/2020  PHQ - 2 Score 0 0  PHQ- 9 Score - -   Most recent cognitive screening: 6CIT Screen 01/25/2020  What Year? 0 points  What month? 0 points  What time? 0 points  Count back from 20 0 points  Months in reverse 0 points  Repeat phrase 0 points  Total Score 0   Most recent Audit-C alcohol use screening Alcohol Use Disorder Test (AUDIT) 06/05/2021  1. How often do  you have a drink containing alcohol? 2  2. How many drinks containing alcohol do you have on a typical day when you are drinking? 1  3. How often do you have six or more drinks on one occasion? 1  AUDIT-C Score 4  4. How often during the last year have you found that you were not able to stop drinking once you had started? -  5. How often during the last year have you failed to do what was normally expected from you because of drinking? -  6. How often during the last year have you needed a first drink in the morning to get yourself going after a heavy drinking session? -  7. How often during the last year have you had a feeling of guilt of remorse after drinking? -  8. How often during the last year have you been unable to remember what happened the night before because you had been drinking? -  9. Have you or someone else been injured as a result of your drinking? -  10. Has a relative or friend or a doctor or another health worker been concerned about your drinking or suggested you cut down? -  Alcohol Use Disorder Identification Test Final Score (AUDIT) -  Alcohol Brief Interventions/Follow-up -   A score of 3 or more in women, and 4 or more in men indicates increased risk for alcohol abuse, EXCEPT if all of the points are from question 1   No results found for any visits on 06/05/21.  Assessment & Plan     Annual wellness visit done today including the all of the following: Reviewed patient's Family Medical History Reviewed and updated list of patient's medical  providers Assessment of cognitive impairment was done Assessed patient's functional ability Established a written schedule for health screening [place] Completed and Reviewed  Exercise Activities and Dietary recommendations  Goals      DIET - REDUCE SUGAR INTAKE     Recommend cutting back in sweets and desserts in diet and monitor sugar intake.         Immunization History  Administered Date(s) Administered   Fluad Quad(high Dose 65+) 04/14/2019, 05/12/2020   Influenza-Unspecified 05/07/2017, 04/25/2018   Moderna Sars-Covid-2 Vaccination 09/26/2019, 10/24/2019, 06/14/2020   Pneumococcal Conjugate-13 01/01/2017   Pneumococcal Polysaccharide-23 01/02/2018   Td 08/06/2001   Tdap 10/03/2010   Zoster, Live 11/11/2015    Health Maintenance  Topic Date Due   Zoster Vaccines- Shingrix (1 of 2) Never done   COVID-19 Vaccine (4 - Booster for Moderna series) 08/09/2020   TETANUS/TDAP  10/02/2020   INFLUENZA VACCINE  02/27/2021   COLONOSCOPY (Pts 45-16yrs Insurance coverage will need to be confirmed)  05/14/2027   Pneumonia Vaccine 65+ Years old  Completed   Hepatitis C Screening  Completed   HPV VACCINES  Aged Out     Discussed health benefits of physical activity, and encouraged him to engage in regular exercise appropriate for his age and condition.    Problem List Items Addressed This Visit       Cardiovascular and Mediastinum   Essential hypertension    Chronic, elevated Home Bps lower Recommend DASH diet Less exercise than before Does not wish to be on additional medication Discussed current ASCVD risk The 10-year ASCVD risk score (Arnett DK, et al., 2019) is: 26.8%   Values used to calculate the score:     Age: 35 years     Sex: Male  Is Non-Hispanic African American: No     Diabetic: No     Tobacco smoker: No     Systolic Blood Pressure: 098 mmHg     Is BP treated: Yes     HDL Cholesterol: 41 mg/dL     Total Cholesterol: 177  mg/dL       Relevant Medications   pravastatin (PRAVACHOL) 40 MG tablet   lisinopril-hydrochlorothiazide (ZESTORETIC) 20-25 MG tablet     Musculoskeletal and Integument   Chronic tophaceous gout of left foot    Enlarged Not painful Refill of medication 'aware of diet'      Relevant Medications   allopurinol (ZYLOPRIM) 300 MG tablet     Other   Hyperlipidemia - Primary    Had labs done; will upload rx refilled      Relevant Medications   pravastatin (PRAVACHOL) 40 MG tablet   lisinopril-hydrochlorothiazide (ZESTORETIC) 20-25 MG tablet     Return in about 1 year (around 06/05/2022) for annual examination.     Vonna Kotyk, FNP, have reviewed all documentation for this visit. The documentation on 06/05/21 for the exam, diagnosis, procedures, and orders are all accurate and complete.    Gwyneth Sprout, [place] [phone] (phone) [phone] (fax)  [place]

## 2021-06-05 NOTE — Assessment & Plan Note (Signed)
Enlarged Not painful Refill of medication 'aware of diet'

## 2021-06-14 ENCOUNTER — Other Ambulatory Visit: Payer: Self-pay | Admitting: Physician Assistant

## 2021-06-14 DIAGNOSIS — J40 Bronchitis, not specified as acute or chronic: Secondary | ICD-10-CM

## 2021-06-26 ENCOUNTER — Other Ambulatory Visit: Payer: Self-pay | Admitting: Urology

## 2021-06-26 DIAGNOSIS — E349 Endocrine disorder, unspecified: Secondary | ICD-10-CM

## 2021-06-26 MED ORDER — TESTOSTERONE CYPIONATE 200 MG/ML IM SOLN: INTRAMUSCULAR | 0 refills | Status: DC

## 2021-07-14 ENCOUNTER — Telehealth: Payer: Self-pay

## 2021-07-14 NOTE — Telephone Encounter (Signed)
Copied from [place] [id number]. Topic: General - Other >> Jul 13, 2021  4:19 PM Bayard Beaver wrote: Patient called in to schedule physical, system shows he had one with Dr Rollene Rotunda on 24-Jun-2021,but he states he didn't have one. Please call back >> Jul 13, 2021  4:28 PM Oneta Rack wrote: Patient called back stating he disconnected from previous agent and wanted to ensure 06/24/2021 appointment is re coded and states please do not file as a annual, no blood work was done. He would like his physical; with PCP in May. Caller would like a follow up call to confirm

## 2021-08-03 ENCOUNTER — Other Ambulatory Visit: Payer: Self-pay

## 2021-08-03 ENCOUNTER — Telehealth: Payer: Self-pay | Admitting: Pharmacist

## 2021-08-03 MED ORDER — PAXLOVID (300/100) 20 X 150 MG & 10 X 100MG PO TBPK
ORAL_TABLET | ORAL | 0 refills | Status: DC
  Filled 2021-08-03: qty 30, 5d supply, fill #0

## 2021-08-03 NOTE — Telephone Encounter (Signed)
Outpatient Pharmacy Oral COVID Treatment Note  I connected with Curtis [name] on 08/03/2021/12:44 PM by telephone and verified that I am speaking with the correct person using two identifiers.  I discussed the limitations, risks, security, and privacy concerns of performing an evaluation and management service by telephone and the availability of in person appointments via referral to a physician. The patient expressed understanding and agreed to proceed.  Pharmacy location: [facility]  Diagnosis: COVID-19 infection  Purpose of visit: Discussion of potential use of Paxlovid, a new treatment for mild to moderate COVID-19 viral infection in non-hospitalized patients.  Subjective/Objective: Patient is a 71 y.o. male who is presenting with COVID 19 viral infection.  COVID 19 viral infection. Their symptoms began on 07/30/21 with sore throat, fever, and congestion.  The patient has confirmed COVID-19 via a home test on 08/03/21.   Past Medical History:  Diagnosis Date   Arthritis    Gout    HTN (hypertension)    Hx of dysplastic nevus 12/14/2019   L lower flank Moderate atypia   Hyperlipidemia    Hypogonadism in male    Low back pain    Morton neuroma, left     No Known Allergies   Current Outpatient Medications:    allopurinol (ZYLOPRIM) 300 MG tablet, Take 1 tablet (300 mg total) by mouth daily., Disp: 90 tablet, Rfl: 3   Hypodermic Needles-Disposable (SAFETY-GARD NEEDLE 18G) MISC, 0.5 mLs by Does not apply route every 14 (fourteen) days., Disp: 30 each, Rfl: 5   lisinopril-hydrochlorothiazide (ZESTORETIC) 20-25 MG tablet, Take 1 tablet by mouth daily., Disp: 90 tablet, Rfl: 3   Multiple Vitamin (MULTIVITAMIN) tablet, Take 1 tablet by mouth daily., Disp: , Rfl:    NEEDLE, DISP, 21 G (BD SAFETYGLIDE SHIELDED NEEDLE) 21G X 1-1/2" MISC, 0.5 mLs by Does not apply route every 14 (fourteen) days., Disp: 30 each, Rfl: 5   nirmatrelvir & ritonavir (PAXLOVID, 300/100,) 20 x 150  MG & 10 x $Re'100MG'wOq$  TBPK, Take 3 tablets by mouth twice a day for 5 days, Disp: 30 tablet, Rfl: 0   NON FORMULARY, Gout Relief 1/2 oz daily, Disp: , Rfl:    pravastatin (PRAVACHOL) 40 MG tablet, Take 1 tablet (40 mg total) by mouth daily., Disp: 90 tablet, Rfl: 3   sildenafil (REVATIO) 20 MG tablet, TAKE 1 TABLET BY MOUTH AS NEEDED, TAKE 3-5 TABLETS AS NEEDED PROIR TO INTERCOUSE, Disp: 30 tablet, Rfl: 11   Syringe, Disposable, 3 ML MISC, 0.5 mLs by Does not apply route every 14 (fourteen) days., Disp: 30 each, Rfl: 5   testosterone cypionate (DEPOTESTOSTERONE CYPIONATE) 200 MG/ML injection, Inject 0.5 cc every fourteen days, Disp: 3 mL, Rfl: 0  Lab Monitoring: eGFR 80  Drug Interactions Noted: Medications were reviewed and no drug interactions were found.  Plan:  This patient is a 71 y.o. male that meets the criteria for Emergency Use Authorization of Paxlovid. After reviewing the emergency use authorization with the patient, the patient agrees to receive Paxlovid.  Through FDA guidance and current [place] standing order Paxlovid will be prescribed to the patient.   Patient contacted for counseling on 08/03/21 and verbalized understanding.   Delivery or Pick-Up Date: 08/03/21  Follow up instructions:    Take prescription BID x 5 days as directed Counseling was provided by pharmacist. Reach out to pharmacist with follow up questions For concerns regarding further COVID symptoms please follow up with your PCP or urgent care For urgent or life-threatening issues, seek care  at your local emergency [place] 08/03/2021, 12:44 PM [place] Pharmacist Phone# [phone]

## 2021-08-18 ENCOUNTER — Other Ambulatory Visit: Payer: Self-pay | Admitting: Family Medicine

## 2021-08-18 DIAGNOSIS — M1A9XX1 Chronic gout, unspecified, with tophus (tophi): Secondary | ICD-10-CM

## 2021-08-22 NOTE — Progress Notes (Signed)
[place] Phone: [phone] Subjective:   [name], am serving as a scribe for Dr. Hulan Saas. This visit occurred during the SARS-CoV-2 public health emergency.  Safety protocols were in place, including screening questions prior to the visit, additional usage of staff PPE, and extensive cleaning of exam room while observing appropriate contact time as indicated for disinfecting solutions.   I'm seeing this patient by the request  of:  Bacigalupo, Dionne Bucy, MD  CC: Low back pain  XBM:[id number]  Curtis [name] is a 71 y.o. male coming in with complaint of LBP and neck pain. Last seen in July 2022 for L shoulder pain. Patient states that his shoulder is doing well. Patient has had lumbar spine pain since college when he was doing a deadlift. Has pain mostly in mornings but pain improves with movement. Hard to bend forward. Denies any radiating symptoms. Pain will radiate up into neck as well. Does back exercises and planks to help with pain.   December 2019 xray lumbar IMPRESSION: Mild degenerative disc disease changes lumbar spine.    Past Medical History:  Diagnosis Date   Arthritis    Gout    HTN (hypertension)    Hx of dysplastic nevus 12/14/2019   L lower flank Moderate atypia   Hyperlipidemia    Hypogonadism in male    Low back pain    Morton neuroma, left    Past Surgical History:  Procedure Laterality Date   ACHILLES TENDON SURGERY Right 1995   HERNIA REPAIR  2002   double   Surgery on Left pinky in 2000     Social History   Socioeconomic History   Marital status: Married    Spouse name: Not on file   Number of children: 2   Years of education: Not on file   Highest education level: Bachelor's degree (e.g., BA, AB, BS)  Occupational History   Occupation: Camera operator    Comment: retired  Tobacco Use   Smoking status: Never   Smokeless tobacco: Former    Types:  Snuff   Tobacco comments:    quit snuff at age 56  Vaping Use   Vaping Use: Never used  Substance and Sexual Activity   Alcohol use: Yes    Alcohol/week: 2.0 - 4.0 standard drinks    Types: 2 - 4 Cans of beer per week   Drug use: No   Sexual activity: Not on file  Other Topics Concern   Not on file  Social History Narrative   Not on file   Social Determinants of Health   Financial Resource Strain: Not on file  Food Insecurity: Not on file  Transportation Needs: Not on file  Physical Activity: Not on file  Stress: Not on file  Social Connections: Not on file   No Known Allergies Family History  Problem Relation Age of Onset   Hypertension Mother    Arthritis Mother    Vision loss Mother        due to age   Cancer Father        in the liver    Current Outpatient Medications (Endocrine & Metabolic):    testosterone cypionate (DEPOTESTOSTERONE CYPIONATE) 200 MG/ML injection, Inject 0.5 cc every fourteen days  Current Outpatient Medications (Cardiovascular):    amLODipine (NORVASC) 5 MG tablet, Take 0.5 tablets (2.5 mg total) by mouth daily.   lisinopril-hydrochlorothiazide (ZESTORETIC) 20-25 MG tablet, Take 1 tablet by  mouth daily.   pravastatin (PRAVACHOL) 40 MG tablet, Take 1 tablet (40 mg total) by mouth daily.   sildenafil (REVATIO) 20 MG tablet, TAKE 1 TABLET BY MOUTH AS NEEDED, TAKE 3-5 TABLETS AS NEEDED PROIR TO INTERCOUSE   Current Outpatient Medications (Analgesics):    allopurinol (ZYLOPRIM) 300 MG tablet, TAKE 1 TABLET(300 MG) BY MOUTH DAILY   Current Outpatient Medications (Other):    Hypodermic Needles-Disposable (SAFETY-GARD NEEDLE 18G) MISC, 0.5 mLs by Does not apply route every 14 (fourteen) days.   Multiple Vitamin (MULTIVITAMIN) tablet, Take 1 tablet by mouth daily.   NEEDLE, DISP, 21 G (BD SAFETYGLIDE SHIELDED NEEDLE) 21G X 1-1/2" MISC, 0.5 mLs by Does not apply route every 14 (fourteen) days.   NON FORMULARY, Gout Relief 1/2 oz daily   Syringe,  Disposable, 3 ML MISC, 0.5 mLs by Does not apply route every 14 (fourteen) days.   nirmatrelvir & ritonavir (PAXLOVID, 300/100,) 20 x 150 MG & 10 x 100MG  TBPK, Take 3 tablets by mouth twice a day for 5 days   Reviewed prior external information including notes and imaging from  primary care provider As well as notes that were available from care everywhere and other healthcare systems.  Past medical history, social, surgical and family history all reviewed in electronic medical record.  No pertanent information unless stated regarding to the chief complaint.   Review of Systems:  No headache, visual changes, nausea, vomiting, diarrhea, constipation, dizziness, abdominal pain, skin rash, fevers, chills, night sweats, weight loss, swollen lymph nodes,  joint swelling, chest pain, shortness of breath, mood changes. POSITIVE muscle aches, body aches  Objective  Blood pressure (!) 180/60, pulse 78, height 5\' 9"  (1.753 m), weight 203 lb (92.1 kg), SpO2 97 %.   General: No apparent distress alert and oriented x3 mood and affect normal, dressed appropriately.  HEENT: Pupils equal, extraocular movements intact  Respiratory: Patient's speak in full sentences and does not appear short of breath  Cardiovascular: No lower extremity edema, non tender, no erythema  Gait normal with good balance and coordination.  MSK: Patient's low back does have some tightness noted.  Patient has very mild tightness with the left hamstring compared to the contralateral side.  5 out of 5 strength of the lower extremity.  Patient does have tightness of the right-sided paraspinal musculature compared to left.  Deep tendon reflexes are intact.   [id number]; 15 additional minutes spent for Therapeutic exercises as stated in above notes.  This included exercises focusing on stretching, strengthening, with significant focus on eccentric aspects.   Long term goals include an improvement in range of motion, strength, endurance as well  as avoiding reinjury. Patient's frequency would include in 1-2 times a day, 3-5 times a week for a duration of 6-12 weeks.  Low back exercises that included:  Pelvic tilt/bracing instruction to focus on control of the pelvic girdle and lower abdominal muscles  Glute strengthening exercises, focusing on proper firing of the glutes without engaging the low back muscles Proper stretching techniques for maximum relief for the hamstrings, hip flexors, low back and some rotation where tolerated Proper technique shown and discussed handout in great detail with ATC.  All questions were discussed and answered.     Impression and Recommendations:     The above documentation has been reviewed and is accurate and complete Lyndal Pulley, DO

## 2021-08-23 ENCOUNTER — Ambulatory Visit: Payer: Medicare Other | Admitting: Family Medicine

## 2021-08-23 ENCOUNTER — Other Ambulatory Visit: Payer: Self-pay

## 2021-08-23 ENCOUNTER — Ambulatory Visit (INDEPENDENT_AMBULATORY_CARE_PROVIDER_SITE_OTHER): Payer: Medicare Other

## 2021-08-23 VITALS — BP 180/60 | HR 78 | Ht 69.0 in | Wt 203.0 lb

## 2021-08-23 DIAGNOSIS — I10 Essential (primary) hypertension: Secondary | ICD-10-CM | POA: Diagnosis not present

## 2021-08-23 DIAGNOSIS — M545 Low back pain, unspecified: Secondary | ICD-10-CM | POA: Diagnosis not present

## 2021-08-23 DIAGNOSIS — M5136 Other intervertebral disc degeneration, lumbar region: Secondary | ICD-10-CM | POA: Insufficient documentation

## 2021-08-23 IMAGING — DX DG LUMBAR SPINE 2-3V
3 series · 3 of 3 positions shown · non-contrast
Comparison: [DATE]

CLINICAL DATA: Lumbar spine pain, chronic.

EXAM:
LUMBAR SPINE - 2-3 VIEW

[l-spine ap]
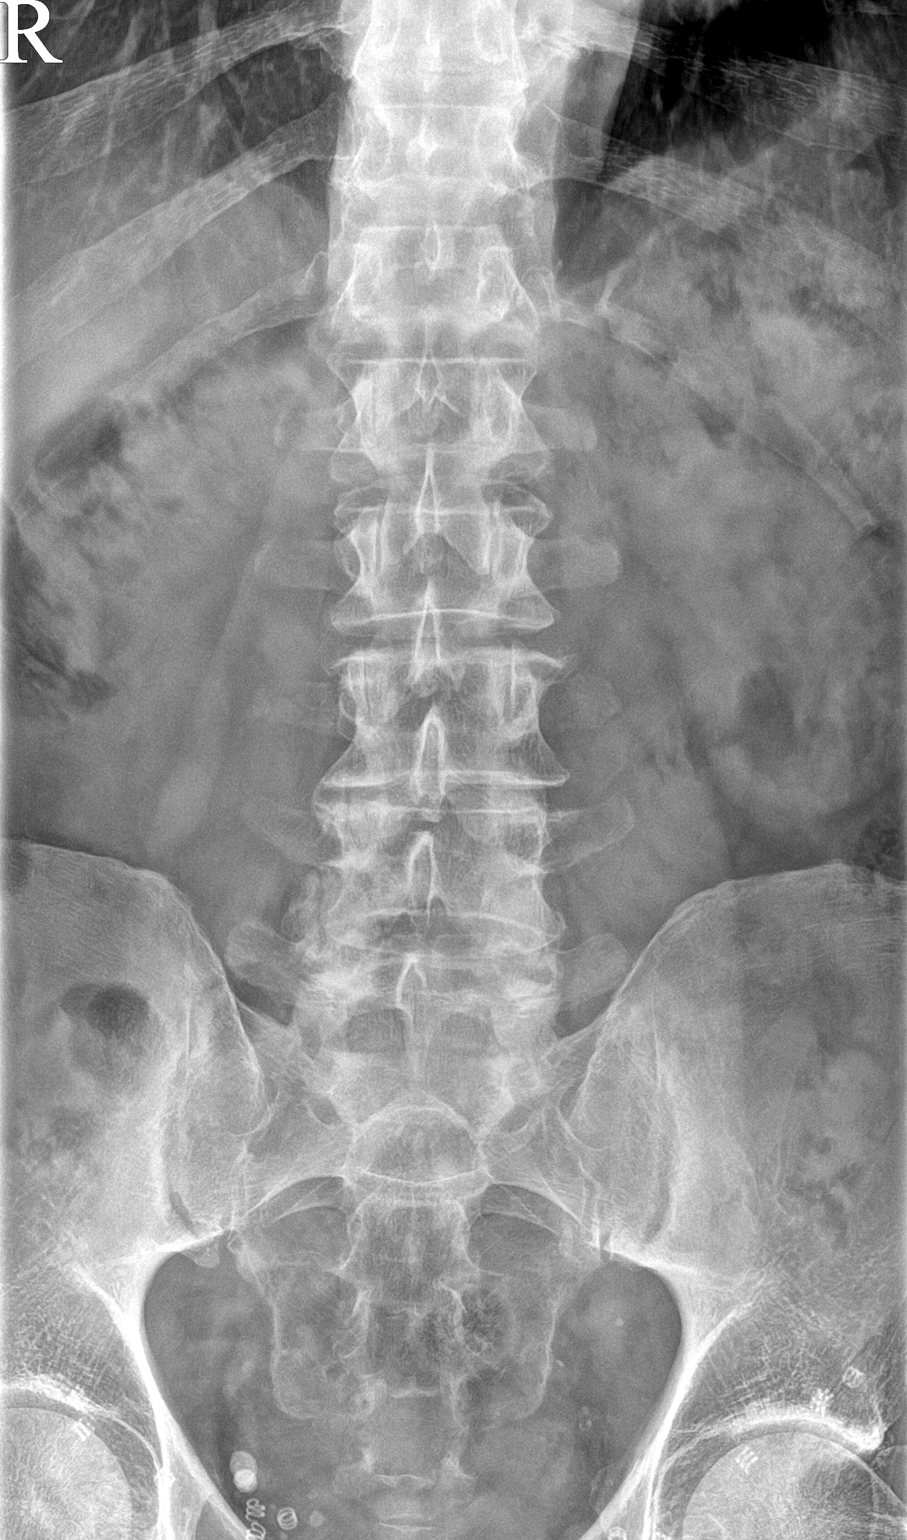

[l-spine lateral]
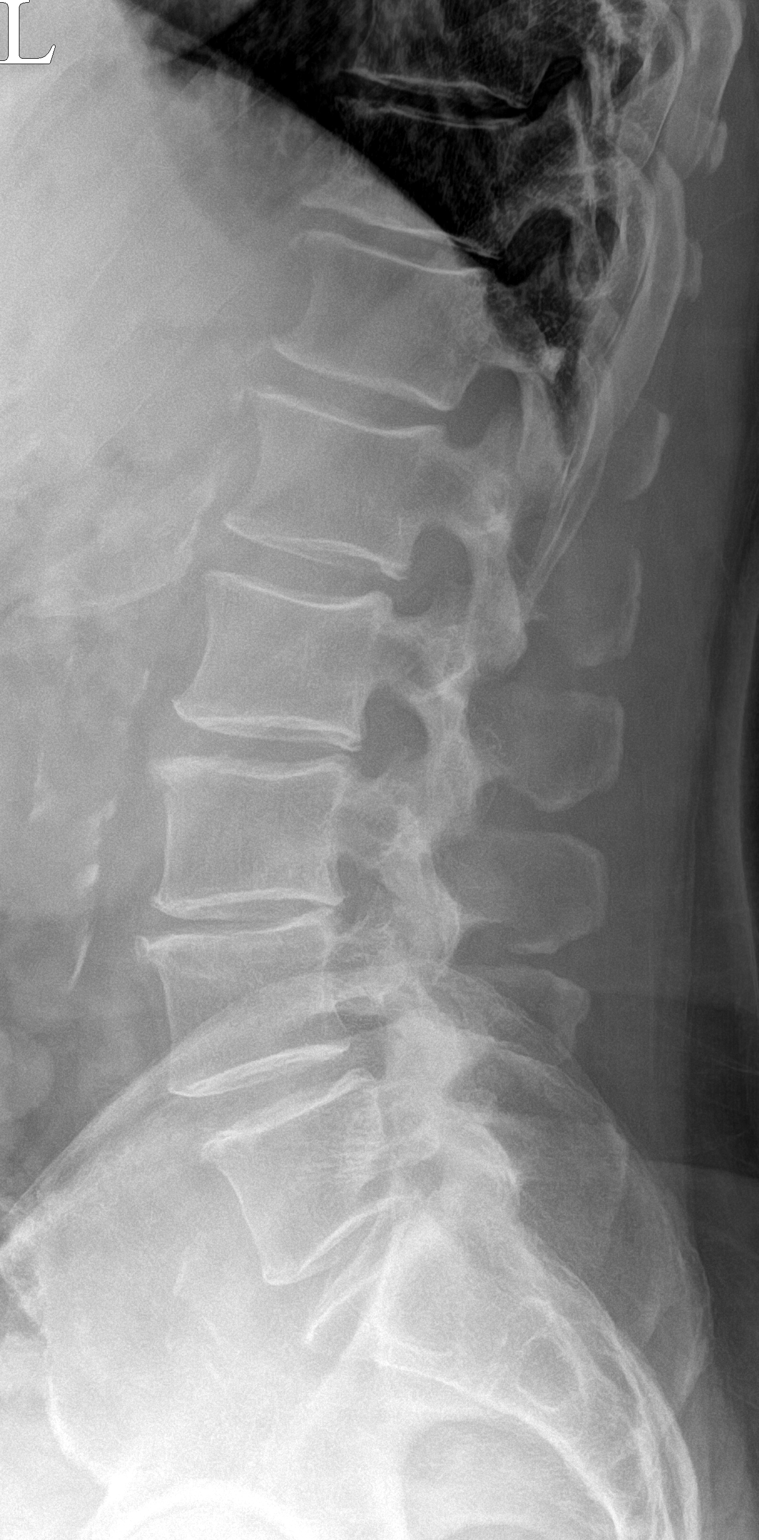

[l-spine spot]
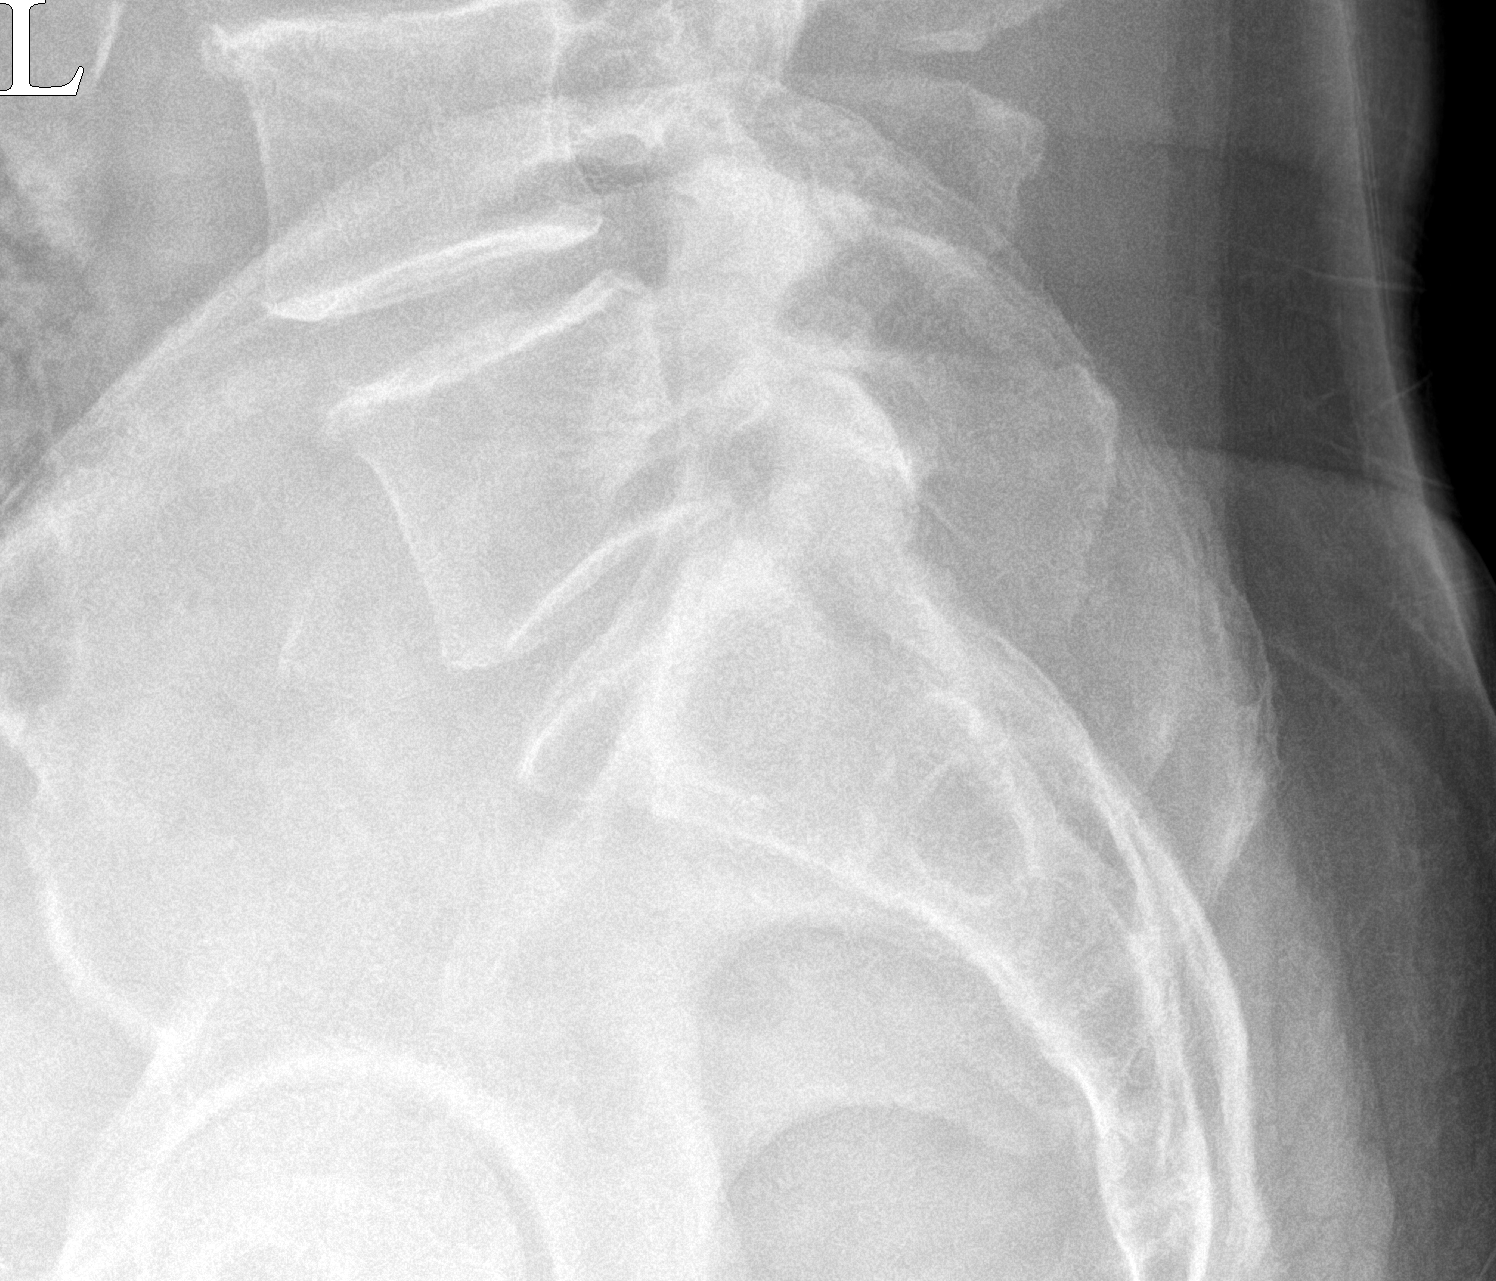

[3 of 3 positions shown; findings below may reference images not displayed]

FINDINGS: There are 5 non-rib-bearing lumbar-type vertebral bodies. Mild right
L4-5 disc space narrowing on frontal view.

Minimal 1 to 2 mm retrolisthesis of L2 on L3 and L3 on L4 on lateral
view, unchanged. Moderate posterior L3-4 and mild posterior L2-3 and
L5-S1 disc space narrowing, unchanged.

Vertebral body heights are maintained. L5-S1 greater than L4-5
moderate facet joint arthropathy.
IMPRESSION: Mild L3-4 through L5-S1 degenerative disc and endplate changes,
similar to prior.

## 2021-08-23 MED ORDER — AMLODIPINE BESYLATE 5 MG PO TABS: 2.5000 mg | ORAL_TABLET | Freq: Every day | ORAL | 0 refills | Status: DC

## 2021-08-23 NOTE — Assessment & Plan Note (Signed)
Due to the elevation in the blood pressure will start on a low-dose of amlodipine.  Patient is to be seeing his new primary care provider in the near future.  It appears in the chart that patient is not scheduled till May.  Patient does have any chest pain, shortness of breath, arm pain needs to seek medical attention.  We will be checking him again in 4 weeks otherwise we will see if we need to increase the amlodipine.

## 2021-08-23 NOTE — Patient Instructions (Signed)
Xray today Hip flexor exercises Try to see PCP  See me again in 5 weeks

## 2021-08-23 NOTE — Assessment & Plan Note (Signed)
Patient does have degenerative disc disease.  I do think the patient does have some underlying lumbar arthritis that could be contributing.  Patient on exam though did not have anything strongly out of place I do not feel that that would make a significant improvement at this time.  Patient has had those some elevated blood pressure and we will start him on a low-dose of amlodipine.  Warned of potential side effects but likely will be okay.  Patient is going to be seeing a new primary care provider and we will send note as well.  Discussed with patient icing regimen and home exercises.  Follow-up with me again in 4 to 5 weeks.  Patient did work with Product/process development scientist.  Worsening pain consider advanced imaging in the possibility of formal physical therapy

## 2021-08-23 NOTE — Progress Notes (Signed)
Established patient visit   Patient: Curtis [name]   DOB: 1951-07-10   71 y.o. Male  MRN: [id number] Visit Date: 08/24/2021  Today's healthcare provider: Lavon Paganini, MD    I,Joseline E Rosas,acting as a scribe for Lavon Paganini, MD.,have documented all relevant documentation on the behalf of Lavon Paganini, MD,as directed by  Lavon Paganini, MD while in the presence of Lavon Paganini, MD.   Chief Complaint  Patient presents with   Hypertension   Subjective    HPI  Elevated Blood pressure: Patient was seen 2 months ago for Hypertension for follow-up. Patient was at [place] [facility] yesterday morning and his blood pressure was 180/60. Home readings 130's-160's/50'-60's. Dr. Charlann Boxer prescribed Amlodipine. Patient is also scheduled for eye surgery 08/30/2021.Cardiac symptoms none. Patient denies chest pain, chest pressure/discomfort, exertional chest pressure/discomfort, fatigue, irregular heart beat, lower extremity edema, near-syncope, palpitations, and syncope.  Reports that he is feeling a little "sluggish" but it was happening before he started the Amlodipine. No side effect from the medicine. Also reports that his blood pressure readings are higher on Left arm than Right arm.  Medications: Outpatient Medications Prior to Visit  Medication Sig   allopurinol (ZYLOPRIM) 300 MG tablet TAKE 1 TABLET(300 MG) BY MOUTH DAILY   Hypodermic Needles-Disposable (SAFETY-GARD NEEDLE 18G) MISC 0.5 mLs by Does not apply route every 14 (fourteen) days.   lisinopril-hydrochlorothiazide (ZESTORETIC) 20-25 MG tablet Take 1 tablet by mouth daily.   Multiple Vitamin (MULTIVITAMIN) tablet Take 1 tablet by mouth daily.   NEEDLE, DISP, 21 G (BD SAFETYGLIDE SHIELDED NEEDLE) 21G X 1-1/2" MISC 0.5 mLs by Does not apply route every 14 (fourteen) days.   NON FORMULARY Gout Relief 1/2 oz daily   pravastatin (PRAVACHOL) 40 MG tablet Take 1 tablet (40 mg total) by  mouth daily.   sildenafil (REVATIO) 20 MG tablet TAKE 1 TABLET BY MOUTH AS NEEDED, TAKE 3-5 TABLETS AS NEEDED PROIR TO INTERCOUSE   Syringe, Disposable, 3 ML MISC 0.5 mLs by Does not apply route every 14 (fourteen) days.   testosterone cypionate (DEPOTESTOSTERONE CYPIONATE) 200 MG/ML injection Inject 0.5 cc every fourteen days   [DISCONTINUED] amLODipine (NORVASC) 5 MG tablet Take 0.5 tablets (2.5 mg total) by mouth daily.   [DISCONTINUED] nirmatrelvir & ritonavir (PAXLOVID, 300/100,) 20 x 150 MG & 10 x 100MG  TBPK Take 3 tablets by mouth twice a day for 5 days   No facility-administered medications prior to visit.    Review of Systems per HPI     Objective    BP (!) 160/52 (BP Location: Left Arm, Patient Position: Sitting, Cuff Size: Large) Comment: manual   Pulse 78    Temp 98.4 F (36.9 C) (Oral)    Resp 16    Ht 5\' 9"  (1.753 m)    Wt 204 lb 6.4 oz (92.7 kg)    SpO2 97%    BMI 30.18 kg/m  {Show previous vital signs (optional):23777}  Physical Exam Vitals reviewed.  Constitutional:      General: He is not in acute distress.    Appearance: Normal appearance. He is not diaphoretic.  HENT:     Head: Normocephalic and atraumatic.  Eyes:     General: No scleral icterus.    Conjunctiva/sclera: Conjunctivae normal.  Cardiovascular:     Rate and Rhythm: Normal rate and regular rhythm.     Pulses: Normal pulses.     Heart sounds: Normal heart sounds. No murmur heard. Pulmonary:  Effort: Pulmonary effort is normal. No respiratory distress.     Breath sounds: Normal breath sounds. No wheezing or rhonchi.  Musculoskeletal:     Cervical back: Neck supple.     Right lower leg: No edema.     Left lower leg: No edema.  Lymphadenopathy:     Cervical: No cervical adenopathy.  Skin:    General: Skin is warm and dry.     Capillary Refill: Capillary refill takes less than 2 seconds.     Findings: No rash.  Neurological:     Mental Status: He is alert and oriented to person, place, and  time. Mental status is at baseline.  Psychiatric:        Mood and Affect: Mood normal.        Behavior: Behavior normal.      No results found for any visits on 08/24/21.  Assessment & Plan     Problem List Items Addressed This Visit       Cardiovascular and Mediastinum   Essential hypertension - Primary    Chronic and uncontrolled Was elevated 916 systolic yesterday at sports med appointments and 945 systolic today Patient is overall asymptomatic Continue lisinopril-HCTZ 20-25 mg daily He was started on amlodipine 2.5 mg daily yesterday, and while it is too soon to see much change on this dose, I do think 5 mg is more appropriate for her current blood pressure elevation, so I will increase that He will let me know on Monday if his blood pressure is still greater than 038 systolic and we will increase to 10 mg daily I am trying to get this down somewhat quickly as he does have an upcoming eye surgery next week We will follow-up with him in 1 month      Relevant Medications   amLODipine (NORVASC) 5 MG tablet     Return in about 4 weeks (around 09/21/2021) for BP f/u.      I, Lavon Paganini, MD, have reviewed all documentation for this visit. The documentation on 08/24/21 for the exam, diagnosis, procedures, and orders are all accurate and complete.   Song Garris, Dionne Bucy, MD, MPH [facility]

## 2021-08-24 ENCOUNTER — Encounter: Payer: Self-pay | Admitting: Family Medicine

## 2021-08-24 ENCOUNTER — Other Ambulatory Visit: Payer: Self-pay

## 2021-08-24 ENCOUNTER — Ambulatory Visit (INDEPENDENT_AMBULATORY_CARE_PROVIDER_SITE_OTHER): Payer: Medicare Other | Admitting: Family Medicine

## 2021-08-24 VITALS — BP 160/52 | HR 78 | Temp 98.4°F | Resp 16 | Ht 69.0 in | Wt 204.4 lb

## 2021-08-24 DIAGNOSIS — I10 Essential (primary) hypertension: Secondary | ICD-10-CM

## 2021-08-24 MED ORDER — AMLODIPINE BESYLATE 5 MG PO TABS: 5.0000 mg | ORAL_TABLET | Freq: Every day | ORAL | 0 refills | Status: DC

## 2021-08-24 NOTE — Assessment & Plan Note (Addendum)
Chronic and uncontrolled Was elevated 735 systolic yesterday at sports med appointments and 329 systolic today Patient is overall asymptomatic Continue lisinopril-HCTZ 20-25 mg daily He was started on amlodipine 2.5 mg daily yesterday, and while it is too soon to see much change on this dose, I do think 5 mg is more appropriate for her current blood pressure elevation, so I will increase that He will let me know on Monday if his blood pressure is still greater than 924 systolic and we will increase to 10 mg daily I am trying to get this down somewhat quickly as he does have an upcoming eye surgery next week We will follow-up with him in 1 month

## 2021-08-28 ENCOUNTER — Encounter: Payer: Self-pay | Admitting: Family Medicine

## 2021-09-11 ENCOUNTER — Other Ambulatory Visit: Payer: Medicare Other

## 2021-09-11 ENCOUNTER — Other Ambulatory Visit: Payer: Self-pay

## 2021-09-11 DIAGNOSIS — E349 Endocrine disorder, unspecified: Secondary | ICD-10-CM

## 2021-09-11 DIAGNOSIS — N401 Enlarged prostate with lower urinary tract symptoms: Secondary | ICD-10-CM

## 2021-09-12 LAB — PSA: Prostate Specific Ag, Serum: 2.5 ng/mL (ref 0.0–4.0)

## 2021-09-12 LAB — HEMOGLOBIN AND HEMATOCRIT, BLOOD
Hematocrit: 42 % (ref 37.5–51.0)
Hemoglobin: 14.8 g/dL (ref 13.0–17.7)

## 2021-09-12 LAB — TESTOSTERONE: Testosterone: 420 ng/dL (ref 264–916)

## 2021-09-12 NOTE — Progress Notes (Signed)
09/13/2021 8:49 AM   Curtis [name] [birth year] [id number]  Referring provider: [name], PA-C No address on file  Chief Complaint  Patient presents with   Benign Prostatic Hypertrophy   Erectile Dysfunction   Hypogonadism   Urological history: 1. Testosterone deficiency -contributing factors of age and obesity -Testosterone level 420 on September 11, 2021 -Hemoglobin/hematocrit 14.8/42.0 on September 11, 2021 - managed with testosterone cypionate 200 mg/cc, 0.5 cc every 14 days   2. BPH with LU TS -PSA 2.5 in 09/11/2021 -I PSS 5/1  3. ED -contributing factors of age, BPH, testosterone deficiency, HTN and HLD -SHIM 13 -managed with sildenafil 20 mg, on-demand-dosing  HPI: Curtis [name] is a 71 y.o. male who presents today for 6 month follow up.   No urinary complaints.  Patient denies any modifying or aggravating factors.  Patient denies any gross hematuria, dysuria or suprapubic/flank pain.  Patient denies any fevers, chills, nausea or vomiting.     IPSS     Row Name 09/13/21 0800         International Prostate Symptom Score   How often have you had the sensation of not emptying your bladder? Not at All     How often have you had to urinate less than every two hours? Not at All     How often have you found you stopped and started again several times when you urinated? Less than half the time     How often have you found it difficult to postpone urination? Not at All     How often have you had a weak urinary stream? Less than half the time     How often have you had to strain to start urination? Not at All     How many times did you typically get up at night to urinate? 1 Time     Total IPSS Score 5       Quality of Life due to urinary symptoms   If you were to spend the rest of your life with your urinary condition just the way it is now how would you feel about that? Pleased               Score:  1-7 Mild 8-19 Moderate 20-35  Severe  Patient still having spontaneous erections.  He denies any pain or curvature with erections.  He requires PDE5i's to have satisfactory intercourse.     SHIM     Row Name 09/13/21 0828         SHIM: Over the last 6 months:   How do you rate your confidence that you could get and keep an erection? Low     When you had erections with sexual stimulation, how often were your erections hard enough for penetration (entering your partner)? A Few Times (much less than half the time)     During sexual intercourse, how often were you able to maintain your erection after you had penetrated (entered) your partner? Sometimes (about half the time)     During sexual intercourse, how difficult was it to maintain your erection to completion of intercourse? Difficult     When you attempted sexual intercourse, how often was it satisfactory for you? Sometimes (about half the time)       SHIM Total Score   SHIM 13               Score: 1-7 Severe ED 8-11 Moderate ED 12-16 Mild-Moderate ED 17-21 Mild ED 22-25  No ED  PMH: Past Medical History:  Diagnosis Date   Arthritis    Gout    HTN (hypertension)    Hx of dysplastic nevus 12/14/2019   L lower flank Moderate atypia   Hyperlipidemia    Hypogonadism in male    Low back pain    Morton neuroma, left    Surgical History: Past Surgical History:  Procedure Laterality Date   Baring  2002   double   Surgery on Left pinky in 2000     Home Medications:  Allergies as of 09/13/2021   No Known Allergies      Medication List        Accurate as of September 13, 2021  8:49 AM. If you have any questions, ask your nurse or doctor.          allopurinol 300 MG tablet Commonly known as: ZYLOPRIM TAKE 1 TABLET(300 MG) BY MOUTH DAILY   amLODipine 5 MG tablet Commonly known as: NORVASC Take 1 tablet (5 mg total) by mouth daily.   BD SafetyGlide Shielded Needle 21G X 1-1/2"  Misc Generic drug: NEEDLE (DISP) 21 G 0.5 mLs by Does not apply route every 14 (fourteen) days.   lisinopril-hydrochlorothiazide 20-25 MG tablet Commonly known as: ZESTORETIC Take 1 tablet by mouth daily.   multivitamin tablet Take 1 tablet by mouth daily.   NON FORMULARY Gout Relief 1/2 oz daily   pravastatin 40 MG tablet Commonly known as: PRAVACHOL Take 1 tablet (40 mg total) by mouth daily.   SAFETY-GARD Needle 18G Misc Generic drug: Hypodermic Needles-Disposable 0.5 mLs by Does not apply route every 14 (fourteen) days.   sildenafil 20 MG tablet Commonly known as: REVATIO TAKE 1 TABLET BY MOUTH AS NEEDED, TAKE 3-5 TABLETS AS NEEDED PROIR TO INTERCOUSE   Syringe (Disposable) 3 ML Misc 0.5 mLs by Does not apply route every 14 (fourteen) days.   testosterone cypionate 200 MG/ML injection Commonly known as: DEPOTESTOSTERONE CYPIONATE Inject 0.5 cc every fourteen days       Allergies: No Known Allergies  Family History: Family History  Problem Relation Age of Onset   Hypertension Mother    Arthritis Mother    Vision loss Mother        due to age   Cancer Father        in the liver   Social History:  reports that he has never smoked. He has quit using smokeless tobacco.  His smokeless tobacco use included snuff. He reports current alcohol use of about 2.0 - 4.0 standard drinks per week. He reports that he does not use drugs.  For pertinent review of systems please refer to history of present illness  Physical Exam: BP (!) 194/68    Pulse 80    Ht 5\' 9"  (1.753 m)    Wt 197 lb (89.4 kg)    BMI 29.09 kg/m   Constitutional:  Well nourished. Alert and oriented, No acute distress. HEENT: [place] AT, mask in place.  Trachea midline Cardiovascular: No clubbing, cyanosis, or edema. Respiratory: Normal respiratory effort, no increased work of breathing. GU: No CVA tenderness.  No bladder fullness or masses.  Patient with circumcised phallus.  Urethral meatus is patent.  No  penile discharge. No penile lesions or rashes. Scrotum without lesions, cysts, rashes and/or edema.  Testicles are located scrotally bilaterally. No masses are appreciated in the testicles. Left and right epididymis are normal. Rectal: Patient with  normal sphincter  tone. Anus and perineum without scarring or rashes. No rectal masses are appreciated. Prostate is approximately 50 + grams, could only palpate the apex, no nodules are appreciated. Seminal vesicles could not be palpated Neurologic: Grossly intact, no focal deficits, moving all 4 extremities. Psychiatric: Normal mood and affect.   Laboratory Data: Component     Latest Ref Rng & Units 06/24/2018 02/16/2019 08/17/2019 11/26/2019  Prostate Specific Ag, Serum     0.0 - 4.0 ng/mL 1.6 2.0 1.8 1.8   Component     Latest Ref Rng & Units 03/29/2020 10/10/2020 04/10/2021 09/11/2021  Prostate Specific Ag, Serum     0.0 - 4.0 ng/mL 2.2 2.0 2.0 2.5   Lab Results  Component Value Date   TESTOSTERONE 420 09/11/2021   CBC Latest Ref Rng & Units 09/11/2021 04/10/2021 11/24/2020  WBC 4.0 - 10.5 K/uL - - 8.0  Hemoglobin 13.0 - 17.7 g/dL 14.8 15.9 14.9  Hematocrit 37.5 - 51.0 % 42.0 45.7 43.6  Platelets 150.0 - 400.0 K/uL - - 161.0  I have independently reviewed the labs.  Pertinent imaging N/A  Assessment & Plan:    1. Testosterone deficiency  - testosterone level is at therapeutic levels - He will continue his testosterone cypionate 200 mg/cc, 0.5 mg every 14 days-refills given   2. BPH with LUTS -PSA stable -DRE benign -UA benign -PVR < 300 cc -continue conservative management, avoiding bladder irritants and timed voiding's   3. Erectile dysfunction:    -Continue sildenafil 20 mg, on-demand dosing  Return in about 6 months (around 03/13/2022) for PSA, testosterone (one week after injection) H & H only .  Laneta Simmers  [facility] [address] [place], [place] [address] [phone]

## 2021-09-13 ENCOUNTER — Other Ambulatory Visit: Payer: Self-pay

## 2021-09-13 ENCOUNTER — Encounter: Payer: Self-pay | Admitting: Urology

## 2021-09-13 ENCOUNTER — Ambulatory Visit: Payer: Medicare Other | Admitting: Urology

## 2021-09-13 VITALS — BP 194/68 | HR 80 | Ht 69.0 in | Wt 197.0 lb

## 2021-09-13 DIAGNOSIS — N401 Enlarged prostate with lower urinary tract symptoms: Secondary | ICD-10-CM | POA: Diagnosis not present

## 2021-09-13 DIAGNOSIS — E349 Endocrine disorder, unspecified: Secondary | ICD-10-CM

## 2021-09-13 DIAGNOSIS — N138 Other obstructive and reflux uropathy: Secondary | ICD-10-CM

## 2021-09-13 DIAGNOSIS — N529 Male erectile dysfunction, unspecified: Secondary | ICD-10-CM

## 2021-09-13 MED ORDER — TESTOSTERONE CYPIONATE 200 MG/ML IM SOLN: INTRAMUSCULAR | 0 refills | Status: DC

## 2021-09-13 MED ORDER — SAFETY-GARD NEEDLE 18G MISC: 0.5000 mL | 5 refills | Status: AC

## 2021-09-13 MED ORDER — SYRINGE (DISPOSABLE) 3 ML MISC: 0.5000 mL | 5 refills | Status: DC

## 2021-09-13 MED ORDER — "BD SAFETYGLIDE SHIELDED NEEDLE 21G X 1-1/2"" MISC": 0.5000 mL | 5 refills | Status: DC

## 2021-09-17 ENCOUNTER — Other Ambulatory Visit: Payer: Self-pay | Admitting: Family Medicine

## 2021-09-17 DIAGNOSIS — M1A9XX1 Chronic gout, unspecified, with tophus (tophi): Secondary | ICD-10-CM

## 2021-09-20 ENCOUNTER — Ambulatory Visit (INDEPENDENT_AMBULATORY_CARE_PROVIDER_SITE_OTHER): Payer: Medicare Other

## 2021-09-20 DIAGNOSIS — Z0000 Encounter for general adult medical examination without abnormal findings: Secondary | ICD-10-CM

## 2021-09-20 NOTE — Patient Instructions (Addendum)
[name] , Thank you for taking time to come for your Medicare Wellness Visit. I appreciate your ongoing commitment to your health goals. Please review the following plan we discussed and let me know if I can assist you in the future.   Screening recommendations/referrals: Colonoscopy: 05/13/17 Recommended yearly ophthalmology/optometry visit for glaucoma screening and checkup Recommended yearly dental visit for hygiene and checkup  Vaccinations: Influenza vaccine: n/d Pneumococcal vaccine: 01/02/18 Tdap vaccine: 10/03/10, due Shingles vaccine: Zostavax 11/11/15   Covid-19: 09/26/19, 10/24/19, 06/14/20  Advanced directives: no  Conditions/risks identified: none  Next appointment: Follow up in one year for your annual wellness visit. - 09/24/22 @ 10:20am by phone  Preventive Care 65 Years and Older, Male Preventive care refers to lifestyle choices and visits with your health care provider that can promote health and wellness. What does preventive care include? A yearly physical exam. This is also called an annual well check. Dental exams once or twice a year. Routine eye exams. Ask your health care provider how often you should have your eyes checked. Personal lifestyle choices, including: Daily care of your teeth and gums. Regular physical activity. Eating a healthy diet. Avoiding tobacco and drug use. Limiting alcohol use. Practicing safe sex. Taking low doses of aspirin every day. Taking vitamin and mineral supplements as recommended by your health care provider. What happens during an annual well check? The services and screenings done by your health care provider during your annual well check will depend on your age, overall health, lifestyle risk factors, and family history of disease. Counseling  Your health care provider may ask you questions about your: Alcohol use. Tobacco use. Drug use. Emotional well-being. Home and relationship well-being. Sexual activity. Eating  habits. History of falls. Memory and ability to understand (cognition). Work and work Statistician. Screening  You may have the following tests or measurements: Height, weight, and BMI. Blood pressure. Lipid and cholesterol levels. These may be checked every 5 years, or more frequently if you are over 50 years old. Skin check. Lung cancer screening. You may have this screening every year starting at age 37 if you have a 30-pack-year history of smoking and currently smoke or have quit within the past 15 years. Fecal occult blood test (FOBT) of the stool. You may have this test every year starting at age 50. Flexible sigmoidoscopy or colonoscopy. You may have a sigmoidoscopy every 5 years or a colonoscopy every 10 years starting at age 56. Prostate cancer screening. Recommendations will vary depending on your family history and other risks. Hepatitis C blood test. Hepatitis B blood test. Sexually transmitted disease (STD) testing. Diabetes screening. This is done by checking your blood sugar (glucose) after you have not eaten for a while (fasting). You may have this done every 1-3 years. Abdominal aortic aneurysm (AAA) screening. You may need this if you are a current or former smoker. Osteoporosis. You may be screened starting at age 38 if you are at high risk. Talk with your health care provider about your test results, treatment options, and if necessary, the need for more tests. Vaccines  Your health care provider may recommend certain vaccines, such as: Influenza vaccine. This is recommended every year. Tetanus, diphtheria, and acellular pertussis (Tdap, Td) vaccine. You may need a Td booster every 10 years. Zoster vaccine. You may need this after age 67. Pneumococcal 13-valent conjugate (PCV13) vaccine. One dose is recommended after age 82. Pneumococcal polysaccharide (PPSV23) vaccine. One dose is recommended after age 83. Talk to your  health care provider about which screenings and  vaccines you need and how often you need them. This information is not intended to replace advice given to you by your health care provider. Make sure you discuss any questions you have with your health care provider. Document Released: 08/12/2015 Document Revised: 04/04/2016 Document Reviewed: 05/17/2015 Elsevier Interactive Patient Education  2017 [place] Prevention in the Home Falls can cause injuries. They can happen to people of all ages. There are many things you can do to make your home safe and to help prevent falls. What can I do on the outside of my home? Regularly fix the edges of walkways and driveways and fix any cracks. Remove anything that might make you trip as you walk through a door, such as a raised step or threshold. Trim any bushes or trees on the path to your home. Use bright outdoor lighting. Clear any walking paths of anything that might make someone trip, such as rocks or tools. Regularly check to see if handrails are loose or broken. Make sure that both sides of any steps have handrails. Any raised decks and porches should have guardrails on the edges. Have any leaves, snow, or ice cleared regularly. Use sand or salt on walking paths during winter. Clean up any spills in your garage right away. This includes oil or grease spills. What can I do in the bathroom? Use night lights. Install grab bars by the toilet and in the tub and shower. Do not use towel bars as grab bars. Use non-skid mats or decals in the tub or shower. If you need to sit down in the shower, use a plastic, non-slip stool. Keep the floor dry. Clean up any water that spills on the floor as soon as it happens. Remove soap buildup in the tub or shower regularly. Attach bath mats securely with double-sided non-slip rug tape. Do not have throw rugs and other things on the floor that can make you trip. What can I do in the bedroom? Use night lights. Make sure that you have a light by your  bed that is easy to reach. Do not use any sheets or blankets that are too big for your bed. They should not hang down onto the floor. Have a firm chair that has side arms. You can use this for support while you get dressed. Do not have throw rugs and other things on the floor that can make you trip. What can I do in the kitchen? Clean up any spills right away. Avoid walking on wet floors. Keep items that you use a lot in easy-to-reach places. If you need to reach something above you, use a strong step stool that has a grab bar. Keep electrical cords out of the way. Do not use floor polish or wax that makes floors slippery. If you must use wax, use non-skid floor wax. Do not have throw rugs and other things on the floor that can make you trip. What can I do with my stairs? Do not leave any items on the stairs. Make sure that there are handrails on both sides of the stairs and use them. Fix handrails that are broken or loose. Make sure that handrails are as long as the stairways. Check any carpeting to make sure that it is firmly attached to the stairs. Fix any carpet that is loose or worn. Avoid having throw rugs at the top or bottom of the stairs. If you do have throw rugs, attach them  to the floor with carpet tape. Make sure that you have a light switch at the top of the stairs and the bottom of the stairs. If you do not have them, ask someone to add them for you. What else can I do to help prevent falls? Wear shoes that: Do not have high heels. Have rubber bottoms. Are comfortable and fit you well. Are closed at the toe. Do not wear sandals. If you use a stepladder: Make sure that it is fully opened. Do not climb a closed stepladder. Make sure that both sides of the stepladder are locked into place. Ask someone to hold it for you, if possible. Clearly mark and make sure that you can see: Any grab bars or handrails. First and last steps. Where the edge of each step is. Use tools that  help you move around (mobility aids) if they are needed. These include: Canes. Walkers. Scooters. Crutches. Turn on the lights when you go into a [place] area. Replace any light bulbs as soon as they burn out. Set up your furniture so you have a clear path. Avoid moving your furniture around. If any of your floors are uneven, fix them. If there are any pets around you, be aware of where they are. Review your medicines with your doctor. Some medicines can make you feel dizzy. This can increase your chance of falling. Ask your doctor what other things that you can do to help prevent falls. This information is not intended to replace advice given to you by your health care provider. Make sure you discuss any questions you have with your health care provider. Document Released: 05/12/2009 Document Revised: 12/22/2015 Document Reviewed: 08/20/2014 Elsevier Interactive Patient Education  2017 [employer].

## 2021-09-20 NOTE — Progress Notes (Signed)
Virtual Visit via Telephone Note  I connected with  Curtis [name] on 09/20/21 at 10:20 AM EST by telephone and verified that I am speaking with the correct person using two identifiers.  Location: Patient: home Provider: [facility] Persons participating in the virtual visit: [place]   I discussed the limitations, risks, security and privacy concerns of performing an evaluation and management service by telephone and the availability of in person appointments. The patient expressed understanding and agreed to proceed.  Interactive audio and video telecommunications were attempted between this nurse and patient, however failed, due to patient having technical difficulties OR patient did not have access to video capability.  We continued and completed visit with audio only.  Some vital signs may be absent or patient reported.   Dionisio David, LPN  Subjective:   Curtis [name] is a 71 y.o. male who presents for Medicare Annual/Subsequent preventive examination.  Review of Systems           Objective:    There were no vitals filed for this visit. There is no height or weight on file to calculate BMI.  Advanced Directives 01/25/2020 01/06/2019 01/02/2018  Does Patient Have a Medical Advance Directive? Yes Yes Yes  Type of Paramedic of [place];Living will Living will;Healthcare Power of [place];Living will  Copy of [place] in Chart? No - copy requested No - copy requested No - copy requested    Current Medications (verified) Outpatient Encounter Medications as of 09/20/2021  Medication Sig   allopurinol (ZYLOPRIM) 300 MG tablet TAKE 1 TABLET(300 MG) BY MOUTH DAILY   amLODipine (NORVASC) 5 MG tablet Take 1 tablet (5 mg total) by mouth daily.   Hypodermic Needles-Disposable (SAFETY-GARD NEEDLE 18G) MISC 0.5 mLs by Does not apply route every 14 (fourteen) days.    lisinopril-hydrochlorothiazide (ZESTORETIC) 20-25 MG tablet Take 1 tablet by mouth daily.   Multiple Vitamin (MULTIVITAMIN) tablet Take 1 tablet by mouth daily.   NEEDLE, DISP, 21 G (BD SAFETYGLIDE SHIELDED NEEDLE) 21G X 1-1/2" MISC 0.5 mLs by Does not apply route every 14 (fourteen) days.   NON FORMULARY Gout Relief 1/2 oz daily   pravastatin (PRAVACHOL) 40 MG tablet Take 1 tablet (40 mg total) by mouth daily.   sildenafil (REVATIO) 20 MG tablet TAKE 1 TABLET BY MOUTH AS NEEDED, TAKE 3-5 TABLETS AS NEEDED PROIR TO INTERCOUSE   Syringe, Disposable, 3 ML MISC 0.5 mLs by Does not apply route every 14 (fourteen) days.   testosterone cypionate (DEPOTESTOSTERONE CYPIONATE) 200 MG/ML injection Inject 0.5 cc every fourteen days   No facility-administered encounter medications on file as of 09/20/2021.    Allergies (verified) Patient has no known allergies.   History: Past Medical History:  Diagnosis Date   Arthritis    Gout    HTN (hypertension)    Hx of dysplastic nevus 12/14/2019   L lower flank Moderate atypia   Hyperlipidemia    Hypogonadism in male    Low back pain    Morton neuroma, left    Past Surgical History:  Procedure Laterality Date   ACHILLES TENDON SURGERY Right 1995   HERNIA REPAIR  2002   double   Surgery on Left pinky in 2000     Family History  Problem Relation Age of Onset   Hypertension Mother    Arthritis Mother    Vision loss Mother        due to age   61  Father        in the liver   Social History   Socioeconomic History   Marital status: Married    Spouse name: Not on file   Number of children: 2   Years of education: Not on file   Highest education level: Bachelor's degree (e.g., BA, AB, BS)  Occupational History   Occupation: Camera operator    Comment: retired  Tobacco Use   Smoking status: Never   Smokeless tobacco: Former    Types: Snuff   Tobacco comments:    quit snuff at age 80  Vaping Use   Vaping Use: Never used   Substance and Sexual Activity   Alcohol use: Yes    Alcohol/week: 2.0 - 4.0 standard drinks    Types: 2 - 4 Cans of beer per week   Drug use: No   Sexual activity: Not on file  Other Topics Concern   Not on file  Social History Narrative   Not on file   Social Determinants of Health   Financial Resource Strain: Not on file  Food Insecurity: Not on file  Transportation Needs: Not on file  Physical Activity: Not on file  Stress: Not on file  Social Connections: Not on file    Tobacco Counseling Counseling given: Not Answered Tobacco comments: quit snuff at age 27   Clinical Intake:  Pre-visit preparation completed: Yes  Pain : No/denies pain     Nutritional Risks: None Diabetes: No  How often do you need to have someone help you when you read instructions, pamphlets, or other written materials from your doctor or pharmacy?: 1 - Never  Diabetic?no  Interpreter Needed?: No  Information entered by :: Kirke Shaggy, LPN   Activities of Daily Living In your present state of health, do you have any difficulty performing the following activities: 09/19/2021 08/24/2021  Hearing? N Y  Vision? N Y  Difficulty concentrating or making decisions? N Y  Walking or climbing stairs? N N  Dressing or bathing? N N  Doing errands, shopping? N N  Preparing Food and eating ? N -  Using the Toilet? N -  In the past six months, have you accidently leaked urine? N -  Do you have problems with loss of bowel control? N -  Managing your Medications? N -  Managing your Finances? N -  Housekeeping or managing your Housekeeping? N -  Some recent data might be hidden    Patient Care Team: Brita Romp Dionne Bucy, MD as PCP - General (Family Medicine) Laneta Simmers as Physician Assistant (Urology) Sharlotte Alamo, [place] (Podiatry) Coll, Venda Rodes., PA-C (Orthopedic Surgery) Brendolyn Patty, MD (Dermatology) Pa, [place] [facility])  Indicate any recent Medical  Services you may have received from other than [facility] providers in the past year (date may be approximate).     Assessment:   This is a routine wellness examination for Curtis [name].  Hearing/Vision screen No results found.  Dietary issues and exercise activities discussed:     Goals Addressed   None    Depression Screen PHQ 2/9 Scores 08/24/2021 06/05/2021 01/25/2020 01/06/2019 01/06/2019 01/02/2018 01/01/2017  PHQ - 2 Score 0 0 0 0 0 0 0  PHQ- 9 Score - - - 0 - - 0    Fall Risk Fall Risk  09/19/2021 08/24/2021 06/05/2021 01/25/2020 01/06/2019  Falls in the past year? 0 0 0 0 0  Number falls in past yr: - 0 0 0 -  Injury with  Fall? - 0 0 0 -  Risk for fall due to : - - No Fall Risks - -    FALL RISK PREVENTION PERTAINING TO THE HOME:  Any stairs in or around the home? No  If so, are there any without handrails? No  Home free of loose throw rugs in walkways, pet beds, electrical cords, etc? Yes  Adequate lighting in your home to reduce risk of falls? Yes   ASSISTIVE DEVICES UTILIZED TO PREVENT FALLS:  Life alert? No  Use of a cane, walker or w/c? No  Grab bars in the bathroom? Yes  Shower chair or bench in shower? Yes  Elevated toilet seat or a handicapped toilet? Yes    Cognitive Function:    6CIT Screen 01/25/2020 01/06/2019 01/02/2018  What Year? 0 points 0 points 0 points  What month? 0 points 0 points 0 points  What time? 0 points 0 points 0 points  Count back from 20 0 points 0 points 0 points  Months in reverse 0 points 0 points 0 points  Repeat phrase 0 points 0 points 4 points  Total Score 0 0 4    Immunizations Immunization History  Administered Date(s) Administered   Fluad Quad(high Dose 65+) 04/14/2019, 05/12/2020   Influenza-Unspecified 05/07/2017, 04/25/2018   Moderna Sars-Covid-2 Vaccination 09/26/2019, 10/24/2019, 06/14/2020   Pneumococcal Conjugate-13 01/01/2017   Pneumococcal Polysaccharide-23 01/02/2018   Td 08/06/2001   Tdap 10/03/2010   Zoster, Live  11/11/2015    TDAP status: Due, Education has been provided regarding the importance of this vaccine. Advised may receive this vaccine at local pharmacy or Health Dept. Aware to provide a copy of the vaccination record if obtained from local pharmacy or Health Dept. Verbalized acceptance and understanding.  Flu Vaccine status: Declined, Education has been provided regarding the importance of this vaccine but patient still declined. Advised may receive this vaccine at local pharmacy or Health Dept. Aware to provide a copy of the vaccination record if obtained from local pharmacy or Health Dept. Verbalized acceptance and understanding.  Pneumococcal vaccine status: Declined,  Education has been provided regarding the importance of this vaccine but patient still declined. Advised may receive this vaccine at local pharmacy or Health Dept. Aware to provide a copy of the vaccination record if obtained from local pharmacy or Health Dept. Verbalized acceptance and understanding.   Covid-19 vaccine status: Completed vaccines  Qualifies for Shingles Vaccine? Yes   Zostavax completed Yes   Shingrix Completed?: No.    Education has been provided regarding the importance of this vaccine. Patient has been advised to call insurance company to determine out of pocket expense if they have not yet received this vaccine. Advised may also receive vaccine at local pharmacy or Health Dept. Verbalized acceptance and understanding.  Screening Tests Health Maintenance  Topic Date Due   Zoster Vaccines- Shingrix (1 of 2) Never done   COVID-19 Vaccine (4 - Booster for Moderna series) 08/09/2020   TETANUS/TDAP  10/02/2020   INFLUENZA VACCINE  10/27/2021 (Originally 02/27/2021)   COLONOSCOPY (Pts 45-69yrs Insurance coverage will need to be confirmed)  05/14/2027   Pneumonia Vaccine 36+ Years old  Completed   Hepatitis C Screening  Completed   HPV VACCINES  Aged Out    Health Maintenance  Health Maintenance Due   Topic Date Due   Zoster Vaccines- Shingrix (1 of 2) Never done   COVID-19 Vaccine (4 - Booster for Moderna series) 08/09/2020   TETANUS/TDAP  10/02/2020    Colorectal cancer  screening: Type of screening: Colonoscopy. Completed 05/13/17. Repeat every 10 years  Lung Cancer Screening: (Low Dose CT Chest recommended if Age 52-80 years, 30 pack-year currently smoking OR have quit w/in 15years.) does not qualify.   Additional Screening:  Hepatitis C Screening: does qualify; Completed 11/27/12  Vision Screening: Recommended annual ophthalmology exams for early detection of glaucoma and other disorders of the eye. Is the patient up to date with their annual eye exam?  Yes  Who is the provider or what is the name of the office in which the patient attends annual eye exams? [facility] If pt is not established with a provider, would they like to be referred to a provider to establish care? No .   Dental Screening: Recommended annual dental exams for proper oral hygiene  Community Resource Referral / Chronic Care Management: CRR required this visit?  No   CCM required this visit?  No      Plan:     I have personally reviewed and noted the following in the patients chart:   Medical and social history Use of alcohol, tobacco or illicit drugs  Current medications and supplements including opioid prescriptions. Patient is not currently taking opioid prescriptions. Functional ability and status Nutritional status Physical activity Advanced directives List of other physicians Hospitalizations, surgeries, and ER visits in previous 12 months Vitals Screenings to include cognitive, depression, and falls Referrals and appointments  In addition, I have reviewed and discussed with patient certain preventive protocols, quality metrics, and best practice recommendations. A written personalized care plan for preventive services as well as general preventive health recommendations were  provided to patient.     Dionisio David, LPN   [id number]/[id number]   Nurse Notes: none

## 2021-09-22 NOTE — Progress Notes (Signed)
Curtis [name] [place] [address] [place] Phone: [phone] Subjective:   IVilma Meckel, am serving as a scribe for Dr. Hulan Saas. This visit occurred during the SARS-CoV-2 public health emergency.  Safety protocols were in place, including screening questions prior to the visit, additional usage of staff PPE, and extensive cleaning of exam room while observing appropriate contact time as indicated for disinfecting solutions.   I'm seeing this patient by the request  of:  Virginia Crews, MD  CC: Low back pain  NIO:[id number]  08/23/2021 Due to the elevation in the blood pressure will start on a low-dose of amlodipine.  Patient is to be seeing his new primary care provider in the near future.  It appears in the chart that patient is not scheduled till May.  Patient does have any chest pain, shortness of breath, arm pain needs to seek medical attention.  We will be checking him again in 4 weeks otherwise we will see if we need to increase the amlodipine.  Patient does have degenerative disc disease.  I do think the patient does have some underlying lumbar arthritis that could be contributing.  Patient on exam though did not have anything strongly out of place I do not feel that that would make a significant improvement at this time.  Patient has had those some elevated blood pressure and we will start him on a low-dose of amlodipine.  Warned of potential side effects but likely will be okay.  Patient is going to be seeing a new primary care provider and we will send note as well.  Discussed with patient icing regimen and home exercises.  Follow-up with me again in 4 to 5 weeks.  Patient did work with Product/process development scientist.  Worsening pain consider advanced imaging in the possibility of formal physical therapy  Updated 09/25/2021 Curtis [name] is a 71 y.o. male coming in with complaint of back pain. Little better, but about the same. Quad stretch  helps a lot.  Patient continues on the making significant progress.  Patient states everything he has been doing for the last 5 years has been evaluated to stay above 4.  Continues to have significant discomfort and pain.  Tries to do everything naturally but unfortunately now starting to affect daily activities.  Concerning about this as well as his increasing blood pressure.  Xray IMPRESSION: Mild L3-4 through L5-S1 degenerative disc and endplate changes, similar to prior.     Past Medical History:  Diagnosis Date   Arthritis    Gout    HTN (hypertension)    Hx of dysplastic nevus 12/14/2019   L lower flank Moderate atypia   Hyperlipidemia    Hypogonadism in male    Low back pain    Morton neuroma, left    Past Surgical History:  Procedure Laterality Date   ACHILLES TENDON SURGERY Right 1995   HERNIA REPAIR  2002   double   Surgery on Left pinky in 2000     Social History   Socioeconomic History   Marital status: Married    Spouse name: Not on file   Number of children: 2   Years of education: Not on file   Highest education level: Bachelor's degree (e.g., BA, AB, BS)  Occupational History   Occupation: Camera operator    Comment: retired  Tobacco Use   Smoking status: Never   Smokeless tobacco: Former    Types: Snuff   Tobacco comments:  quit snuff at age 51  Vaping Use   Vaping Use: Never used  Substance and Sexual Activity   Alcohol use: Yes    Alcohol/week: 2.0 - 4.0 standard drinks    Types: 2 - 4 Cans of beer per week   Drug use: No   Sexual activity: Not on file  Other Topics Concern   Not on file  Social History Narrative   Not on file   Social Determinants of Health   Financial Resource Strain: Low Risk    Difficulty of Paying Living Expenses: Not hard at all  Food Insecurity: No Food Insecurity   Worried About Charity fundraiser in the Last Year: Never true   Ran Out of Food in the Last Year: Never true  Transportation Needs: No  Transportation Needs   Lack of Transportation (Medical): No   Lack of Transportation (Non-Medical): No  Physical Activity: Sufficiently Active   Days of Exercise per Week: 4 days   Minutes of Exercise per Session: 60 min  Stress: No Stress Concern Present   Feeling of Stress : Not at all  Social Connections: Moderately Integrated   Frequency of Communication with Friends and Family: More than three times a week   Frequency of Social Gatherings with Friends and Family: More than three times a week   Attends Religious Services: More than 4 times per year   Active Member of [employer] or Organizations: No   Attends Music therapist: Never   Marital Status: Married   No Known Allergies Family History  Problem Relation Age of Onset   Hypertension Mother    Arthritis Mother    Vision loss Mother        due to age   Cancer Father        in the liver    Current Outpatient Medications (Endocrine & Metabolic):    testosterone cypionate (DEPOTESTOSTERONE CYPIONATE) 200 MG/ML injection, Inject 0.5 cc every fourteen days  Current Outpatient Medications (Cardiovascular):    amLODipine (NORVASC) 5 MG tablet, Take 1 tablet (5 mg total) by mouth daily.   lisinopril-hydrochlorothiazide (ZESTORETIC) 20-25 MG tablet, Take 1 tablet by mouth daily.   pravastatin (PRAVACHOL) 40 MG tablet, Take 1 tablet (40 mg total) by mouth daily.   sildenafil (REVATIO) 20 MG tablet, TAKE 1 TABLET BY MOUTH AS NEEDED, TAKE 3-5 TABLETS AS NEEDED PROIR TO INTERCOUSE   Current Outpatient Medications (Analgesics):    allopurinol (ZYLOPRIM) 300 MG tablet, TAKE 1 TABLET(300 MG) BY MOUTH DAILY   Current Outpatient Medications (Other):    Hypodermic Needles-Disposable (SAFETY-GARD NEEDLE 18G) MISC, 0.5 mLs by Does not apply route every 14 (fourteen) days.   Multiple Vitamin (MULTIVITAMIN) tablet, Take 1 tablet by mouth daily.   NEEDLE, DISP, 21 G (BD SAFETYGLIDE SHIELDED NEEDLE) 21G X 1-1/2" MISC, 0.5 mLs by  Does not apply route every 14 (fourteen) days.   NON FORMULARY, Gout Relief 1/2 oz daily   Syringe, Disposable, 3 ML MISC, 0.5 mLs by Does not apply route every 14 (fourteen) days.   Reviewed prior external information including notes and imaging from  primary care provider As well as notes that were available from care everywhere and other healthcare systems.  Past medical history, social, surgical and family history all reviewed in electronic medical record.  No pertanent information unless stated regarding to the chief complaint.   Review of Systems:  No headache, visual changes, nausea, vomiting, diarrhea, constipation, dizziness, abdominal pain, skin rash, fevers, chills, night sweats,  weight loss, swollen lymph nodes, body aches, joint swelling, chest pain, shortness of breath, mood changes. POSITIVE muscle aches  Objective  Blood pressure (!) 160/60, pulse 77, height 5\' 9"  (1.753 m), weight 207 lb (93.9 kg), SpO2 97 %.   General: No apparent distress alert and oriented x3 mood and affect normal, dressed appropriately.  HEENT: Pupils equal, extraocular movements intact  Respiratory: Patient's speak in full sentences and does not appear short of breath  Cardiovascular: No lower extremity edema, non tender, no erythema  Gait normal with good balance and coordination.  MSK: Low back exam does have loss of lordosis.  Tightness with straight leg test.  Worsening pain with extension of the back and legs approximately 10 degrees of extension.  This is worse than previous exam.  Significant tightness with straight leg test noted.    Impression and Recommendations:     The above documentation has been reviewed and is accurate and complete Lyndal Pulley, DO

## 2021-09-25 ENCOUNTER — Ambulatory Visit: Payer: Medicare Other | Admitting: Family Medicine

## 2021-09-25 ENCOUNTER — Other Ambulatory Visit: Payer: Self-pay

## 2021-09-25 VITALS — BP 160/60 | HR 77 | Ht 69.0 in | Wt 207.0 lb

## 2021-09-25 DIAGNOSIS — M5136 Other intervertebral disc degeneration, lumbar region: Secondary | ICD-10-CM

## 2021-09-25 NOTE — Assessment & Plan Note (Signed)
Significant arthritic changes noted.  Previously.  X-rays only showed mild arthritic changes of the L3-S1 but patient is having worsening pain.  Mild radicular symptoms.  Seems to be going down the quadricep.  Patient is also having difficulty with his blood pressure fairly significantly.  I do feel that an MRI is necessary at this point.  Failed all conservative therapy including 5 years of massage, multiple rounds of physical therapy, oral medications with no improvement.

## 2021-09-25 NOTE — Patient Instructions (Signed)
[facility] [phone] Call Today  When we receive your results we will contact you.  

## 2021-09-26 ENCOUNTER — Other Ambulatory Visit: Payer: Self-pay

## 2021-09-26 ENCOUNTER — Encounter: Payer: Self-pay | Admitting: Family Medicine

## 2021-09-26 ENCOUNTER — Ambulatory Visit (INDEPENDENT_AMBULATORY_CARE_PROVIDER_SITE_OTHER): Payer: Medicare Other | Admitting: Family Medicine

## 2021-09-26 VITALS — BP 154/70 | HR 68 | Temp 97.7°F | Resp 16 | Wt 204.0 lb

## 2021-09-26 DIAGNOSIS — I10 Essential (primary) hypertension: Secondary | ICD-10-CM

## 2021-09-26 MED ORDER — LISINOPRIL 20 MG PO TABS: 20.0000 mg | ORAL_TABLET | Freq: Every day | ORAL | 3 refills | Status: DC

## 2021-09-26 NOTE — Assessment & Plan Note (Signed)
Chronic and uncontrolled Asymptomatic Continue amlodipine 5mg  daily, hctz 25mg  daily Increase lisinopril to 40 mg daily Recheck BMP in 1 wk Discussed DASH diet F/u in 4 wks and consider further dose titration

## 2021-09-26 NOTE — Patient Instructions (Signed)
Add extra lisinopril 20 mg daily to current meds

## 2021-09-26 NOTE — Progress Notes (Signed)
I,Sulibeya S Dimas,acting as a scribe for Lavon Paganini, MD.,have documented all relevant documentation on the behalf of Lavon Paganini, MD,as directed by  Lavon Paganini, MD while in the presence of Lavon Paganini, MD.   Established patient visit   Patient: Curtis [name]   DOB: [birth year]   70 y.o. Male  MRN: [id number] Visit Date: 09/26/2021  Today's healthcare provider: Lavon Paganini, MD   Chief Complaint  Patient presents with   Follow-up   Subjective    HPI  Follow up for hypertension  The patient was last seen for this 1 months ago. Changes made at last visit include continue lisinopril-hctz 20-25mg  daily and increase amlodipine to 5mg  daily.  He reports excellent compliance with treatment. He feels that condition is Unchanged. Patient reports bp readings are still elevated. He is having side effects. Urine flow is slower. Patient denies chest pain, shortness of breath, headaches, or edema. -----------------------------------------------------------------------------------------   Medications: Outpatient Medications Prior to Visit  Medication Sig   allopurinol (ZYLOPRIM) 300 MG tablet TAKE 1 TABLET(300 MG) BY MOUTH DAILY   amLODipine (NORVASC) 5 MG tablet Take 1 tablet (5 mg total) by mouth daily.   Hypodermic Needles-Disposable (SAFETY-GARD NEEDLE 18G) MISC 0.5 mLs by Does not apply route every 14 (fourteen) days.   lisinopril-hydrochlorothiazide (ZESTORETIC) 20-25 MG tablet Take 1 tablet by mouth daily.   Multiple Vitamin (MULTIVITAMIN) tablet Take 1 tablet by mouth daily.   NEEDLE, DISP, 21 G (BD SAFETYGLIDE SHIELDED NEEDLE) 21G X 1-1/2" MISC 0.5 mLs by Does not apply route every 14 (fourteen) days.   NON FORMULARY Gout Relief 1/2 oz daily   pravastatin (PRAVACHOL) 40 MG tablet Take 1 tablet (40 mg total) by mouth daily.   sildenafil (REVATIO) 20 MG tablet TAKE 1 TABLET BY MOUTH AS NEEDED, TAKE 3-5 TABLETS AS NEEDED PROIR TO INTERCOUSE    Syringe, Disposable, 3 ML MISC 0.5 mLs by Does not apply route every 14 (fourteen) days.   testosterone cypionate (DEPOTESTOSTERONE CYPIONATE) 200 MG/ML injection Inject 0.5 cc every fourteen days   No facility-administered medications prior to visit.    Review of Systems  Constitutional:  Negative for appetite change and fatigue.  Eyes:  Negative for visual disturbance.  Respiratory:  Negative for shortness of breath.   Cardiovascular:  Negative for chest pain, palpitations and leg swelling.  Neurological:  Negative for headaches.      Objective    BP (!) 154/70 (BP Location: Left Arm, Patient Position: Sitting, Cuff Size: Large)    Pulse 68    Temp 97.7 F (36.5 C) (Temporal)    Resp 16    Wt 204 lb (92.5 kg)    HC 16" (40.6 cm)    SpO2 97%    BMI 30.13 kg/m    Physical Exam Vitals reviewed.  Constitutional:      General: He is not in acute distress.    Appearance: Normal appearance. He is not diaphoretic.  HENT:     Head: Normocephalic and atraumatic.  Eyes:     General: No scleral icterus.    Conjunctiva/sclera: Conjunctivae normal.  Cardiovascular:     Rate and Rhythm: Normal rate and regular rhythm.     Pulses: Normal pulses.     Heart sounds: Normal heart sounds. No murmur heard. Pulmonary:     Effort: Pulmonary effort is normal. No respiratory distress.     Breath sounds: Normal breath sounds. No wheezing or rhonchi.  Abdominal:     General: There is  no distension.     Palpations: Abdomen is soft.     Tenderness: There is no abdominal tenderness.  Musculoskeletal:     Cervical back: Neck supple.     Right lower leg: No edema.     Left lower leg: No edema.  Lymphadenopathy:     Cervical: No cervical adenopathy.  Skin:    General: Skin is warm and dry.     Capillary Refill: Capillary refill takes less than 2 seconds.     Findings: No rash.  Neurological:     Mental Status: He is alert and oriented to person, place, and time.     Cranial Nerves: No cranial  nerve deficit.  Psychiatric:        Mood and Affect: Mood normal.        Behavior: Behavior normal.      No results found for any visits on 09/26/21.  Assessment & Plan     Problem List Items Addressed This Visit       Cardiovascular and Mediastinum   Essential hypertension - Primary    Chronic and uncontrolled Asymptomatic Continue amlodipine 5mg  daily, hctz 25mg  daily Increase lisinopril to 40 mg daily Recheck BMP in 1 wk Discussed DASH diet F/u in 4 wks and consider further dose titration      Relevant Medications   lisinopril (ZESTRIL) 20 MG tablet   Other Relevant Orders   Basic Metabolic Panel (BMET)     Return in about 4 weeks (around 10/24/2021) for BP f/u.      I, Lavon Paganini, MD, have reviewed all documentation for this visit. The documentation on 09/26/21 for the exam, diagnosis, procedures, and orders are all accurate and complete.   Reyann Troop, Dionne Bucy, MD, MPH [facility]

## 2021-09-28 ENCOUNTER — Other Ambulatory Visit: Payer: Self-pay | Admitting: Family Medicine

## 2021-09-29 MED ORDER — AMLODIPINE BESYLATE 5 MG PO TABS: 5.0000 mg | ORAL_TABLET | Freq: Every day | ORAL | 1 refills | Status: DC

## 2021-10-02 ENCOUNTER — Encounter: Payer: Self-pay | Admitting: Family Medicine

## 2021-10-02 ENCOUNTER — Ambulatory Visit
Admission: RE | Admit: 2021-10-02 | Discharge: 2021-10-02 | Disposition: A | Discharge status: Home / Self Care | Payer: Medicare Other | Source: Ambulatory Visit | Attending: Family Medicine | Admitting: Family Medicine

## 2021-10-02 DIAGNOSIS — M5136 Other intervertebral disc degeneration, lumbar region: Secondary | ICD-10-CM

## 2021-10-02 IMAGING — MR MR LUMBAR SPINE W/O CM
4 of 5 series · 18 of 48 positions shown · non-contrast
Comparison: Radiography [DATE]

CLINICAL DATA: Low back pain, symptoms persist with greater than 6
weeks treatment. Low back pain in the right central region. Some
radiation to the right lower extremity.

EXAM:
MRI LUMBAR SPINE WITHOUT CONTRAST
TECHNIQUE: Multiplanar, multisequence MR imaging of the lumbar spine was
performed. No intravenous contrast was administered.

[Series 2: T2 · sagittal · 4.0mm · 0.73mm/px · 6 of 16 slices shown (1 of 2)]
[im 1/16]
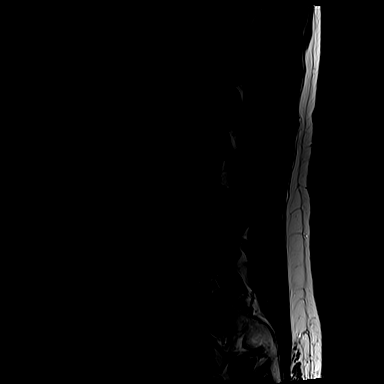
[im 4/16]
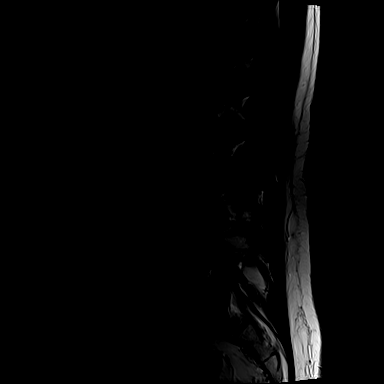
[im 7/16]
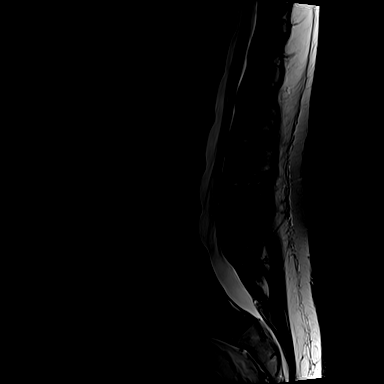
[im 10/16]
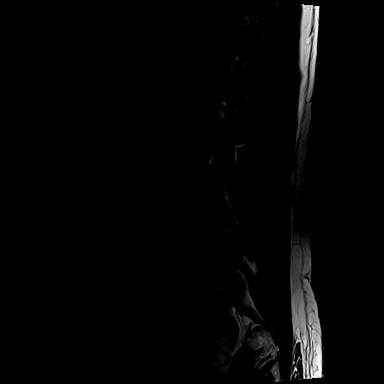
[im 13/16]
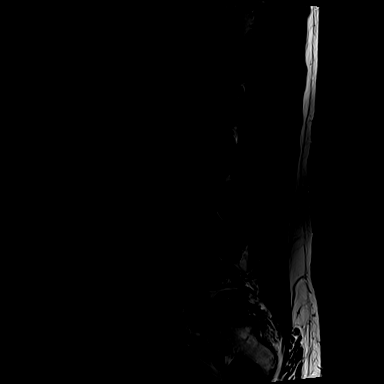
[im 16/16]
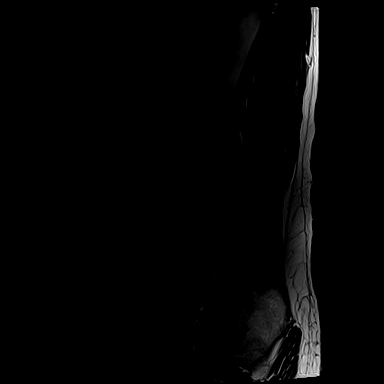

[Series 3: T1 · sagittal · 4.0mm · 0.73mm/px · 3 of 16 slices shown (1 of 2)]
[im 1/16]
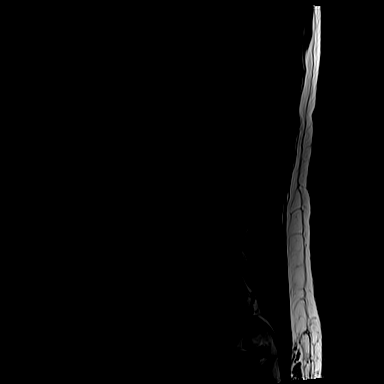
[im 8/16]
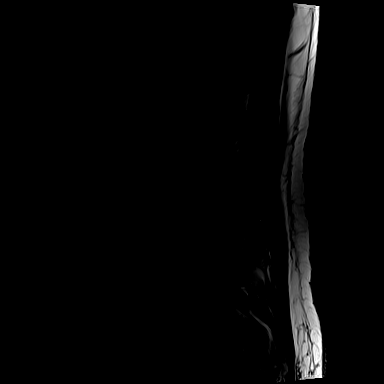
[im 16/16]
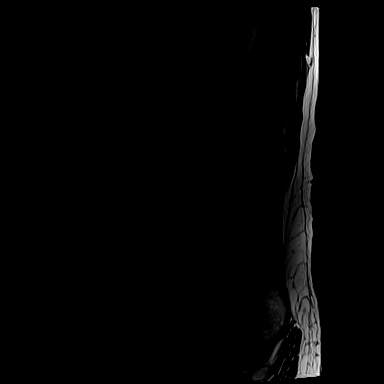

[Series 9: T2 · axial · 4.0mm · 0.28mm/px · z∈[-74,+108]mm · 6 of 46 slices shown (2 of 2)]
[im 4/46]
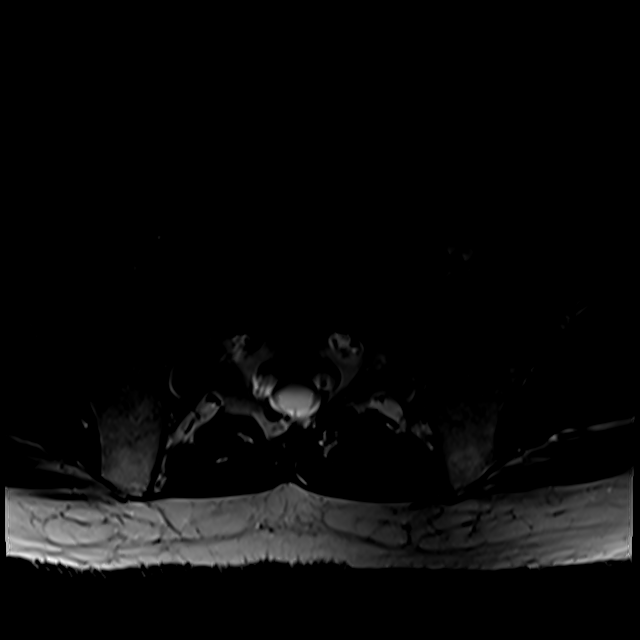
[im 7/46]
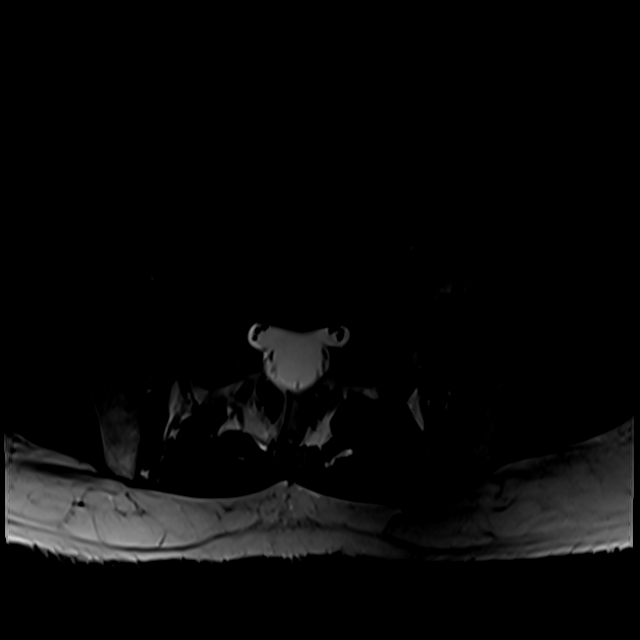
[im 10/46]
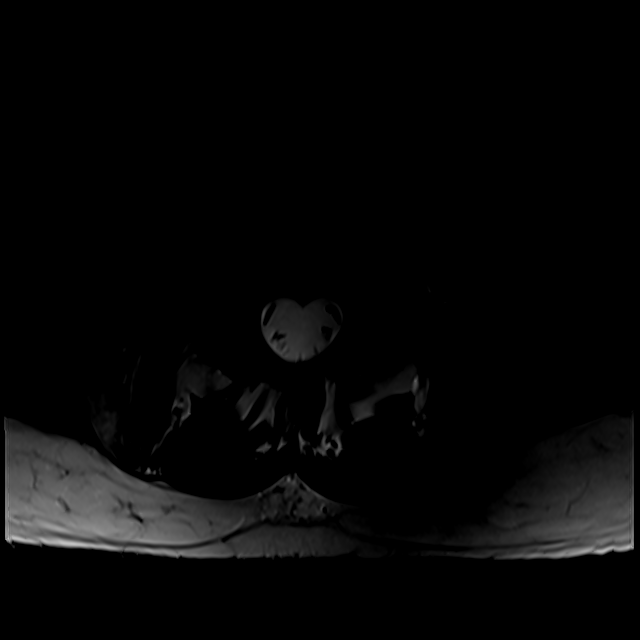
[im 16/46]
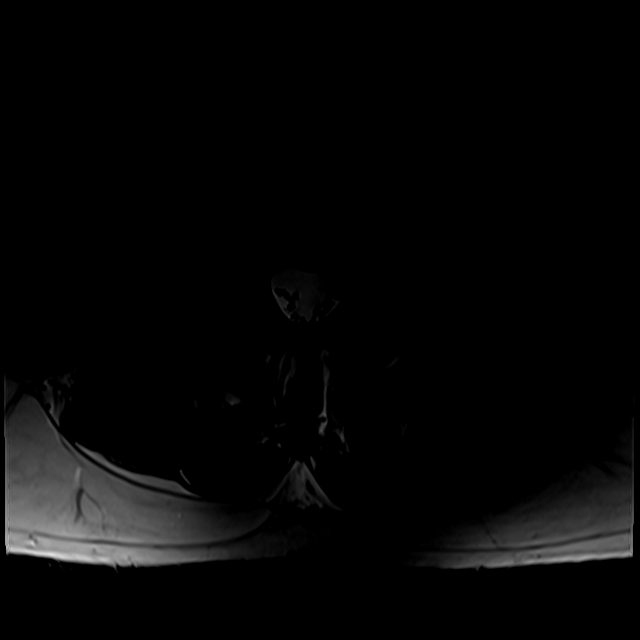
[im 25/46]
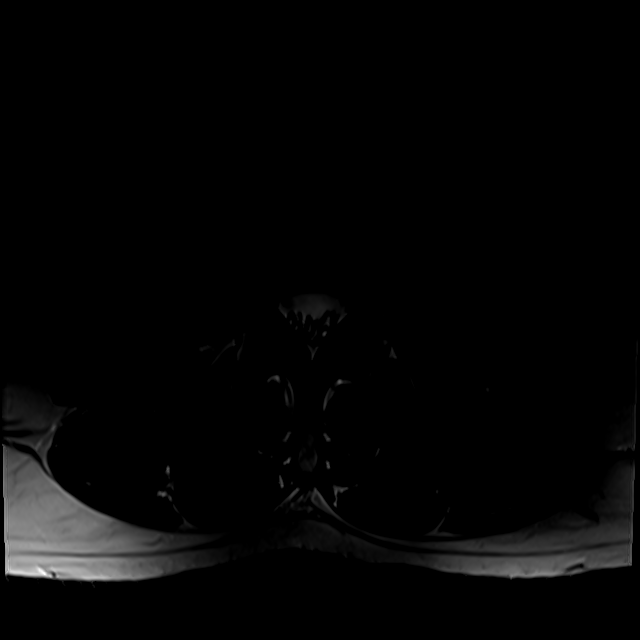
[im 40/46]
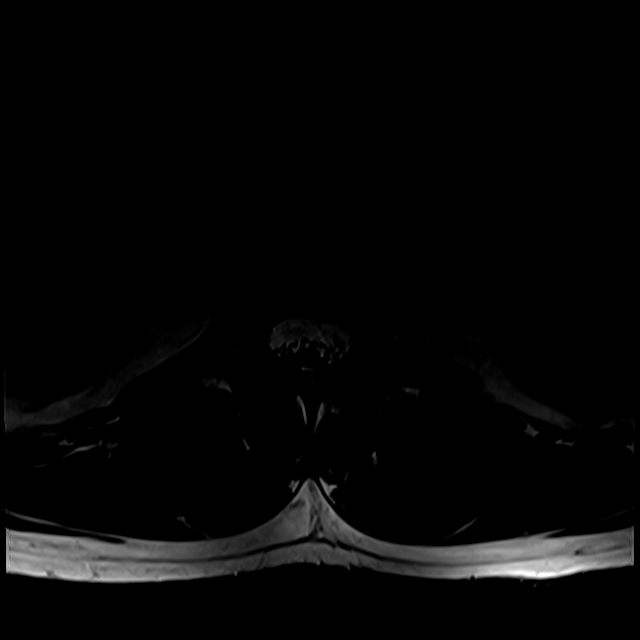

[Series 101: T1 · axial · 4.0mm · 0.28mm/px · z∈[-59,+108]mm · 3 of 46 slices shown (2 of 2)]
[im 7/46]
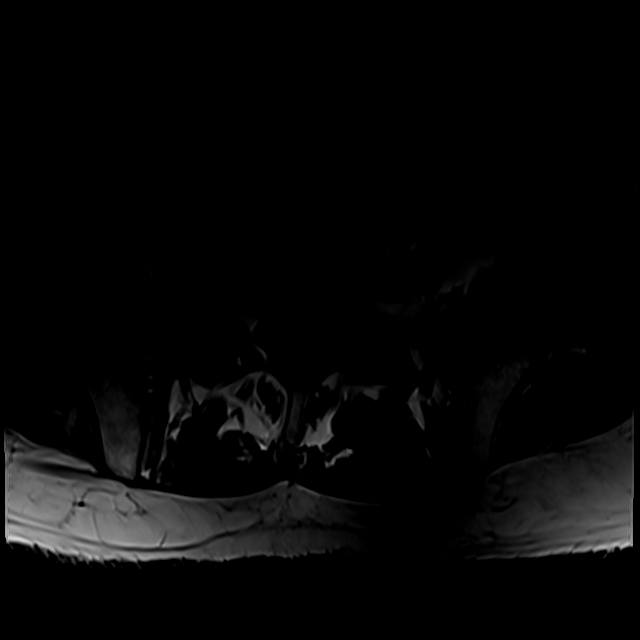
[im 25/46]
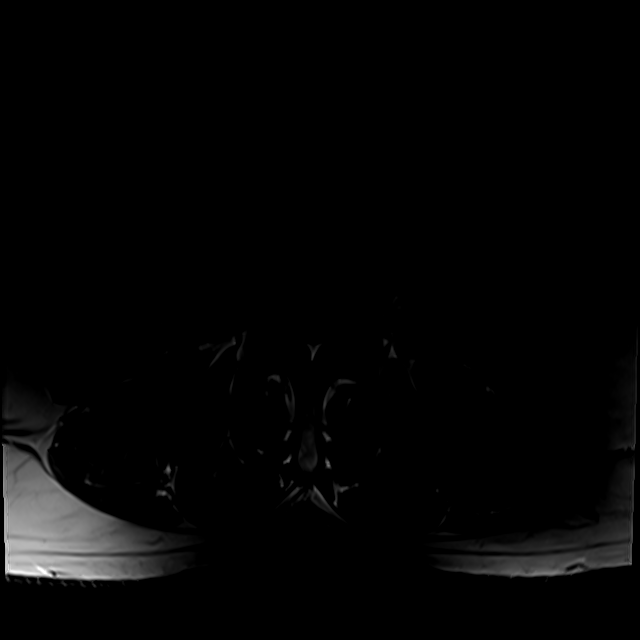
[im 40/46]
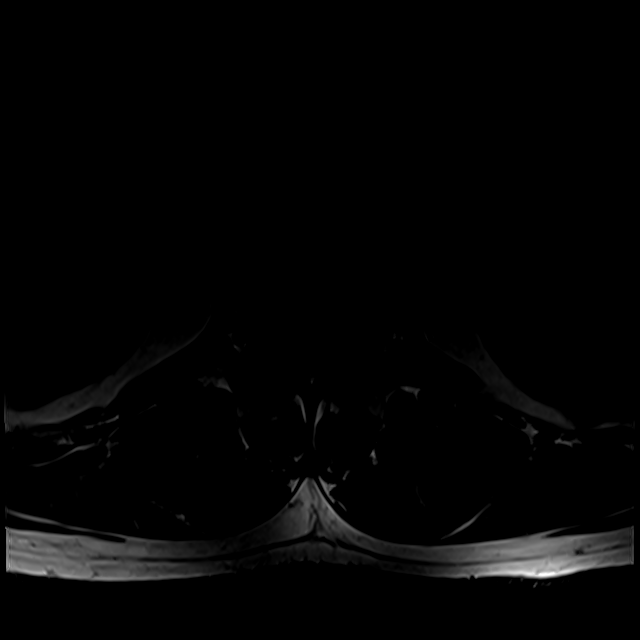

[18 of 48 positions shown; findings below may reference images not displayed]

FINDINGS: Segmentation:  5 lumbar type vertebral bodies.

Alignment:  3 mm degenerative anterolisthesis L4-5.

Vertebrae: Focal T1 dark T2 bright region in the posteroinferior L3
vertebral body, probably discogenic in nature related to a small
endplate Schmorl's node. This could relate to regional back pain.

Conus medullaris and cauda equina: Conus extends to the L1 level.
Conus and cauda equina appear normal.

Paraspinal and other soft tissues: Negative

Disc levels:

Minimal non-compressive disc bulges at L2-3 and above.

L3-4: More advanced disc degeneration with loss of disc height and
bulging of the disc. Minimal facet and ligamentous prominence. Mild
stenosis of the lateral recesses and foramina but without visible
neural compression. As noted above, there is abnormal edema at the
posteroinferior endplate region of L3 which is probably discogenic,
but could relate to regional pain.

L4-5: Advanced bilateral facet arthropathy with small joint
effusions and edematous changes. 3 mm synovial cyst projecting
inward from the facet on the left. Degenerative anterolisthesis of 3
mm because of this. Mild bulging of the disc. Mild stenosis of the
lateral recesses, left more than right, which could worsen with
standing or flexion findings could relate to back pain or referred
facet syndrome pain.

L5-S1: Normal appearance of the disc. No facet arthropathy. No
stenosis.
IMPRESSION: L4-5 facet osteoarthritis with 3 mm of anterolisthesis, which could
worsen with standing or flexion. Small facet joint effusions with
facet joint edema and a 3 mm synovial cyst projecting inward from
the facet on the left. Narrowing of the lateral recesses left more
than right that could be symptomatic and could worsen with standing
or flexion. The facet arthropathy could be a cause of back pain or
referred facet syndrome pain.

L3-4 disc degeneration with mild bulging of the disc. Mild narrowing
of the lateral recesses and foramina but without visible neural
compression. Abnormal marrow edema in the posteroinferior L3
vertebral body, probably discogenic in nature, which could
contribute to regional pain.

## 2021-10-03 ENCOUNTER — Other Ambulatory Visit: Payer: Self-pay

## 2021-10-03 DIAGNOSIS — M5416 Radiculopathy, lumbar region: Secondary | ICD-10-CM

## 2021-10-05 LAB — BASIC METABOLIC PANEL
BUN/Creatinine Ratio: 21 (ref 10–24)
BUN: 19 mg/dL (ref 8–27)
CO2: 26 mmol/L (ref 20–29)
Calcium: 9.2 mg/dL (ref 8.6–10.2)
Chloride: 104 mmol/L (ref 96–106)
Creatinine, Ser: 0.9 mg/dL (ref 0.76–1.27)
Glucose: 115 mg/dL — ABNORMAL HIGH (ref 70–99)
Potassium: 4.2 mmol/L (ref 3.5–5.2)
Sodium: 142 mmol/L (ref 134–144)
eGFR: 92 mL/min/{1.73_m2} (ref >59)

## 2021-10-06 ENCOUNTER — Ambulatory Visit
Admission: RE | Admit: 2021-10-06 | Discharge: 2021-10-06 | Disposition: A | Discharge status: Home / Self Care | Payer: Medicare Other | Source: Ambulatory Visit | Attending: Family Medicine | Admitting: Family Medicine

## 2021-10-06 ENCOUNTER — Other Ambulatory Visit: Payer: Self-pay

## 2021-10-06 DIAGNOSIS — M5416 Radiculopathy, lumbar region: Secondary | ICD-10-CM

## 2021-10-06 IMAGING — XA Imaging study
2 series · 2 of 2 positions shown · non-contrast
Comparison: none

CLINICAL DATA: Lumbosacral spondylosis without myelopathy. Central
low back pain. No radicular complaints.

[Series 1: ortho standard · 1 of 1 slices shown (1 of 2)]
[im 1/1]
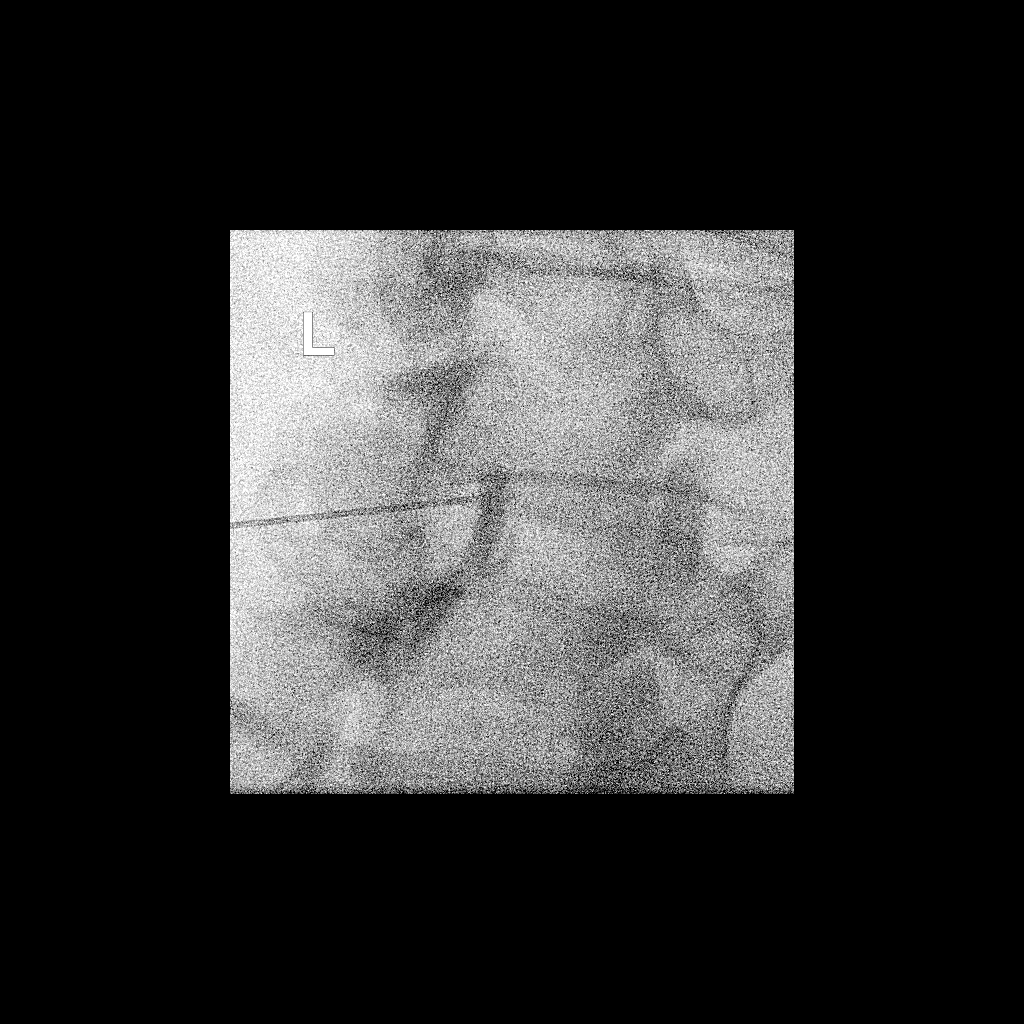

[Series 2: ortho standard · 1 of 1 slices shown (2 of 2)]
[im 1/1]
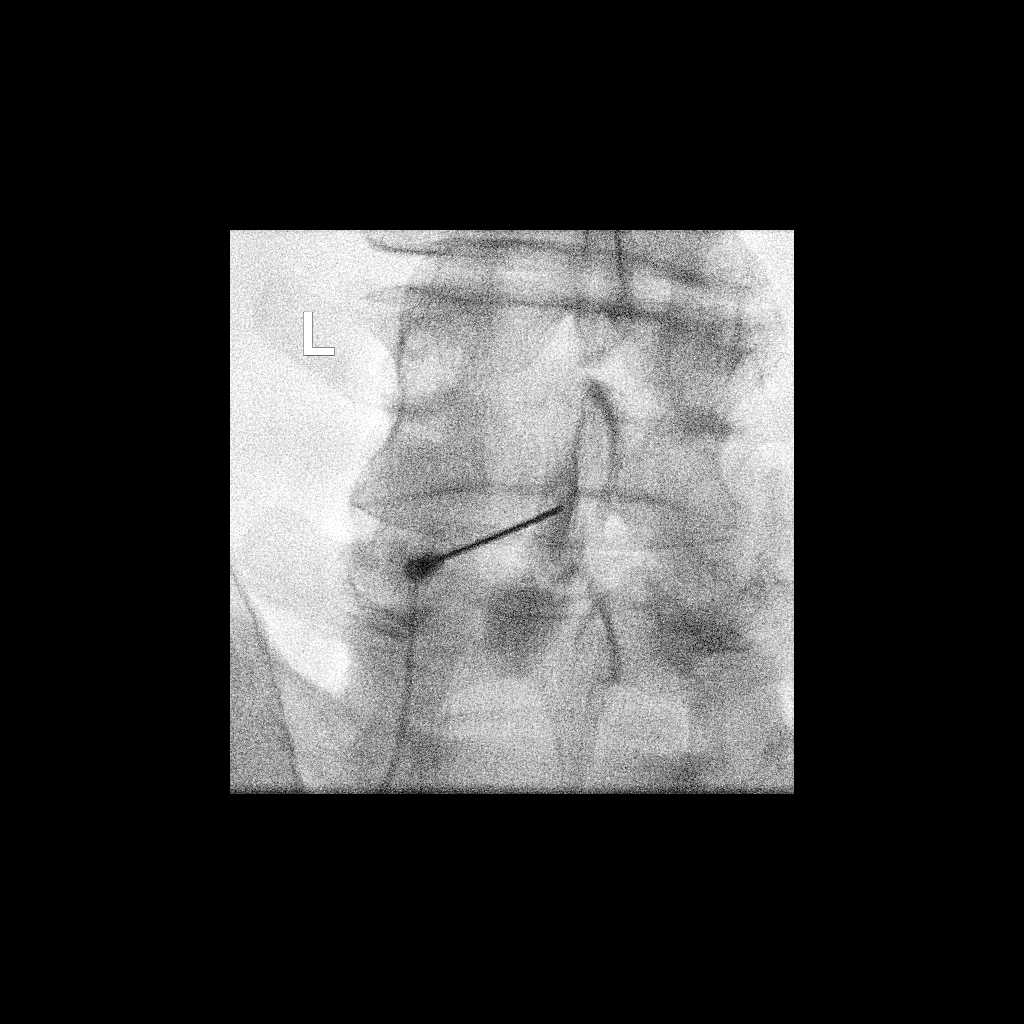

[2 of 2 positions shown; findings below may reference images not displayed]

FLUOROSCOPY:
Radiation Exposure Index (as provided by the fluoroscopic device):
1.20 mGy Kerma

PROCEDURE:
The procedure, risks, benefits, and alternatives were explained to
the patient. Questions regarding the procedure were encouraged and
answered. The patient understands and consents to the procedure.

LUMBAR EPIDURAL INJECTION:

An interlaminar approach was performed on the left at L4-5. The
overlying skin was cleansed and anesthetized. A 3.5 inch 20 gauge
epidural needle was advanced using loss-of-resistance technique.

DIAGNOSTIC EPIDURAL INJECTION:

Injection of Isovue-M 200 shows a good epidural pattern with spread
above and below the level of needle placement, primarily on the
left. No vascular opacification is seen.

THERAPEUTIC EPIDURAL INJECTION:

80 mg of Depo-Medrol mixed with 3 mL of 1% lidocaine were instilled.
The procedure was well-tolerated, and the patient was discharged
thirty minutes following the injection in good condition.

COMPLICATIONS:
None immediate
IMPRESSION: Technically successful interlaminar epidural injection on the left
at L4-5.

## 2021-10-06 MED ORDER — METHYLPREDNISOLONE ACETATE 40 MG/ML INJ SUSP (RADIOLOG
80.0000 mg | Freq: Once | INTRAMUSCULAR | Status: AC
  Administered 2021-10-06: 80 mg via EPIDURAL

## 2021-10-06 MED ORDER — IOPAMIDOL (ISOVUE-M 200) INJECTION 41%
1.0000 mL | Freq: Once | INTRAMUSCULAR | Status: AC
  Administered 2021-10-06: 1 mL via EPIDURAL

## 2021-10-06 NOTE — Discharge Instructions (Signed)

## 2021-10-20 NOTE — Progress Notes (Signed)
?  ? ?I,Sulibeya S Dimas,acting as a scribe for Lavon Paganini, MD.,have documented all relevant documentation on the behalf of Lavon Paganini, MD,as directed by  Lavon Paganini, MD while in the presence of Lavon Paganini, MD. ? ? ?Established patient visit ? ? ?Patient: Curtis [name]   DOB: [birth year]   71 y.o. Male  MRN: [id number] ?Visit Date: 10/23/2021 ? ?Today's healthcare provider: Lavon Paganini, MD  ? ?Chief Complaint  ?Patient presents with  ? Hypertension  ? ?Subjective  ?  ?HPI  ?Follow up for hypertension ? ?The patient was last seen for this 1 months ago. ?Changes made at last visit include continue amlodipine '5mg'$  daily, hctz '25mg'$  daily and increase lisinopril to '40mg'$  daily. ? ?He reports excellent compliance with treatment.  ?He feels that condition is Unchanged. ?He is not having side effects.  ?Patient reports home BP readings 150s/80s ? ?BP Readings from Last 3 Encounters:  ?10/23/21 (!) 148/66  ?10/06/21 (!) 177/66  ?09/26/21 (!) 154/70  ? ? ?----------------------------------------------------------------------------------------- ? ? ?Medications: ?Outpatient Medications Prior to Visit  ?Medication Sig  ? allopurinol (ZYLOPRIM) 300 MG tablet TAKE 1 TABLET(300 MG) BY MOUTH DAILY  ? cholecalciferol (VITAMIN D3) 25 MCG (1000 UNIT) tablet Take 2,000 Units by mouth daily.  ? Hypodermic Needles-Disposable (SAFETY-GARD NEEDLE 18G) MISC 0.5 mLs by Does not apply route every 14 (fourteen) days.  ? lisinopril (ZESTRIL) 20 MG tablet Take 1 tablet (20 mg total) by mouth daily.  ? lisinopril-hydrochlorothiazide (ZESTORETIC) 20-25 MG tablet Take 1 tablet by mouth daily.  ? Multiple Vitamin (MULTIVITAMIN) capsule Take 1 capsule by mouth daily.  ? NEEDLE, DISP, 21 G (BD SAFETYGLIDE SHIELDED NEEDLE) 21G X 1-1/2" MISC 0.5 mLs by Does not apply route every 14 (fourteen) days.  ? pravastatin (PRAVACHOL) 40 MG tablet Take 1 tablet (40 mg total) by mouth daily.  ? sildenafil (REVATIO) 20 MG tablet  TAKE 1 TABLET BY MOUTH AS NEEDED, TAKE 3-5 TABLETS AS NEEDED PROIR TO INTERCOUSE  ? Syringe, Disposable, 3 ML MISC 0.5 mLs by Does not apply route every 14 (fourteen) days.  ? Tart Cherry 1200 MG CAPS Take 1,200 mg by mouth daily in the afternoon.  ? testosterone cypionate (DEPOTESTOSTERONE CYPIONATE) 200 MG/ML injection Inject 0.5 cc every fourteen days  ? Turmeric 500 MG CAPS Take by mouth.  ? Zinc 50 MG TABS Take 50 mg by mouth daily in the afternoon.  ? [DISCONTINUED] amLODipine (NORVASC) 5 MG tablet Take 1 tablet (5 mg total) by mouth daily.  ? [DISCONTINUED] Multiple Vitamin (MULTIVITAMIN) tablet Take 1 tablet by mouth daily.  ? [DISCONTINUED] NON FORMULARY Gout Relief 1/2 oz daily  ? ?No facility-administered medications prior to visit.  ? ? ?Review of Systems  ?Constitutional:  Negative for appetite change and fatigue.  ?Respiratory:  Negative for chest tightness and shortness of breath.   ?Cardiovascular:  Negative for chest pain, palpitations and leg swelling.  ? ? ?  Objective  ?  ?BP (!) 148/66 (BP Location: Left Arm, Cuff Size: Large)   Pulse 71   Temp (!) 97.3 ?F (36.3 ?C) (Temporal)   Resp 16   Wt 201 lb 12.8 oz (91.5 kg)   SpO2 98%   BMI 29.80 kg/m?  ? ? ?Physical Exam ?Vitals reviewed.  ?Constitutional:   ?   General: He is not in acute distress. ?   Appearance: Normal appearance. He is not diaphoretic.  ?HENT:  ?   Head: Normocephalic and atraumatic.  ?Eyes:  ?   General:  No scleral icterus. ?   Conjunctiva/sclera: Conjunctivae normal.  ?Cardiovascular:  ?   Rate and Rhythm: Normal rate and regular rhythm.  ?   Pulses: Normal pulses.  ?   Heart sounds: Normal heart sounds. No murmur heard. ?Pulmonary:  ?   Effort: Pulmonary effort is normal. No respiratory distress.  ?   Breath sounds: Normal breath sounds. No wheezing or rhonchi.  ?Musculoskeletal:  ?   Cervical back: Neck supple.  ?   Right lower leg: No edema.  ?   Left lower leg: No edema.  ?Lymphadenopathy:  ?   Cervical: No cervical  adenopathy.  ?Skin: ?   General: Skin is warm and dry.  ?   Capillary Refill: Capillary refill takes less than 2 seconds.  ?Neurological:  ?   Mental Status: He is alert and oriented to person, place, and time.  ?Psychiatric:     ?   Mood and Affect: Mood normal.     ?   Behavior: Behavior normal.  ?  ? ? ?No results found for any visits on 10/23/21. ? Assessment & Plan  ?  ? ?Problem List Items Addressed This Visit   ? ?  ? Cardiovascular and Mediastinum  ? Essential hypertension - Primary  ?  Chronic and uncontrolled, but has improved significantly ?Continue lisinopril 40, hctz 25 ?Increase amlodipine to '10mg'$  daily ?Reviewed recent metabolic panel ?F/u in 1 m ?  ?  ? Relevant Medications  ? amLODipine (NORVASC) 10 MG tablet  ?  ? ?Return in about 4 weeks (around 11/20/2021) for BP f/u.  ?   ? ?I, Lavon Paganini, MD, have reviewed all documentation for this visit. The documentation on 10/23/21 for the exam, diagnosis, procedures, and orders are all accurate and complete. ? ? ?Virginia Crews, MD, MPH ?[place] ?[facility]   ?

## 2021-10-23 ENCOUNTER — Ambulatory Visit (INDEPENDENT_AMBULATORY_CARE_PROVIDER_SITE_OTHER): Payer: Medicare Other | Admitting: Family Medicine

## 2021-10-23 ENCOUNTER — Encounter: Payer: Self-pay | Admitting: Family Medicine

## 2021-10-23 ENCOUNTER — Other Ambulatory Visit: Payer: Self-pay

## 2021-10-23 VITALS — BP 148/66 | HR 71 | Temp 97.3°F | Resp 16 | Wt 201.8 lb

## 2021-10-23 DIAGNOSIS — I10 Essential (primary) hypertension: Secondary | ICD-10-CM

## 2021-10-23 MED ORDER — AMLODIPINE BESYLATE 10 MG PO TABS: 10.0000 mg | ORAL_TABLET | Freq: Every day | ORAL | 1 refills | Status: DC

## 2021-10-23 NOTE — Assessment & Plan Note (Addendum)
Chronic and uncontrolled, but has improved significantly ?Continue lisinopril 40, hctz 25 ?Increase amlodipine to '10mg'$  daily ?Reviewed recent metabolic panel ?F/u in 1 m ?

## 2021-10-23 NOTE — Patient Instructions (Signed)
Increase amlodipine to '10mg'$  daily ?Continue other meds the same ?

## 2021-11-27 ENCOUNTER — Encounter: Payer: Self-pay | Admitting: Family Medicine

## 2021-11-27 ENCOUNTER — Ambulatory Visit (INDEPENDENT_AMBULATORY_CARE_PROVIDER_SITE_OTHER): Payer: Medicare Other | Admitting: Family Medicine

## 2021-11-27 VITALS — BP 128/60 | HR 67 | Temp 97.6°F | Resp 16 | Wt 200.4 lb

## 2021-11-27 DIAGNOSIS — I10 Essential (primary) hypertension: Secondary | ICD-10-CM | POA: Diagnosis not present

## 2021-11-27 NOTE — Assessment & Plan Note (Signed)
Well controlled Continue current medications Reviewed recent metabolic panel 

## 2021-11-27 NOTE — Progress Notes (Signed)
?  ? ?I,Joseline E Rosas,acting as a scribe for Lavon Paganini, MD.,have documented all relevant documentation on the behalf of Lavon Paganini, MD,as directed by  Lavon Paganini, MD while in the presence of Lavon Paganini, MD. ? ? ?Established patient visit ? ? ?Patient: Curtis [name]   DOB: [birth year]   71 y.o. Male  MRN: [id number] ?Visit Date: 11/27/2021 ? ?Today's healthcare provider: Lavon Paganini, MD  ? ?Chief Complaint  ?Patient presents with  ? Hypertension  ? ?Subjective  ?  ?HPI  ?Hypertension, follow-up ? ?BP Readings from Last 3 Encounters:  ?11/27/21 128/60  ?10/23/21 (!) 148/66  ?10/06/21 (!) 177/66  ? Wt Readings from Last 3 Encounters:  ?11/27/21 200 lb 6.4 oz (90.9 kg)  ?10/23/21 201 lb 12.8 oz (91.5 kg)  ?09/26/21 204 lb (92.5 kg)  ?  ? ?He was last seen for hypertension 1 months ago.  ?BP at that visit was 148/99. Management since that visit includes Continue lisinopril 40, hctz 25 ?Increase amlodipine to 78m daily. ? ?He reports excellent compliance with treatment. ?He is having side effects. Urine stream is weaker ? ?Outside blood pressures are 148's/54 left arm and right arm 128/54 this morning..[name] Kitchen?Symptoms: ?No chest pain No chest pressure  ?No palpitations No syncope  ?No dyspnea No orthopnea  ?No paroxysmal nocturnal dyspnea No lower extremity edema  ? ?Pertinent labs ?Lab Results  ?Component Value Date  ? CHOL 177 01/28/2020  ? HDL 41 01/28/2020  ? LDLCALC 105 (H) 01/28/2020  ? TRIG 178 (H) 01/28/2020  ? CHOLHDL 4.3 01/28/2020  ? Lab Results  ?Component Value Date  ? NA 142 10/04/2021  ? K 4.2 10/04/2021  ? CREATININE 0.90 10/04/2021  ? EGFR 92 10/04/2021  ? GLUCOSE 115 (H) 10/04/2021  ? TSH 1.210 01/28/2020  ?  ? ?The 10-year ASCVD risk score (Arnett DK, et al., 2019) is: 21.4% ? ?---------------------------------------------------------------------------------------------------  ? ?Medications: ?Outpatient Medications Prior to Visit  ?Medication Sig  ? allopurinol  (ZYLOPRIM) 300 MG tablet TAKE 1 TABLET(300 MG) BY MOUTH DAILY  ? amLODipine (NORVASC) 10 MG tablet Take 1 tablet (10 mg total) by mouth daily.  ? cholecalciferol (VITAMIN D3) 25 MCG (1000 UNIT) tablet Take 2,000 Units by mouth daily.  ? Hypodermic Needles-Disposable (SAFETY-GARD NEEDLE 18G) MISC 0.5 mLs by Does not apply route every 14 (fourteen) days.  ? lisinopril (ZESTRIL) 20 MG tablet Take 1 tablet (20 mg total) by mouth daily.  ? lisinopril-hydrochlorothiazide (ZESTORETIC) 20-25 MG tablet Take 1 tablet by mouth daily.  ? Multiple Vitamin (MULTIVITAMIN) capsule Take 1 capsule by mouth daily.  ? NEEDLE, DISP, 21 G (BD SAFETYGLIDE SHIELDED NEEDLE) 21G X 1-1/2" MISC 0.5 mLs by Does not apply route every 14 (fourteen) days.  ? pravastatin (PRAVACHOL) 40 MG tablet Take 1 tablet (40 mg total) by mouth daily.  ? sildenafil (REVATIO) 20 MG tablet TAKE 1 TABLET BY MOUTH AS NEEDED, TAKE 3-5 TABLETS AS NEEDED PROIR TO INTERCOUSE  ? Syringe, Disposable, 3 ML MISC 0.5 mLs by Does not apply route every 14 (fourteen) days.  ? Tart Cherry 1200 MG CAPS Take 1,200 mg by mouth daily in the afternoon.  ? testosterone cypionate (DEPOTESTOSTERONE CYPIONATE) 200 MG/ML injection Inject 0.5 cc every fourteen days  ? Turmeric 500 MG CAPS Take by mouth.  ? Zinc 50 MG TABS Take 50 mg by mouth daily in the afternoon.  ? ?No facility-administered medications prior to visit.  ? ? ?Review of Systems  ?Eyes:  Negative for visual disturbance.  ?  Respiratory:  Negative for chest tightness and shortness of breath.   ?Cardiovascular:  Negative for chest pain, palpitations and leg swelling.  ?Gastrointestinal:  Negative for abdominal distention, abdominal pain, nausea and vomiting.  ? ? ?  Objective  ?  ?BP 128/60 (BP Location: Right Arm, Patient Position: Sitting, Cuff Size: Large)   Pulse 67   Temp 97.6 ?F (36.4 ?C) (Oral)   Resp 16   Wt 200 lb 6.4 oz (90.9 kg)   BMI 29.59 kg/m?  ? ? ?Physical Exam ?Vitals reviewed.  ?Constitutional:   ?    General: He is not in acute distress. ?   Appearance: Normal appearance. He is not diaphoretic.  ?HENT:  ?   Head: Normocephalic and atraumatic.  ?Eyes:  ?   General: No scleral icterus. ?   Conjunctiva/sclera: Conjunctivae normal.  ?Cardiovascular:  ?   Rate and Rhythm: Normal rate and regular rhythm.  ?   Pulses: Normal pulses.  ?   Heart sounds: Normal heart sounds. No murmur heard. ?Pulmonary:  ?   Effort: Pulmonary effort is normal. No respiratory distress.  ?   Breath sounds: Normal breath sounds. No wheezing or rhonchi.  ?Abdominal:  ?   General: There is no distension.  ?   Palpations: Abdomen is soft.  ?   Tenderness: There is no abdominal tenderness.  ?Musculoskeletal:  ?   Cervical back: Neck supple.  ?   Right lower leg: No edema.  ?   Left lower leg: No edema.  ?Lymphadenopathy:  ?   Cervical: No cervical adenopathy.  ?Skin: ?   General: Skin is warm and dry.  ?Neurological:  ?   Mental Status: He is alert and oriented to person, place, and time.  ?Psychiatric:     ?   Mood and Affect: Mood normal.     ?   Behavior: Behavior normal.  ?  ? ? ?No results found for any visits on 11/27/21. ? Assessment & Plan  ?  ? ?Problem List Items Addressed This Visit   ? ?  ? Cardiovascular and Mediastinum  ? Essential hypertension - Primary  ?  Well controlled ?Continue current medications ?Reviewed recent metabolic panel ?  ?  ?  ? ?Return in about 2 weeks (around 12/11/2021) for CPE, as scheduled.  ?   ? ?I, Lavon Paganini, MD, have reviewed all documentation for this visit. The documentation on 11/27/21 for the exam, diagnosis, procedures, and orders are all accurate and complete. ? ? ?Virginia Crews, MD, MPH ?[place] ?[facility]   ?

## 2021-12-06 NOTE — Progress Notes (Signed)
? ? ?I,Curtis [name],acting as a scribe for Lavon Paganini, MD.,have documented all relevant documentation on the behalf of Lavon Paganini, MD,as directed by  Lavon Paganini, MD while in the presence of Lavon Paganini, MD. ? ? ?Complete physical exam ? ? ?Patient: Curtis [name]   DOB: 1951-07-17   71 y.o. Male  MRN: [id number] ?Visit Date: 12/07/2021 ? ?Today's healthcare provider: Lavon Paganini, MD  ? ?Chief Complaint  ?Patient presents with  ? Annual Exam  ? ?Subjective  ?  ?Curtis [name] is a 71 y.o. male who presents today for a complete physical exam.  ?He reports consuming a general diet.  walking  He generally feels well. He reports sleeping well. He does not have additional problems to discuss today.  ?HPI  ?09/20/21 AWV ? ?Past Medical History:  ?Diagnosis Date  ? Arthritis   ? Gout   ? HTN (hypertension)   ? Hx of dysplastic nevus 12/14/2019  ? L lower flank Moderate atypia  ? Hyperlipidemia   ? Hypogonadism in male   ? Low back pain   ? Morton neuroma, left   ? ?Past Surgical History:  ?Procedure Laterality Date  ? ACHILLES TENDON SURGERY Right 1995  ? HERNIA REPAIR  2002  ? double  ? Surgery on Left pinky in 2000    ? ?Social History  ? ?Socioeconomic History  ? Marital status: Married  ?  Spouse name: Not on file  ? Number of children: 2  ? Years of education: Not on file  ? Highest education level: Bachelor's degree (e.g., BA, AB, BS)  ?Occupational History  ? Occupation: Camera operator  ?  Comment: retired  ?Tobacco Use  ? Smoking status: Never  ? Smokeless tobacco: Former  ?  Types: Snuff  ? Tobacco comments:  ?  quit snuff at age 76  ?Vaping Use  ? Vaping Use: Never used  ?Substance and Sexual Activity  ? Alcohol use: Yes  ?  Alcohol/week: 2.0 - 4.0 standard drinks  ?  Types: 2 - 4 Cans of beer per week  ? Drug use: No  ? Sexual activity: Not on file  ?Other Topics Concern  ? Not on file  ?Social History Narrative  ? Not on file  ? ?Social Determinants of Health   ? ?Financial Resource Strain: Low Risk   ? Difficulty of Paying Living Expenses: Not hard at all  ?Food Insecurity: No Food Insecurity  ? Worried About Charity fundraiser in the Last Year: Never true  ? Ran Out of Food in the Last Year: Never true  ?Transportation Needs: No Transportation Needs  ? Lack of Transportation (Medical): No  ? Lack of Transportation (Non-Medical): No  ?Physical Activity: Sufficiently Active  ? Days of Exercise per Week: 4 days  ? Minutes of Exercise per Session: 60 min  ?Stress: No Stress Concern Present  ? Feeling of Stress : Not at all  ?Social Connections: Moderately Integrated  ? Frequency of Communication with Friends and Family: More than three times a week  ? Frequency of Social Gatherings with Friends and Family: More than three times a week  ? Attends Religious Services: More than 4 times per year  ? Active Member of Clubs or Organizations: No  ? Attends Archivist Meetings: Never  ? Marital Status: Married  ?Intimate Partner Violence: Not At Risk  ? Fear of Current or Ex-Partner: No  ? Emotionally Abused: No  ? Physically Abused: No  ? Sexually Abused:  No  ? ?Family Status  ?Relation Name Status  ? Mother  Alive  ? Father  Deceased at age 74  ?     died from cancer in the liver  ? Brother  Alive  ? Daughter #1 Alive  ? Daughter #2 Alive  ? ?Family History  ?Problem Relation Age of Onset  ? Hypertension Mother   ? Arthritis Mother   ? Vision loss Mother   ?     due to age  ? Cancer Father   ?     in the liver  ? ?No Known Allergies  ?Patient Care Team: ?Virginia Crews, MD as PCP - General (Family Medicine) ?Laneta Simmers as Physician Assistant (Urology) ?Sharlotte Alamo, DPM (Podiatry) ?Sterling Big., PA-C (Orthopedic Surgery) ?Brendolyn Patty, MD (Dermatology) ?Pa, [facility])  ? ?Medications: ?Outpatient Medications Prior to Visit  ?Medication Sig  ? allopurinol (ZYLOPRIM) 300 MG tablet TAKE 1 TABLET(300 MG) BY MOUTH DAILY  ?  cholecalciferol (VITAMIN D3) 25 MCG (1000 UNIT) tablet Take 2,000 Units by mouth daily.  ? Hypodermic Needles-Disposable (SAFETY-GARD NEEDLE 18G) MISC 0.5 mLs by Does not apply route every 14 (fourteen) days.  ? Multiple Vitamin (MULTIVITAMIN) capsule Take 1 capsule by mouth daily.  ? NEEDLE, DISP, 21 G (BD SAFETYGLIDE SHIELDED NEEDLE) 21G X 1-1/2" MISC 0.5 mLs by Does not apply route every 14 (fourteen) days.  ? sildenafil (REVATIO) 20 MG tablet TAKE 1 TABLET BY MOUTH AS NEEDED, TAKE 3-5 TABLETS AS NEEDED PROIR TO INTERCOUSE  ? Syringe, Disposable, 3 ML MISC 0.5 mLs by Does not apply route every 14 (fourteen) days.  ? Tart Cherry 1200 MG CAPS Take 1,200 mg by mouth daily in the afternoon.  ? testosterone cypionate (DEPOTESTOSTERONE CYPIONATE) 200 MG/ML injection Inject 0.5 cc every fourteen days  ? Turmeric 500 MG CAPS Take by mouth.  ? Zinc 50 MG TABS Take 50 mg by mouth daily in the afternoon.  ? [DISCONTINUED] amLODipine (NORVASC) 10 MG tablet Take 1 tablet (10 mg total) by mouth daily.  ? [DISCONTINUED] lisinopril (ZESTRIL) 20 MG tablet Take 1 tablet (20 mg total) by mouth daily.  ? [DISCONTINUED] lisinopril-hydrochlorothiazide (ZESTORETIC) 20-25 MG tablet Take 1 tablet by mouth daily.  ? [DISCONTINUED] pravastatin (PRAVACHOL) 40 MG tablet Take 1 tablet (40 mg total) by mouth daily.  ? ?No facility-administered medications prior to visit.  ? ? ?Review of Systems  ?HENT:  Positive for tinnitus.   ?Eyes:  Positive for itching.  ?Musculoskeletal:  Positive for arthralgias and back pain.  ?All other systems reviewed and are negative. ? ?Last CBC ?Lab Results  ?Component Value Date  ? WBC 8.0 11/24/2020  ? HGB 14.8 09/11/2021  ? HCT 42.0 09/11/2021  ? MCV 87.6 11/24/2020  ? MCH 28.9 01/28/2020  ? RDW 14.1 11/24/2020  ? PLT 161.0 11/24/2020  ? ?Last metabolic panel ?Lab Results  ?Component Value Date  ? GLUCOSE 115 (H) 10/04/2021  ? NA 142 10/04/2021  ? K 4.2 10/04/2021  ? CL 104 10/04/2021  ? CO2 26 10/04/2021  ?  BUN 19 10/04/2021  ? CREATININE 0.90 10/04/2021  ? EGFR 92 10/04/2021  ? CALCIUM 9.2 10/04/2021  ? PROT 7.3 11/24/2020  ? ALBUMIN 4.7 11/24/2020  ? LABGLOB 2.0 01/28/2020  ? AGRATIO 2.4 (H) 01/28/2020  ? BILITOT 0.7 11/24/2020  ? ALKPHOS 38 (L) 11/24/2020  ? AST 24 11/24/2020  ? ALT 23 11/24/2020  ? ?Last lipids ?Lab Results  ?Component Value  Date  ? CHOL 177 01/28/2020  ? HDL 41 01/28/2020  ? LDLCALC 105 (H) 01/28/2020  ? TRIG 178 (H) 01/28/2020  ? CHOLHDL 4.3 01/28/2020  ? ?Last thyroid functions ?Lab Results  ?Component Value Date  ? TSH 1.210 01/28/2020  ? ?  ? Objective  ? ?  ?BP 136/65 (BP Location: Left Arm, Patient Position: Sitting, Cuff Size: Large)   Pulse 71   Temp 98.4 ?F (36.9 ?C) (Oral)   Resp 16   Ht 5' 9" (1.753 m)   Wt 203 lb 11.2 oz (92.4 kg)   BMI 30.08 kg/m?  ?BP Readings from Last 3 Encounters:  ?12/07/21 136/65  ?11/27/21 128/60  ?10/23/21 (!) 148/66  ? ?Wt Readings from Last 3 Encounters:  ?12/07/21 203 lb 11.2 oz (92.4 kg)  ?11/27/21 200 lb 6.4 oz (90.9 kg)  ?10/23/21 201 lb 12.8 oz (91.5 kg)  ? ?  ?Physical Exam ?Vitals reviewed.  ?Constitutional:   ?   General: He is not in acute distress. ?   Appearance: Normal appearance. He is well-developed. He is not diaphoretic.  ?HENT:  ?   Head: Normocephalic and atraumatic.  ?   Right Ear: Tympanic membrane, ear canal and external ear normal.  ?   Left Ear: Tympanic membrane, ear canal and external ear normal.  ?   Nose: Nose normal.  ?   Mouth/Throat:  ?   Mouth: Mucous membranes are moist.  ?   Pharynx: Oropharynx is clear. No oropharyngeal exudate.  ?Eyes:  ?   General: No scleral icterus. ?   Conjunctiva/sclera: Conjunctivae normal.  ?   Pupils: Pupils are equal, round, and reactive to light.  ?Neck:  ?   Thyroid: No thyromegaly.  ?Cardiovascular:  ?   Rate and Rhythm: Normal rate and regular rhythm.  ?   Pulses: Normal pulses.  ?   Heart sounds: Normal heart sounds. No murmur heard. ?Pulmonary:  ?   Effort: Pulmonary effort is normal.  No respiratory distress.  ?   Breath sounds: Normal breath sounds. No wheezing or rales.  ?Abdominal:  ?   General: There is no distension.  ?   Palpations: Abdomen is soft.  ?   Tenderness: There is no abdomina

## 2021-12-07 ENCOUNTER — Ambulatory Visit (INDEPENDENT_AMBULATORY_CARE_PROVIDER_SITE_OTHER): Payer: Medicare Other | Admitting: Family Medicine

## 2021-12-07 ENCOUNTER — Encounter: Payer: Self-pay | Admitting: Family Medicine

## 2021-12-07 VITALS — BP 136/65 | HR 71 | Temp 98.4°F | Resp 16 | Ht 69.0 in | Wt 203.7 lb

## 2021-12-07 DIAGNOSIS — E782 Mixed hyperlipidemia: Secondary | ICD-10-CM

## 2021-12-07 DIAGNOSIS — R739 Hyperglycemia, unspecified: Secondary | ICD-10-CM

## 2021-12-07 DIAGNOSIS — I10 Essential (primary) hypertension: Secondary | ICD-10-CM | POA: Diagnosis not present

## 2021-12-07 DIAGNOSIS — Z0000 Encounter for general adult medical examination without abnormal findings: Secondary | ICD-10-CM

## 2021-12-07 MED ORDER — AMLODIPINE BESYLATE 10 MG PO TABS: 10.0000 mg | ORAL_TABLET | Freq: Every day | ORAL | 3 refills | Status: DC

## 2021-12-07 MED ORDER — PRAVASTATIN SODIUM 40 MG PO TABS: 40.0000 mg | ORAL_TABLET | Freq: Every day | ORAL | 3 refills | Status: DC

## 2021-12-07 MED ORDER — LISINOPRIL 20 MG PO TABS: 20.0000 mg | ORAL_TABLET | Freq: Every day | ORAL | 3 refills | Status: DC

## 2021-12-07 MED ORDER — LISINOPRIL-HYDROCHLOROTHIAZIDE 20-25 MG PO TABS: 1.0000 | ORAL_TABLET | Freq: Every day | ORAL | 3 refills | Status: DC

## 2021-12-07 NOTE — Patient Instructions (Addendum)
The CDC recommends two doses of Shingrix (the shingles vaccine) separated by 2 to 6 months for adults age 71 years and older. I recommend checking with your insurance plan regarding coverage for this vaccine.  ? ? ?Consider asking at the pharmacy about the tetanus shot (TDAP) and the shingles shot (Shingrix)  ?

## 2021-12-07 NOTE — Assessment & Plan Note (Signed)
Previously well controlled Continue statin Repeat FLP and CMP  

## 2021-12-07 NOTE — Assessment & Plan Note (Signed)
Well controlled Continue current medications Recheck metabolic panel F/u in 6 months  

## 2021-12-09 LAB — LIPID PANEL WITH LDL/HDL RATIO
Cholesterol, Total: 151 mg/dL (ref 100–199)
HDL: 37 mg/dL — ABNORMAL LOW (ref >39)
LDL Chol Calc (NIH): 84 mg/dL (ref 0–99)
LDL/HDL Ratio: 2.3 ratio (ref 0.0–3.6)
Triglycerides: 176 mg/dL — ABNORMAL HIGH (ref 0–149)
VLDL Cholesterol Cal: 30 mg/dL (ref 5–40)

## 2021-12-09 LAB — COMPREHENSIVE METABOLIC PANEL
ALT: 37 IU/L (ref 0–44)
AST: 28 IU/L (ref 0–40)
Albumin/Globulin Ratio: 2.3 — ABNORMAL HIGH (ref 1.2–2.2)
Albumin: 4.6 g/dL (ref 3.8–4.8)
Alkaline Phosphatase: 46 IU/L (ref 44–121)
BUN/Creatinine Ratio: 20 (ref 10–24)
BUN: 17 mg/dL (ref 8–27)
Bilirubin Total: 0.3 mg/dL (ref 0.0–1.2)
CO2: 23 mmol/L (ref 20–29)
Calcium: 9.2 mg/dL (ref 8.6–10.2)
Chloride: 107 mmol/L — ABNORMAL HIGH (ref 96–106)
Creatinine, Ser: 0.84 mg/dL (ref 0.76–1.27)
Globulin, Total: 2 g/dL (ref 1.5–4.5)
Glucose: 103 mg/dL — ABNORMAL HIGH (ref 70–99)
Potassium: 4.2 mmol/L (ref 3.5–5.2)
Sodium: 144 mmol/L (ref 134–144)
Total Protein: 6.6 g/dL (ref 6.0–8.5)
eGFR: 94 mL/min/{1.73_m2} (ref >59)

## 2021-12-09 LAB — HEMOGLOBIN A1C
Est. average glucose Bld gHb Est-mCnc: 117 mg/dL
Hgb A1c MFr Bld: 5.7 % — ABNORMAL HIGH (ref 4.8–5.6)

## 2021-12-26 ENCOUNTER — Ambulatory Visit: Payer: Medicare Other | Admitting: Dermatology

## 2021-12-26 DIAGNOSIS — L821 Other seborrheic keratosis: Secondary | ICD-10-CM | POA: Diagnosis not present

## 2021-12-26 DIAGNOSIS — L918 Other hypertrophic disorders of the skin: Secondary | ICD-10-CM | POA: Diagnosis not present

## 2021-12-26 DIAGNOSIS — D1800 Hemangioma unspecified site: Secondary | ICD-10-CM

## 2021-12-26 DIAGNOSIS — Z1283 Encounter for screening for malignant neoplasm of skin: Secondary | ICD-10-CM | POA: Diagnosis not present

## 2021-12-26 DIAGNOSIS — Z86018 Personal history of other benign neoplasm: Secondary | ICD-10-CM

## 2021-12-26 DIAGNOSIS — D229 Melanocytic nevi, unspecified: Secondary | ICD-10-CM

## 2021-12-26 DIAGNOSIS — R238 Other skin changes: Secondary | ICD-10-CM

## 2021-12-26 DIAGNOSIS — L578 Other skin changes due to chronic exposure to nonionizing radiation: Secondary | ICD-10-CM

## 2021-12-26 DIAGNOSIS — L814 Other melanin hyperpigmentation: Secondary | ICD-10-CM

## 2021-12-26 NOTE — Progress Notes (Signed)
Follow-Up Visit   Subjective  Curtis [name] is a 71 y.o. male who presents for the following: Upper body skin exam (Hx of Dysplastic nevus L lower flank).  The patient presents for Upper Body Skin Exam (UBSE) for skin cancer screening and mole check.  The patient has spots, moles and lesions to be evaluated, some may be new or changing and the patient has concerns that these could be cancer. Skin tag under arm is irritating.  The following portions of the chart were reviewed this encounter and updated as appropriate:       Review of Systems:  No other skin or systemic complaints except as noted in HPI or Assessment and Plan.  Objective  Well appearing patient in no apparent distress; mood and affect are within normal limits.  All skin waist up examined.  R forehead 4.48m pink pap    Assessment & Plan   Lentigines - Scattered tan macules - Due to sun exposure - Benign-appearing, observe - Recommend daily broad spectrum sunscreen SPF 30+ to sun-exposed areas, reapply every 2 hours as needed. - Call for any changes - upper back  Seborrheic Keratoses - Stuck-on, waxy, tan-brown papules and/or plaques  - Benign-appearing - Discussed benign etiology and prognosis. - Observe - Call for any changes - back, arms  Melanocytic Nevi - Tan-brown and/or pink-flesh-colored symmetric macules and papules - Benign appearing on exam today - Observation - Call clinic for new or changing moles - Recommend daily use of broad spectrum spf 30+ sunscreen to sun-exposed areas.  - trunk  Hemangiomas - Red papules - Discussed benign nature - Observe - Call for any changes - trunk  Actinic Damage - Chronic condition, secondary to cumulative UV/sun exposure - diffuse scaly erythematous macules with underlying dyspigmentation - Recommend daily broad spectrum sunscreen SPF 30+ to sun-exposed areas, reapply every 2 hours as needed.  - Staying in the shade or wearing long sleeves, sun  glasses (UVA+UVB protection) and wide brim hats (4-inch brim around the entire circumference of the hat) are also recommended for sun protection.  - Call for new or changing lesions. - chest  Skin cancer screening performed today.  Skin tag L post axillary  Acrochordons (Skin Tags) - Removal desired by patient - Fleshy, skin-colored pedunculated papules - Benign appearing.  - Patient desires removal since irritated. - Prior to the procedure, reviewed the expected small wound. Also reviewed the risk of leaving a small scar and the small risk of infection.  PROCEDURE - The areas were prepped with isopropyl alcohol. A small amount of lidocaine 1% with epinephrine was injected at the base of lesion to achieve good local anesthesia. The skin tag was removed using a snip technique. Aluminum chloride was used for hemostasis. Petrolatum and a bandage were applied. The procedure was tolerated well. - Wound care was reviewed with the patient. They were advised to call with any concerns. Total number of treated acrochordons 1   Inflammatory papule R forehead  Vs Bite, benign appearing, observe   History of Dysplastic Nevus - No evidence of recurrence today - Recommend regular full body skin exams - Recommend daily broad spectrum sunscreen SPF 30+ to sun-exposed areas, reapply every 2 hours as needed.  - Call if any new or changing lesions are noted between office visits   Return in about 1 year (around 12/27/2022) for TBSE, Hx of Dysplastic nevi.  I, SOthelia Pulling RMA, am acting as scribe for TBrendolyn Patty MD .  Documentation: I have  reviewed the above documentation for accuracy and completeness, and I agree with the above.  Brendolyn Patty MD

## 2021-12-26 NOTE — Patient Instructions (Addendum)

## 2022-03-13 ENCOUNTER — Other Ambulatory Visit: Payer: Medicare Other

## 2022-03-13 DIAGNOSIS — N138 Other obstructive and reflux uropathy: Secondary | ICD-10-CM

## 2022-03-13 DIAGNOSIS — E349 Endocrine disorder, unspecified: Secondary | ICD-10-CM

## 2022-03-14 LAB — HEMOGLOBIN AND HEMATOCRIT, BLOOD
Hematocrit: 43.4 % (ref 37.5–51.0)
Hemoglobin: 14.8 g/dL (ref 13.0–17.7)

## 2022-03-14 LAB — PSA: Prostate Specific Ag, Serum: 2.4 ng/mL (ref 0.0–4.0)

## 2022-03-14 LAB — TESTOSTERONE: Testosterone: 404 ng/dL (ref 264–916)

## 2022-03-30 ENCOUNTER — Encounter: Payer: Medicare Other | Admitting: Family Medicine

## 2022-04-13 ENCOUNTER — Other Ambulatory Visit: Payer: Self-pay | Admitting: Family Medicine

## 2022-04-13 DIAGNOSIS — M1A9XX1 Chronic gout, unspecified, with tophus (tophi): Secondary | ICD-10-CM

## 2022-04-19 ENCOUNTER — Encounter: Payer: Self-pay | Admitting: Family Medicine

## 2022-04-19 MED ORDER — COLCHICINE 0.6 MG PO TABS
0.6000 mg | ORAL_TABLET | Freq: Every day | ORAL | 0 refills | Status: DC
Start: 2022-04-19 — End: 2022-04-20

## 2022-04-20 ENCOUNTER — Telehealth: Payer: Self-pay

## 2022-04-20 MED ORDER — COLCHICINE 0.6 MG PO TABS: 0.6000 mg | ORAL_TABLET | Freq: Every day | ORAL | 0 refills | Status: DC

## 2022-04-20 NOTE — Telephone Encounter (Signed)
Patient request refill sent to [facility] pharmacy.

## 2022-04-26 ENCOUNTER — Other Ambulatory Visit: Payer: Self-pay | Admitting: Family Medicine

## 2022-06-08 NOTE — Progress Notes (Unsigned)
I,Jenisse Vullo S Mikelle Myrick,acting as a scribe for Lavon Paganini, MD.,have documented all relevant documentation on the behalf of Lavon Paganini, MD,as directed by  Lavon Paganini, MD while in the presence of Lavon Paganini, MD.     Established patient visit   Patient: Curtis [name]   DOB: 1951-01-27   71 y.o. Male  MRN: [id number] Visit Date: 06/11/2022  Today's healthcare provider: Lavon Paganini, MD   No chief complaint on file.  Subjective    HPI  Hypertension, follow-up  BP Readings from Last 3 Encounters:  12/07/21 136/65  11/27/21 128/60  10/23/21 (!) 148/66   Wt Readings from Last 3 Encounters:  12/07/21 203 lb 11.2 oz (92.4 kg)  11/27/21 200 lb 6.4 oz (90.9 kg)  10/23/21 201 lb 12.8 oz (91.5 kg)     He was last seen for hypertension 6 months ago.  BP at that visit was 136/65. Management since that visit includes no changes. Continue amlodipine 10 mg, lisinopril-hctz 20-25 mg.  He reports {excellent/good/fair/poor:19665} compliance with treatment. He {is/is not:9024} having side effects. {document side effects if present:1} He is following a {diet:21022986} diet. He {is/is not:9024} exercising. He {does/does not:200015} smoke.  Use of agents associated with hypertension: {bp agents assoc with hypertension:511::"none"}.   Outside blood pressures are {***enter patient reported home BP readings, or 'not being checked':1}. Symptoms: {Yes/No:20286} chest pain {Yes/No:20286} chest pressure  {Yes/No:20286} palpitations {Yes/No:20286} syncope  {Yes/No:20286} dyspnea {Yes/No:20286} orthopnea  {Yes/No:20286} paroxysmal nocturnal dyspnea {Yes/No:20286} lower extremity edema   Pertinent labs Lab Results  Component Value Date   CHOL 151 12/08/2021   HDL 37 (L) 12/08/2021   LDLCALC 84 12/08/2021   TRIG 176 (H) 12/08/2021   CHOLHDL 4.3 01/28/2020   Lab Results  Component Value Date   NA 144 12/08/2021   K 4.2 12/08/2021   CREATININE 0.84 12/08/2021    EGFR 94 12/08/2021   GLUCOSE 103 (H) 12/08/2021   TSH 1.210 01/28/2020     The 10-year ASCVD risk score (Arnett DK, et al., 2019) is: 24.4%  --------------------------------------------------------------------------------------------------- Lipid/Cholesterol, Follow-up  Last lipid panel Other pertinent labs  Lab Results  Component Value Date   CHOL 151 12/08/2021   HDL 37 (L) 12/08/2021   LDLCALC 84 12/08/2021   TRIG 176 (H) 12/08/2021   CHOLHDL 4.3 01/28/2020   Lab Results  Component Value Date   ALT 37 12/08/2021   AST 28 12/08/2021   PLT 161.0 11/24/2020   TSH 1.210 01/28/2020     He was last seen for this 6 months ago.  Management since that visit includes no changes. Continue pravastatin 40 mg.   He reports {excellent/good/fair/poor:19665} compliance with treatment. He {is/is not:9024} having side effects. {document side effects if present:1}  Symptoms: {Yes/No:20286} chest pain {Yes/No:20286} chest pressure/discomfort  {Yes/No:20286} dyspnea {Yes/No:20286} lower extremity edema  {Yes/No:20286} numbness or tingling of extremity {Yes/No:20286} orthopnea  {Yes/No:20286} palpitations {Yes/No:20286} paroxysmal nocturnal dyspnea  {Yes/No:20286} speech difficulty {Yes/No:20286} syncope     The 10-year ASCVD risk score (Arnett DK, et al., 2019) is: 24.4%  --------------------------------------------------------------------------------------------------- Prediabetes, Follow-up  Lab Results  Component Value Date   HGBA1C 5.7 (H) 12/08/2021   GLUCOSE 103 (H) 12/08/2021   GLUCOSE 115 (H) 10/04/2021   GLUCOSE 94 11/24/2020    Last seen for for this6 months ago.  Management since that visit includes no changes. Current symptoms include {Symptoms; diabetes:14075} and have been {Desc; course:15616}.   Pertinent Labs:    Component Value Date/Time   CHOL 151 12/08/2021 0828   TRIG  176 (H) 12/08/2021 0828   CHOLHDL 4.3 01/28/2020 1037   CREATININE 0.84  12/08/2021 0828    Wt Readings from Last 3 Encounters:  12/07/21 203 lb 11.2 oz (92.4 kg)  11/27/21 200 lb 6.4 oz (90.9 kg)  10/23/21 201 lb 12.8 oz (91.5 kg)  -----------------------------------------------------------------------------------------   Medications: Outpatient Medications Prior to Visit  Medication Sig   allopurinol (ZYLOPRIM) 300 MG tablet TAKE 1 TABLET BY MOUTH DAILY   amLODipine (NORVASC) 10 MG tablet Take 1 tablet (10 mg total) by mouth daily.   cholecalciferol (VITAMIN D3) 25 MCG (1000 UNIT) tablet Take 2,000 Units by mouth daily.   colchicine 0.6 MG tablet Take 1 tablet (0.6 mg total) by mouth daily.   Hypodermic Needles-Disposable (SAFETY-GARD NEEDLE 18G) MISC 0.5 mLs by Does not apply route every 14 (fourteen) days.   lisinopril (ZESTRIL) 20 MG tablet Take 1 tablet (20 mg total) by mouth daily.   lisinopril-hydrochlorothiazide (ZESTORETIC) 20-25 MG tablet Take 1 tablet by mouth daily.   Multiple Vitamin (MULTIVITAMIN) capsule Take 1 capsule by mouth daily.   NEEDLE, DISP, 21 G (BD SAFETYGLIDE SHIELDED NEEDLE) 21G X 1-1/2" MISC 0.5 mLs by Does not apply route every 14 (fourteen) days.   pravastatin (PRAVACHOL) 40 MG tablet Take 1 tablet (40 mg total) by mouth daily.   sildenafil (REVATIO) 20 MG tablet TAKE 1 TABLET BY MOUTH AS NEEDED, TAKE 3-5 TABLETS AS NEEDED PROIR TO INTERCOUSE   Syringe, Disposable, 3 ML MISC 0.5 mLs by Does not apply route every 14 (fourteen) days.   Tart Cherry 1200 MG CAPS Take 1,200 mg by mouth daily in the afternoon.   testosterone cypionate (DEPOTESTOSTERONE CYPIONATE) 200 MG/ML injection Inject 0.5 cc every fourteen days   Turmeric 500 MG CAPS Take by mouth.   Zinc 50 MG TABS Take 50 mg by mouth daily in the afternoon.   No facility-administered medications prior to visit.    Review of Systems  Constitutional:  Negative for appetite change and fatigue.  Eyes:  Negative for visual disturbance.  Respiratory:  Negative for chest  tightness and shortness of breath.   Cardiovascular:  Negative for chest pain, palpitations and leg swelling.  Gastrointestinal:  Negative for abdominal distention, abdominal pain, nausea and vomiting.  Neurological:  Negative for headaches.    {Labs  Heme  Chem  Endocrine  Serology  Results Review (optional):23779}   Objective    There were no vitals taken for this visit. BP Readings from Last 3 Encounters:  12/07/21 136/65  11/27/21 128/60  10/23/21 (!) 148/66   Wt Readings from Last 3 Encounters:  12/07/21 203 lb 11.2 oz (92.4 kg)  11/27/21 200 lb 6.4 oz (90.9 kg)  10/23/21 201 lb 12.8 oz (91.5 kg)      Physical Exam  ***  No results found for any visits on 06/11/22.  Assessment & Plan     ***  No follow-ups on file.      {provider attestation***:1}   Lavon Paganini, MD  [facility] [phone] (phone) [phone] (fax)  [place]

## 2022-06-11 ENCOUNTER — Encounter: Payer: Self-pay | Admitting: Family Medicine

## 2022-06-11 ENCOUNTER — Ambulatory Visit (INDEPENDENT_AMBULATORY_CARE_PROVIDER_SITE_OTHER): Payer: Medicare Other | Admitting: Family Medicine

## 2022-06-11 VITALS — BP 130/70 | HR 68 | Temp 97.8°F | Resp 16 | Ht 69.0 in | Wt 200.7 lb

## 2022-06-11 DIAGNOSIS — M1A09X1 Idiopathic chronic gout, multiple sites, with tophus (tophi): Secondary | ICD-10-CM

## 2022-06-11 DIAGNOSIS — I10 Essential (primary) hypertension: Secondary | ICD-10-CM | POA: Diagnosis not present

## 2022-06-11 DIAGNOSIS — R7303 Prediabetes: Secondary | ICD-10-CM

## 2022-06-11 DIAGNOSIS — E782 Mixed hyperlipidemia: Secondary | ICD-10-CM

## 2022-06-11 DIAGNOSIS — R739 Hyperglycemia, unspecified: Secondary | ICD-10-CM

## 2022-06-11 MED ORDER — COLCHICINE 0.6 MG PO TABS: 0.6000 mg | ORAL_TABLET | Freq: Two times a day (BID) | ORAL | 0 refills | Status: AC | PRN

## 2022-06-11 NOTE — Assessment & Plan Note (Signed)
Previously well controlled Continue statin Repeat FLP and CMP annually

## 2022-06-11 NOTE — Assessment & Plan Note (Signed)
Well controlled on home readings Continue current medications Recheck metabolic panel F/u in 6 months  

## 2022-06-11 NOTE — Assessment & Plan Note (Addendum)
Chronic and uncontrolled Has had recent flares Continue allopurinol daily and colchicine prn Recheck uric acid level and if not <6, will titrate allopurinol dose for better control

## 2022-06-11 NOTE — Assessment & Plan Note (Signed)
Recommend low carb diet °Recheck A1c  °

## 2022-06-12 LAB — BASIC METABOLIC PANEL
BUN/Creatinine Ratio: 17 (ref 10–24)
BUN: 17 mg/dL (ref 8–27)
CO2: 24 mmol/L (ref 20–29)
Calcium: 9.6 mg/dL (ref 8.6–10.2)
Chloride: 105 mmol/L (ref 96–106)
Creatinine, Ser: 1.02 mg/dL (ref 0.76–1.27)
Glucose: 117 mg/dL — ABNORMAL HIGH (ref 70–99)
Potassium: 4.6 mmol/L (ref 3.5–5.2)
Sodium: 143 mmol/L (ref 134–144)
eGFR: 79 mL/min/{1.73_m2} (ref >59)

## 2022-06-12 LAB — URIC ACID: Uric Acid: 4.5 mg/dL (ref 3.8–8.4)

## 2022-06-12 LAB — HEMOGLOBIN A1C
Est. average glucose Bld gHb Est-mCnc: 114 mg/dL
Hgb A1c MFr Bld: 5.6 % (ref 4.8–5.6)

## 2022-07-11 ENCOUNTER — Other Ambulatory Visit: Payer: Self-pay | Admitting: Family Medicine

## 2022-07-11 DIAGNOSIS — M1A9XX1 Chronic gout, unspecified, with tophus (tophi): Secondary | ICD-10-CM

## 2022-08-29 ENCOUNTER — Encounter: Payer: Self-pay | Admitting: Ophthalmology

## 2022-08-30 ENCOUNTER — Other Ambulatory Visit: Payer: Self-pay | Admitting: Family Medicine

## 2022-09-03 NOTE — Discharge Instructions (Signed)

## 2022-09-05 ENCOUNTER — Ambulatory Visit: Payer: Medicare Other | Admitting: Anesthesiology

## 2022-09-05 ENCOUNTER — Encounter: Payer: Self-pay | Admitting: Ophthalmology

## 2022-09-05 ENCOUNTER — Ambulatory Visit
Admission: RE | Admit: 2022-09-05 | Discharge: 2022-09-05 | Disposition: A | Discharge status: Home / Self Care | Payer: Medicare Other | Attending: Ophthalmology | Admitting: Ophthalmology

## 2022-09-05 ENCOUNTER — Encounter
Admission: RE | Disposition: A | Discharge status: Home / Self Care | Payer: Self-pay | Source: Home / Self Care | Attending: Ophthalmology

## 2022-09-05 ENCOUNTER — Other Ambulatory Visit: Payer: Self-pay

## 2022-09-05 DIAGNOSIS — R7303 Prediabetes: Secondary | ICD-10-CM | POA: Insufficient documentation

## 2022-09-05 DIAGNOSIS — M109 Gout, unspecified: Secondary | ICD-10-CM | POA: Insufficient documentation

## 2022-09-05 DIAGNOSIS — M1990 Unspecified osteoarthritis, unspecified site: Secondary | ICD-10-CM | POA: Insufficient documentation

## 2022-09-05 DIAGNOSIS — E785 Hyperlipidemia, unspecified: Secondary | ICD-10-CM | POA: Diagnosis not present

## 2022-09-05 DIAGNOSIS — H2512 Age-related nuclear cataract, left eye: Secondary | ICD-10-CM | POA: Insufficient documentation

## 2022-09-05 DIAGNOSIS — I10 Essential (primary) hypertension: Secondary | ICD-10-CM | POA: Diagnosis not present

## 2022-09-05 HISTORY — DX: Dizziness and giddiness: R42

## 2022-09-05 HISTORY — PX: CATARACT EXTRACTION W/PHACO: SHX586

## 2022-09-05 HISTORY — DX: Prediabetes: R73.03

## 2022-09-05 SURGERY — PHACOEMULSIFICATION, CATARACT, WITH IOL INSERTION
Anesthesia: Monitor Anesthesia Care | Site: Eye | Laterality: Left

## 2022-09-05 MED ORDER — CEFUROXIME OPHTHALMIC INJECTION 1 MG/0.1 ML
INJECTION | OPHTHALMIC | Status: DC | PRN
  Administered 2022-09-05: .1 mL via INTRACAMERAL

## 2022-09-05 MED ORDER — SODIUM CHLORIDE 0.9% FLUSH
INTRAVENOUS | Status: DC | PRN
  Administered 2022-09-05: 10 mL via INTRAVENOUS

## 2022-09-05 MED ORDER — SIGHTPATH DOSE#1 NA HYALUR & NA CHOND-NA HYALUR IO KIT
PACK | INTRAOCULAR | Status: DC | PRN
  Administered 2022-09-05: 1 via OPHTHALMIC

## 2022-09-05 MED ORDER — SIGHTPATH DOSE#1 BSS IO SOLN
INTRAOCULAR | Status: DC | PRN
  Administered 2022-09-05: 2 mL

## 2022-09-05 MED ORDER — BRIMONIDINE TARTRATE-TIMOLOL 0.2-0.5 % OP SOLN
OPHTHALMIC | Status: DC | PRN
  Administered 2022-09-05: 1 [drp] via OPHTHALMIC

## 2022-09-05 MED ORDER — SIGHTPATH DOSE#1 BSS IO SOLN
INTRAOCULAR | Status: DC | PRN
  Administered 2022-09-05: 73 mL via OPHTHALMIC

## 2022-09-05 MED ORDER — ARMC OPHTHALMIC DILATING DROPS
1.0000 | OPHTHALMIC | Status: DC | PRN
  Administered 2022-09-05 (×3): 1 via OPHTHALMIC

## 2022-09-05 MED ORDER — FENTANYL CITRATE (PF) 100 MCG/2ML IJ SOLN
INTRAMUSCULAR | Status: DC | PRN
  Administered 2022-09-05: 50 ug via INTRAVENOUS

## 2022-09-05 MED ORDER — SIGHTPATH DOSE#1 BSS IO SOLN
INTRAOCULAR | Status: DC | PRN
  Administered 2022-09-05: 15 mL via INTRAOCULAR

## 2022-09-05 MED ORDER — TETRACAINE HCL 0.5 % OP SOLN
1.0000 [drp] | OPHTHALMIC | Status: DC | PRN
  Administered 2022-09-05 (×3): 1 [drp] via OPHTHALMIC

## 2022-09-05 MED ORDER — MIDAZOLAM HCL 2 MG/2ML IJ SOLN
INTRAMUSCULAR | Status: DC | PRN
  Administered 2022-09-05: 1 mg via INTRAVENOUS

## 2022-09-05 SURGICAL SUPPLY — 12 items
CANNULA ANT/CHMB 27G (MISCELLANEOUS) IMPLANT
CANNULA ANT/CHMB 27GA (MISCELLANEOUS) IMPLANT
CATARACT SUITE SIGHTPATH (MISCELLANEOUS) ×1 IMPLANT
FEE CATARACT SUITE SIGHTPATH (MISCELLANEOUS) ×1 IMPLANT
GLOVE SRG 8 PF TXTR STRL LF DI (GLOVE) ×1 IMPLANT
GLOVE SURG ENC TEXT LTX SZ7.5 (GLOVE) ×1 IMPLANT
GLOVE SURG UNDER POLY LF SZ8 (GLOVE) ×1
LENS IOL TECNIS EYHANCE 22.0 (Intraocular Lens) IMPLANT
NDL FILTER BLUNT 18X1 1/2 (NEEDLE) ×1 IMPLANT
NEEDLE FILTER BLUNT 18X1 1/2 (NEEDLE) ×1 IMPLANT
SYR 3ML LL SCALE MARK (SYRINGE) ×1 IMPLANT
WATER STERILE IRR 250ML POUR (IV SOLUTION) ×1 IMPLANT

## 2022-09-05 NOTE — Anesthesia Preprocedure Evaluation (Signed)
Anesthesia Evaluation  Patient identified by MRN, date of birth, ID band Patient awake    Reviewed: Allergy & Precautions, NPO status , Patient's Chart, lab work & pertinent test results  History of Anesthesia Complications Negative for: history of anesthetic complications  Airway Mallampati: III  TM Distance: >3 FB Neck ROM: full    Dental no notable dental hx.    Pulmonary neg pulmonary ROS   Pulmonary exam normal        Cardiovascular hypertension, On Medications negative cardio ROS Normal cardiovascular exam     Neuro/Psych negative neurological ROS  negative psych ROS   GI/Hepatic negative GI ROS, Neg liver ROS,,,  Endo/Other  negative endocrine ROS    Renal/GU      Musculoskeletal   Abdominal   Peds  Hematology negative hematology ROS (+)   Anesthesia Other Findings Past Medical History: No date: Arthritis No date: Gout No date: HTN (hypertension) 12/14/2019: Hx of dysplastic nevus     Comment:  L lower flank Moderate atypia No date: Hyperlipidemia No 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 No date: Low back pain No date: Morton neuroma, left No date: Pre-diabetes No date: Vertigo     Comment:  none for several years  Past Surgical History: 1995: ACHILLES TENDON SURGERY; Right 2002: HERNIA REPAIR     Comment:  double No date: Surgery on Left pinky in 2000  BMI    Body Mass Index: 28.35 kg/m      Reproductive/Obstetrics negative OB ROS                             Anesthesia Physical Anesthesia Plan  ASA: 2  Anesthesia Plan: MAC   Post-op Pain Management: Minimal or no pain anticipated   Induction: Intravenous  PONV Risk Score and Plan:   Airway Management Planned: Natural Airway and Nasal Cannula  Additional Equipment:   Intra-op Plan:   Post-operative Plan:   Informed Consent: I have reviewed the patients History and Physical, chart, labs and discussed the  procedure including the risks, benefits and alternatives for the proposed anesthesia with the patient or authorized representative who has indicated his/her understanding and acceptance.     Dental Advisory Given  Plan Discussed with: Anesthesiologist, CRNA and Surgeon  Anesthesia Plan Comments: (Patient consented for risks of anesthesia including but not limited to:  - adverse reactions to medications - damage to eyes, teeth, lips or other oral mucosa - nerve damage due to positioning  - sore throat or hoarseness - Damage to heart, brain, nerves, lungs, other parts of body or loss of life  Patient voiced understanding.)       Anesthesia Quick Evaluation

## 2022-09-05 NOTE — Op Note (Signed)
OPERATIVE NOTE  Curtis [name] [id number] 09/05/2022   PREOPERATIVE DIAGNOSIS:  Nuclear sclerotic cataract left eye. H25.12   POSTOPERATIVE DIAGNOSIS:    Nuclear sclerotic cataract left eye.     PROCEDURE:  Phacoemusification with posterior chamber intraocular lens placement of the left eye  Ultrasound time: Procedure(s): CATARACT EXTRACTION PHACO AND INTRAOCULAR LENS PLACEMENT (IOC) LEFT 4.99 00:44.5 (Left)  LENS:   Implant Name Type Inv. Item Serial No. Manufacturer Lot No. LRB No. Used Action  LENS IOL TECNIS EYHANCE 22.0 - [id number] Intraocular Lens LENS IOL TECNIS EYHANCE 22.0 [id number] SIGHT[employer]  Left 1 Implanted      SURGEON:  Wyonia Hough, MD   ANESTHESIA:  Topical with tetracaine drops and 2% Xylocaine jelly, augmented with 1% preservative-free intracameral lidocaine.    COMPLICATIONS:  None.   DESCRIPTION OF PROCEDURE:  The patient was identified in the holding room and transported to the operating room and placed in the supine position under the operating microscope.  The left eye was identified as the operative eye and it was prepped and draped in the usual sterile ophthalmic fashion.   A 1 millimeter clear-corneal paracentesis was made at the 1:30 position.  0.5 ml of preservative-free 1% lidocaine was injected into the anterior chamber.  The anterior chamber was filled with Viscoat viscoelastic.  A 2.4 millimeter keratome was used to make a near-clear corneal incision at the 10:30 position.  .  A curvilinear capsulorrhexis was made with a cystotome and capsulorrhexis forceps.  Balanced salt solution was used to hydrodissect and hydrodelineate the nucleus.   Phacoemulsification was then used in stop and chop fashion to remove the lens nucleus and epinucleus.  The remaining cortex was then removed using the irrigation and aspiration handpiece. Provisc was then placed into the capsular bag to distend it for lens placement.  A lens was then injected into the  capsular bag.  The remaining viscoelastic was aspirated.   Wounds were hydrated with balanced salt solution.  The anterior chamber was inflated to a physiologic pressure with balanced salt solution.  No wound leaks were noted. Cefuroxime 0.1 ml of a '10mg'$ /ml solution was injected into the anterior chamber for a dose of 1 mg of intracameral antibiotic at the completion of the case.   Timolol and Brimonidine drops were applied to the eye.  The patient was taken to the recovery room in stable condition without complications of anesthesia or surgery.  Curtis [name] 09/05/2022, 1:28 PM

## 2022-09-05 NOTE — H&P (Signed)
[facility]   Primary Care Physician:  Virginia Crews, MD Ophthalmologist: Dr. Leandrew Koyanagi  Pre-Procedure History & Physical: HPI:  Curtis [name] is a 72 y.o. male here for ophthalmic surgery.   Past Medical History:  Diagnosis Date   Arthritis    Gout    HTN (hypertension)    Hx of dysplastic nevus 12/14/2019   L lower flank Moderate atypia   Hyperlipidemia    Hypogonadism in male    Low back pain    Morton neuroma, left    Pre-diabetes    Vertigo    none for several years    Past Surgical History:  Procedure Laterality Date   [place]  2002   double   Surgery on Left pinky in 2000      Prior to Admission medications   Medication Sig Start Date End Date Taking? Authorizing Provider  allopurinol (ZYLOPRIM) 300 MG tablet TAKE 1 TABLET BY MOUTH DAILY 07/12/22  Yes Hulan Saas M, DO  amLODipine (NORVASC) 10 MG tablet Take 1 tablet (10 mg total) by mouth daily. 12/07/21  Yes Bacigalupo, Dionne Bucy, MD  cholecalciferol (VITAMIN D3) 25 MCG (1000 UNIT) tablet Take 2,000 Units by mouth daily.   Yes [provider]  colchicine 0.6 MG tablet Take 1 tablet (0.6 mg total) by mouth 2 (two) times daily as needed (gout flares). 06/11/22  Yes Bacigalupo, Dionne Bucy, MD  lisinopril (ZESTRIL) 20 MG tablet TAKE 1 TABLET BY MOUTH DAILY. 08/30/22  Yes Bacigalupo, Dionne Bucy, MD  lisinopril-hydrochlorothiazide (ZESTORETIC) 20-25 MG tablet TAKE 1 TABLET BY MOUTH DAILY. 08/30/22  Yes Bacigalupo, Dionne Bucy, MD  Multiple Vitamin (MULTIVITAMIN) capsule Take 1 capsule by mouth daily.   Yes [provider]  pravastatin (PRAVACHOL) 40 MG tablet Take 1 tablet (40 mg total) by mouth daily. 12/07/21  Yes Bacigalupo, Dionne Bucy, MD  sildenafil (REVATIO) 20 MG tablet TAKE 1 TABLET BY MOUTH AS NEEDED, TAKE 3-5 TABLETS AS NEEDED PROIR TO INTERCOUSE 04/04/21  Yes McGowan, Shannon A, PA-C  Tart Cherry 1200 MG CAPS Take 1,200 mg by mouth  daily in the afternoon.   Yes [provider]  testosterone cypionate (DEPOTESTOSTERONE CYPIONATE) 200 MG/ML injection Inject 0.5 cc every fourteen days 09/13/21  Yes McGowan, Larene Beach A, PA-C  Turmeric 500 MG CAPS Take by mouth.   Yes [provider]  Zinc 50 MG TABS Take 50 mg by mouth daily in the afternoon.   Yes [provider]  Hypodermic Needles-Disposable (SAFETY-GARD NEEDLE 18G) MISC 0.5 mLs by Does not apply route every 14 (fourteen) days. 09/13/21   McGowan, Larene Beach A, PA-C  NEEDLE, DISP, 21 G (BD SAFETYGLIDE SHIELDED NEEDLE) 21G X 1-1/2" MISC 0.5 mLs by Does not apply route every 14 (fourteen) days. 09/13/21   Zara Council A, PA-C  Syringe, Disposable, 3 ML MISC 0.5 mLs by Does not apply route every 14 (fourteen) days. 09/13/21   Zara Council A, PA-C    Allergies as of 05/15/2022   (No Known Allergies)    Family History  Problem Relation Age of Onset   Hypertension Mother    Arthritis Mother    Vision loss Mother        due to age   Cancer Father        in the liver    Social History   Socioeconomic History   Marital status: Married    Spouse name: Not on file   Number  of children: 2   Years of education: Not on file   Highest education level: Bachelor's degree (e.g., BA, AB, BS)  Occupational History   Occupation: Camera operator    Comment: retired  Tobacco Use   Smoking status: Never   Smokeless tobacco: Former    Types: Snuff, Sarina Ser    Quit date: 2009   Tobacco comments:    quit snuff at age 69  Vaping Use   Vaping Use: Never used  Substance and Sexual Activity   Alcohol use: Yes    Alcohol/week: 2.0 - 4.0 standard drinks of alcohol    Types: 2 - 4 Cans of beer per week   Drug use: No   Sexual activity: Not on file  Other Topics Concern   Not on file  Social History Narrative   Not on file   Social Determinants of Health   Financial Resource Strain: Low Risk  (09/20/2021)   Overall Financial Resource Strain  (CARDIA)    Difficulty of Paying Living Expenses: Not hard at all  Food Insecurity: No Food Insecurity (09/20/2021)   Hunger Vital Sign    Worried About Running Out of Food in the Last Year: Never true    Ran Out of Food in the Last Year: Never true  Transportation Needs: No Transportation Needs (09/20/2021)   PRAPARE - Hydrologist (Medical): No    Lack of Transportation (Non-Medical): No  Physical Activity: Sufficiently Active (09/20/2021)   Exercise Vital Sign    Days of Exercise per Week: 4 days    Minutes of Exercise per Session: 60 min  Stress: No Stress Concern Present (09/20/2021)   [place]    Feeling of Stress : Not at all  Social Connections: Moderately Integrated (09/20/2021)   Social Connection and Isolation Panel [NHANES]    Frequency of Communication with Friends and Family: More than three times a week    Frequency of Social Gatherings with Friends and Family: More than three times a week    Attends Religious Services: More than 4 times per year    Active Member of [employer] or Organizations: No    Attends Archivist Meetings: Never    Marital Status: Married  Human resources officer Violence: Not At Risk (09/20/2021)   Humiliation, Afraid, Rape, and Kick questionnaire    Fear of Current or Ex-Partner: No    Emotionally Abused: No    Physically Abused: No    Sexually Abused: No    Review of Systems: See HPI, otherwise negative ROS  Physical Exam: BP (!) 146/59   Resp 10   Ht 5' 9.02" (1.753 m)   Wt 91.6 kg   SpO2 95%   BMI 29.82 kg/m  General:   Alert,  pleasant and cooperative in NAD Head:  Normocephalic and atraumatic. Lungs:  Clear to auscultation.    Heart:  Regular rate and rhythm.   Impression/Plan: Curtis [name] is here for ophthalmic surgery.  Risks, benefits, limitations, and alternatives regarding ophthalmic surgery have been reviewed with the  patient.  Questions have been answered.  All parties agreeable.   Leandrew Koyanagi, MD  09/05/2022, 12:19 PM

## 2022-09-05 NOTE — Transfer of Care (Signed)
Immediate Anesthesia Transfer of Care Note  Patient: Curtis [name]  Procedure(s) Performed: CATARACT EXTRACTION PHACO AND INTRAOCULAR LENS PLACEMENT (IOC) LEFT 4.99 00:44.5 (Left: Eye)  Patient Location: PACU  Anesthesia Type:MAC  Level of Consciousness: awake, alert , and oriented  Airway & Oxygen Therapy: Patient Spontanous Breathing  Post-op Assessment: Report given to RN  Post vital signs: Reviewed and stable  Last Vitals:  Vitals Value Taken Time  BP 121/50 09/05/22 1329  Temp 36.1 C 09/05/22 1329  Pulse 58 09/05/22 1330  Resp 10 09/05/22 1330  SpO2 96 % 09/05/22 1330  Vitals shown include unvalidated device data.  Last Pain:  Vitals:   09/05/22 1329  TempSrc:   PainSc: 0-No pain         Complications: No notable events documented.

## 2022-09-05 NOTE — Anesthesia Postprocedure Evaluation (Signed)
Anesthesia Post Note  Patient: Rushil Kimbrell Guadarrama  Procedure(s) Performed: CATARACT EXTRACTION PHACO AND INTRAOCULAR LENS PLACEMENT (IOC) LEFT 4.99 00:44.5 (Left: Eye)  Patient location during evaluation: PACU Anesthesia Type: MAC Level of consciousness: awake and alert Pain management: pain level controlled Vital Signs Assessment: post-procedure vital signs reviewed and stable Respiratory status: spontaneous breathing, nonlabored ventilation, respiratory function stable and patient connected to nasal cannula oxygen Cardiovascular status: stable and blood pressure returned to baseline Postop Assessment: no apparent nausea or vomiting Anesthetic complications: no   No notable events documented.   Last Vitals:  Vitals:   09/05/22 1207 09/05/22 1329  BP: (!) 146/59 (!) 121/50  Pulse:  65  Resp: 10 14  Temp:  (!) 36.1 C  SpO2: 95% 97%    Last Pain:  Vitals:   09/05/22 1329  TempSrc:   PainSc: 0-No pain                 Ilene Qua

## 2022-09-06 ENCOUNTER — Encounter: Payer: Self-pay | Admitting: Ophthalmology

## 2022-09-10 ENCOUNTER — Other Ambulatory Visit: Payer: Medicare Other

## 2022-09-10 ENCOUNTER — Other Ambulatory Visit: Payer: Self-pay

## 2022-09-10 DIAGNOSIS — N138 Other obstructive and reflux uropathy: Secondary | ICD-10-CM

## 2022-09-10 DIAGNOSIS — E291 Testicular hypofunction: Secondary | ICD-10-CM

## 2022-09-10 DIAGNOSIS — E349 Endocrine disorder, unspecified: Secondary | ICD-10-CM

## 2022-09-11 LAB — HEMOGLOBIN: Hemoglobin: 14.6 g/dL (ref 13.0–17.7)

## 2022-09-11 LAB — TESTOSTERONE: Testosterone: 493 ng/dL (ref 264–916)

## 2022-09-11 LAB — PSA: Prostate Specific Ag, Serum: 2.1 ng/mL (ref 0.0–4.0)

## 2022-09-11 LAB — HEMATOCRIT: Hematocrit: 43.1 % (ref 37.5–51.0)

## 2022-09-12 ENCOUNTER — Encounter: Payer: Self-pay | Admitting: Ophthalmology

## 2022-09-12 NOTE — Progress Notes (Unsigned)
09/13/2022 9:58 AM   Curtis [name] Jan 25, 1951 [id number]  Referring provider: Virginia Crews, MD [address] [place],  [place] [id number]  Chief Complaint  Patient presents with   Follow-up    1 year follow-up   Urological history: 1. Testosterone deficiency -contributing factors of age and obesity -Testosterone level (08/2022) 493 -Hemoglobin/hematocrit (08/2022) 14.6/43.1  -testosterone cypionate 200 mg/cc, 0.5 cc every 14 days   2. BPH with LU TS -PSA (08/2022) 2.1 -I PSS 7/1  3. ED -contributing factors of age, BPH, testosterone deficiency, HTN, osteoarthritis of the lumbar region and HLD -SHIM 11 -sildenafil 20 mg, on-demand-dosing  HPI: Curtis [name] is a 72 y.o. male who presents today for 6 month follow up.   He is having a weak urinary stream.  Patient denies any modifying or aggravating factors.  Patient denies any gross hematuria, dysuria or suprapubic/flank pain.  Patient denies any fevers, chills, nausea or vomiting.     IPSS     Row Name 09/13/22 0900         International Prostate Symptom Score   How often have you had the sensation of not emptying your bladder? Not at All     How often have you had to urinate less than every two hours? Not at All     How often have you found you stopped and started again several times when you urinated? Less than half the time     How often have you found it difficult to postpone urination? Less than 1 in 5 times     How often have you had a weak urinary stream? About half the time     How often have you had to strain to start urination? Not at All     How many times did you typically get up at night to urinate? 1 Time     Total IPSS Score 7       Quality of Life due to urinary symptoms   If you were to spend the rest of your life with your urinary condition just the way it is now how would you feel about that? Pleased                Score:  1-7 Mild 8-19 Moderate 20-35  Severe  Patient is not having spontaneous erections.  He denies any pain or curvature with erections.  PDE5i's causes heart burn.    SHIM     Row Name 09/13/22 0916         SHIM: Over the last 6 months:   How do you rate your confidence that you could get and keep an erection? Very Low     When you had erections with sexual stimulation, how often were your erections hard enough for penetration (entering your partner)? A Few Times (much less than half the time)     During sexual intercourse, how often were you able to maintain your erection after you had penetrated (entered) your partner? A Few Times (much less than half the time)     During sexual intercourse, how difficult was it to maintain your erection to completion of intercourse? Difficult     When you attempted sexual intercourse, how often was it satisfactory for you? Sometimes (about half the time)       SHIM Total Score   SHIM 11                Score: 1-7 Severe ED 8-11 Moderate ED 12-16  Mild-Moderate ED 17-21 Mild ED 22-25 No ED  PMH: Past Medical History:  Diagnosis Date   Arthritis    Gout    HTN (hypertension)    Hx of dysplastic nevus 12/14/2019   L lower flank Moderate atypia   Hyperlipidemia    Hypogonadism in male    Low back pain    Morton neuroma, left    Pre-diabetes    Vertigo    none for several years   Surgical History: Past Surgical History:  Procedure Laterality Date   ACHILLES TENDON SURGERY Right 1995   CATARACT EXTRACTION W/PHACO Left 09/05/2022   Procedure: CATARACT EXTRACTION PHACO AND INTRAOCULAR LENS PLACEMENT ([place]) LEFT 4.99 00:44.5;  Surgeon: Leandrew Koyanagi, MD;  Location: [facility];  Service: Ophthalmology;  Laterality: Left;   HERNIA REPAIR  2002   double   Surgery on Left pinky in 2000     Home Medications:  Allergies as of 09/13/2022   No Known Allergies      Medication List        Accurate as of September 13, 2022  9:58 AM. If you have any  questions, ask your nurse or doctor.          allopurinol 300 MG tablet Commonly known as: ZYLOPRIM TAKE 1 TABLET BY MOUTH DAILY   amLODipine 10 MG tablet Commonly known as: NORVASC Take 1 tablet (10 mg total) by mouth daily.   BD SafetyGlide Shielded Needle 21G X 1-1/2" Misc Generic drug: NEEDLE (DISP) 21 G 0.5 mLs by Does not apply route every 14 (fourteen) days.   cholecalciferol 25 MCG (1000 UNIT) tablet Commonly known as: VITAMIN D3 Take 2,000 Units by mouth daily.   colchicine 0.6 MG tablet Take 1 tablet (0.6 mg total) by mouth 2 (two) times daily as needed (gout flares).   lisinopril 20 MG tablet Commonly known as: ZESTRIL TAKE 1 TABLET BY MOUTH DAILY.   lisinopril-hydrochlorothiazide 20-25 MG tablet Commonly known as: ZESTORETIC TAKE 1 TABLET BY MOUTH DAILY.   multivitamin capsule Take 1 capsule by mouth daily.   pravastatin 40 MG tablet Commonly known as: PRAVACHOL Take 1 tablet (40 mg total) by mouth daily.   SAFETY-GARD Needle 18G Misc Generic drug: Hypodermic Needles-Disposable 0.5 mLs by Does not apply route every 14 (fourteen) days.   sildenafil 20 MG tablet Commonly known as: REVATIO TAKE 1 TABLET BY MOUTH AS NEEDED, TAKE 3-5 TABLETS AS NEEDED PROIR TO INTERCOUSE   Syringe (Disposable) 3 ML Misc 0.5 mLs by Does not apply route every 14 (fourteen) days.   Tart Cherry 1200 MG Caps Take 1,200 mg by mouth daily in the afternoon.   testosterone cypionate 200 MG/ML injection Commonly known as: DEPOTESTOSTERONE CYPIONATE Inject 0.5 cc every fourteen days   Turmeric 500 MG Caps Take by mouth.   Zinc 50 MG Tabs Take 50 mg by mouth daily in the afternoon.       Allergies: No Known Allergies  Family History: Family History  Problem Relation Age of Onset   Hypertension Mother    Arthritis Mother    Vision loss Mother        due to age   Cancer Father        in the liver   Social History:  reports that he has never smoked. He has never  been exposed to tobacco smoke. He quit smokeless tobacco use about 15 years ago.  His smokeless tobacco use included snuff and chew. He reports current alcohol use of about 2.0 -  4.0 standard drinks of alcohol per week. He reports that he does not use drugs.  For pertinent review of systems please refer to history of present illness  Physical Exam: BP (!) 182/48   Pulse 76   Ht 5' 9"$  (1.753 m)   Wt 196 lb (88.9 kg)   BMI 28.94 kg/m   Constitutional:  Well nourished. Alert and oriented, No acute distress. HEENT: [place] AT, moist mucus membranes.  Trachea midline Cardiovascular: No clubbing, cyanosis, or edema. Respiratory: Normal respiratory effort, no increased work of breathing. GU: No CVA tenderness.  No bladder fullness or masses.  Patient with circumcised phallus.  Urethral meatus is patent.  No penile discharge. No penile lesions or rashes. Scrotum without lesions, cysts, rashes and/or edema.  Testicles are located scrotally bilaterally. No masses are appreciated in the testicles. Left and right epididymis are normal. Rectal: Patient with  normal sphincter tone. Anus and perineum without scarring or rashes. No rectal masses are appreciated. Prostate is approximately 50 + grams, could only palpate the apex and the midportion of the gland, no nodules are appreciated. Seminal vesicles are normal. Neurologic: Grossly intact, no focal deficits, moving all 4 extremities. Psychiatric: Normal mood and affect.   Laboratory Data: Lab Results  Component Value Date   TESTOSTERONE 493 09/10/2022      Latest Ref Rng & Units 09/10/2022    8:09 AM 03/13/2022    8:06 AM 09/11/2021    8:59 AM  CBC  Hemoglobin 13.0 - 17.7 g/dL 14.6  14.8  14.8   Hematocrit 37.5 - 51.0 % 43.1  43.4  42.0    Uric acid (05/2022) 4.5 Serum creatinine (05/2022) 1.02 Hemoglobin A1c (05/2022) 5.6 I have independently reviewed the labs.  Pertinent imaging N/A  Assessment & Plan:    1. Testosterone deficiency  -  testosterone level is at therapeutic levels - He will continue his testosterone cypionate 200 mg/cc, 0.5 mg every 14 days-refills given   2. BPH with LUTS -PSA stable -DRE benign -continue conservative management, avoiding bladder irritants and timed voiding's -He did ask about a minimally invasive procedure for the prostate, so we did discuss UroLift and HoLEP, but he would like to defer these treatment options at this time   3. Erectile dysfunction:    -Continue sildenafil 20 mg, on-demand dosing  6 months for PSA, testosterone (one week after injection) H & [facility].  Return in about 1 year (around 09/14/2023) for IPSS, SHIM and exam, PSA, testosterone (one week after injection) [facility]  [address] [place], [place] [id number] [phone]

## 2022-09-13 ENCOUNTER — Ambulatory Visit: Payer: Medicare Other | Admitting: Urology

## 2022-09-13 ENCOUNTER — Encounter: Payer: Self-pay | Admitting: Urology

## 2022-09-13 VITALS — BP 182/48 | HR 76 | Ht 69.0 in | Wt 196.0 lb

## 2022-09-13 DIAGNOSIS — N401 Enlarged prostate with lower urinary tract symptoms: Secondary | ICD-10-CM | POA: Diagnosis not present

## 2022-09-13 DIAGNOSIS — N138 Other obstructive and reflux uropathy: Secondary | ICD-10-CM | POA: Diagnosis not present

## 2022-09-13 DIAGNOSIS — N529 Male erectile dysfunction, unspecified: Secondary | ICD-10-CM

## 2022-09-13 DIAGNOSIS — E291 Testicular hypofunction: Secondary | ICD-10-CM

## 2022-09-13 DIAGNOSIS — E349 Endocrine disorder, unspecified: Secondary | ICD-10-CM

## 2022-09-13 MED ORDER — TESTOSTERONE CYPIONATE 200 MG/ML IM SOLN: INTRAMUSCULAR | 0 refills | Status: DC

## 2022-09-13 MED ORDER — "BD SAFETYGLIDE SHIELDED NEEDLE 21G X 1-1/2"" MISC": 0.5000 mL | 5 refills | Status: AC

## 2022-09-13 MED ORDER — SYRINGE (DISPOSABLE) 3 ML MISC: 0.5000 mL | 5 refills | Status: AC

## 2022-09-13 MED ORDER — SILDENAFIL CITRATE 20 MG PO TABS: ORAL_TABLET | ORAL | 11 refills | Status: DC

## 2022-09-17 NOTE — Discharge Instructions (Signed)

## 2022-09-19 ENCOUNTER — Encounter: Payer: Self-pay | Admitting: Ophthalmology

## 2022-09-19 ENCOUNTER — Ambulatory Visit: Payer: Medicare Other | Admitting: Anesthesiology

## 2022-09-19 ENCOUNTER — Other Ambulatory Visit: Payer: Self-pay

## 2022-09-19 ENCOUNTER — Ambulatory Visit
Admission: RE | Admit: 2022-09-19 | Discharge: 2022-09-19 | Disposition: A | Discharge status: Home / Self Care | Payer: Medicare Other | Attending: Ophthalmology | Admitting: Ophthalmology

## 2022-09-19 ENCOUNTER — Encounter
Admission: RE | Disposition: A | Discharge status: Home / Self Care | Payer: Self-pay | Source: Home / Self Care | Attending: Ophthalmology

## 2022-09-19 DIAGNOSIS — M109 Gout, unspecified: Secondary | ICD-10-CM | POA: Diagnosis not present

## 2022-09-19 DIAGNOSIS — E785 Hyperlipidemia, unspecified: Secondary | ICD-10-CM | POA: Insufficient documentation

## 2022-09-19 DIAGNOSIS — H2511 Age-related nuclear cataract, right eye: Secondary | ICD-10-CM | POA: Diagnosis not present

## 2022-09-19 DIAGNOSIS — I10 Essential (primary) hypertension: Secondary | ICD-10-CM | POA: Insufficient documentation

## 2022-09-19 DIAGNOSIS — M1990 Unspecified osteoarthritis, unspecified site: Secondary | ICD-10-CM | POA: Diagnosis not present

## 2022-09-19 DIAGNOSIS — G709 Myoneural disorder, unspecified: Secondary | ICD-10-CM | POA: Diagnosis not present

## 2022-09-19 HISTORY — PX: CATARACT EXTRACTION W/PHACO: SHX586

## 2022-09-19 SURGERY — PHACOEMULSIFICATION, CATARACT, WITH IOL INSERTION
Anesthesia: Monitor Anesthesia Care | Site: Eye | Laterality: Right

## 2022-09-19 MED ORDER — SIGHTPATH DOSE#1 BSS IO SOLN
INTRAOCULAR | Status: DC | PRN
  Administered 2022-09-19: 79 mL via OPHTHALMIC

## 2022-09-19 MED ORDER — SODIUM CHLORIDE 0.9% FLUSH
INTRAVENOUS | Status: DC | PRN
  Administered 2022-09-19: 10 mL via INTRAVENOUS

## 2022-09-19 MED ORDER — FENTANYL CITRATE PF 50 MCG/ML IJ SOSY: 25.0000 ug | PREFILLED_SYRINGE | INTRAMUSCULAR | Status: DC | PRN

## 2022-09-19 MED ORDER — BRIMONIDINE TARTRATE-TIMOLOL 0.2-0.5 % OP SOLN
OPHTHALMIC | Status: DC | PRN
  Administered 2022-09-19: 1 [drp] via OPHTHALMIC

## 2022-09-19 MED ORDER — LACTATED RINGERS IV SOLN: INTRAVENOUS | Status: DC

## 2022-09-19 MED ORDER — SIGHTPATH DOSE#1 NA HYALUR & NA CHOND-NA HYALUR IO KIT
PACK | INTRAOCULAR | Status: DC | PRN
  Administered 2022-09-19: 1 via OPHTHALMIC

## 2022-09-19 MED ORDER — TETRACAINE HCL 0.5 % OP SOLN
1.0000 [drp] | OPHTHALMIC | Status: DC | PRN
  Administered 2022-09-19 (×3): 1 [drp] via OPHTHALMIC

## 2022-09-19 MED ORDER — CEFUROXIME OPHTHALMIC INJECTION 1 MG/0.1 ML
INJECTION | OPHTHALMIC | Status: DC | PRN
  Administered 2022-09-19: .1 mL via INTRACAMERAL

## 2022-09-19 MED ORDER — SIGHTPATH DOSE#1 BSS IO SOLN: INTRAOCULAR | Status: DC | PRN

## 2022-09-19 MED ORDER — SIGHTPATH DOSE#1 BSS IO SOLN
INTRAOCULAR | Status: DC | PRN
  Administered 2022-09-19: 15 mL

## 2022-09-19 MED ORDER — OXYCODONE HCL 5 MG/5ML PO SOLN: 5.0000 mg | Freq: Once | ORAL | Status: DC | PRN

## 2022-09-19 MED ORDER — MIDAZOLAM HCL 2 MG/2ML IJ SOLN
INTRAMUSCULAR | Status: DC | PRN
  Administered 2022-09-19: 1 mg via INTRAVENOUS

## 2022-09-19 MED ORDER — OXYCODONE HCL 5 MG PO TABS: 5.0000 mg | ORAL_TABLET | Freq: Once | ORAL | Status: DC | PRN

## 2022-09-19 MED ORDER — ARMC OPHTHALMIC DILATING DROPS
1.0000 | OPHTHALMIC | Status: DC | PRN
  Administered 2022-09-19 (×3): 1 via OPHTHALMIC

## 2022-09-19 MED ORDER — FENTANYL CITRATE (PF) 100 MCG/2ML IJ SOLN
INTRAMUSCULAR | Status: DC | PRN
  Administered 2022-09-19: 50 ug via INTRAVENOUS

## 2022-09-19 SURGICAL SUPPLY — 10 items
CATARACT SUITE SIGHTPATH (MISCELLANEOUS) ×1 IMPLANT
FEE CATARACT SUITE SIGHTPATH (MISCELLANEOUS) ×1 IMPLANT
GLOVE SRG 8 PF TXTR STRL LF DI (GLOVE) ×1 IMPLANT
GLOVE SURG ENC TEXT LTX SZ7.5 (GLOVE) ×1 IMPLANT
GLOVE SURG UNDER POLY LF SZ8 (GLOVE) ×1
LENS IOL TECNIS EYHANCE 21.0 (Intraocular Lens) IMPLANT
NDL FILTER BLUNT 18X1 1/2 (NEEDLE) ×1 IMPLANT
NEEDLE FILTER BLUNT 18X1 1/2 (NEEDLE) ×1 IMPLANT
SYR 3ML LL SCALE MARK (SYRINGE) ×1 IMPLANT
WATER STERILE IRR 250ML POUR (IV SOLUTION) ×1 IMPLANT

## 2022-09-19 NOTE — Anesthesia Procedure Notes (Signed)
Procedure Name: MAC Date/Time: 09/19/2022 9:37 AM  Performed by: Hilbert Odor, CRNAPre-anesthesia Checklist: Patient identified, Emergency Drugs available, Suction available, Patient being monitored and Timeout performed Patient Re-evaluated:Patient Re-evaluated prior to induction Oxygen Delivery Method: Nasal cannula

## 2022-09-19 NOTE — Op Note (Signed)
LOCATION:  [facility]   PREOPERATIVE DIAGNOSIS:    Nuclear sclerotic cataract right eye. H25.11   POSTOPERATIVE DIAGNOSIS:  Nuclear sclerotic cataract right eye.     PROCEDURE:  Phacoemusification with posterior chamber intraocular lens placement of the right eye   ULTRASOUND TIME: Procedure(s): CATARACT EXTRACTION PHACO AND INTRAOCULAR LENS PLACEMENT (IOC) RIGHT  5.84  00:42.1 (Right)  LENS:   Implant Name Type Inv. Item Serial No. Manufacturer Lot No. LRB No. Used Action  LENS IOL TECNIS EYHANCE 21.0 - [id number] Intraocular Lens LENS IOL TECNIS EYHANCE 21.0 [id number] [employer]  Right 1 Implanted         SURGEON:  Wyonia Hough, MD   ANESTHESIA:  Topical with tetracaine drops and 2% Xylocaine jelly, augmented with 1% preservative-free intracameral lidocaine.    COMPLICATIONS:  None.   DESCRIPTION OF PROCEDURE:  The patient was identified in the holding room and transported to the operating room and placed in the supine position under the operating microscope.  The right eye was identified as the operative eye and it was prepped and draped in the usual sterile ophthalmic fashion.   A 1 millimeter clear-corneal paracentesis was made at the 12:00 position.  0.5 ml of preservative-free 1% lidocaine was injected into the anterior chamber. The anterior chamber was filled with Viscoat viscoelastic.  A 2.4 millimeter keratome was used to make a near-clear corneal incision at the 9:00 position.  A curvilinear capsulorrhexis was made with a cystotome and capsulorrhexis forceps.  Balanced salt solution was used to hydrodissect and hydrodelineate the nucleus.   Phacoemulsification was then used in stop and chop fashion to remove the lens nucleus and epinucleus.  The remaining cortex was then removed using the irrigation and aspiration handpiece. Provisc was then placed into the capsular bag to distend it for lens placement.  A lens was then injected into the capsular bag.   The remaining viscoelastic was aspirated.   Wounds were hydrated with balanced salt solution.  The anterior chamber was inflated to a physiologic pressure with balanced salt solution.  No wound leaks were noted. Cefuroxime 0.1 ml of a 16m/ml solution was injected into the anterior chamber for a dose of 1 mg of intracameral antibiotic at the completion of the case.   Timolol and Brimonidine drops were applied to the eye.  The patient was taken to the recovery room in stable condition without complications of anesthesia or surgery.   Jacody Beneke 09/19/2022, 9:50 AM

## 2022-09-19 NOTE — Anesthesia Postprocedure Evaluation (Signed)
Anesthesia Post Note  Patient: Curtis [name]  Procedure(s) Performed: CATARACT EXTRACTION PHACO AND INTRAOCULAR LENS PLACEMENT (IOC) RIGHT  5.84  00:42.1 (Right: Eye)  Patient location during evaluation: Phase II Anesthesia Type: MAC Level of consciousness: awake and alert Pain management: pain level controlled Vital Signs Assessment: post-procedure vital signs reviewed and stable Respiratory status: spontaneous breathing, nonlabored ventilation, respiratory function stable and patient connected to nasal cannula oxygen Cardiovascular status: stable and blood pressure returned to baseline Postop Assessment: no apparent nausea or vomiting Anesthetic complications: no   No notable events documented.   Last Vitals:  Vitals:   09/19/22 0952 09/19/22 0957  BP: (!) 130/53 (!) 118/53  Pulse: 62 66  Resp: 18 11  Temp: (!) 36.4 C (!) 36.4 C  SpO2: 95% 95%    Last Pain:  Vitals:   09/19/22 0957  TempSrc:   PainSc: 0-No pain                 Precious Haws [name]

## 2022-09-19 NOTE — Anesthesia Preprocedure Evaluation (Signed)
Anesthesia Evaluation  Patient identified by MRN, date of birth, ID band Patient awake    Reviewed: Allergy & Precautions, NPO status , Patient's Chart, lab work & pertinent test results  History of Anesthesia Complications Negative for: history of anesthetic complications  Airway Mallampati: III  TM Distance: <3 FB Neck ROM: full    Dental  (+) Chipped   Pulmonary neg pulmonary ROS, neg shortness of breath   Pulmonary exam normal        Cardiovascular Exercise Tolerance: Good hypertension, (-) angina (-) Past MI Normal cardiovascular exam     Neuro/Psych  Neuromuscular disease  negative psych ROS   GI/Hepatic negative GI ROS, Neg liver ROS,neg GERD  ,,  Endo/Other  negative endocrine ROS    Renal/GU      Musculoskeletal   Abdominal   Peds  Hematology negative hematology ROS (+)   Anesthesia Other Findings Past Medical History: No date: Arthritis No date: Gout No date: HTN (hypertension) 12/14/2019: Hx of dysplastic nevus     Comment:  L lower flank Moderate atypia No date: Hyperlipidemia No 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 No date: Low back pain No date: Morton neuroma, left No date: Pre-diabetes No date: Vertigo     Comment:  none for several years  Past Surgical History: 1995: ACHILLES TENDON SURGERY; Right 09/05/2022: CATARACT EXTRACTION W/PHACO; Left     Comment:  Procedure: CATARACT EXTRACTION PHACO AND INTRAOCULAR               LENS PLACEMENT (IOC) LEFT 4.99 00:44.5;  Surgeon:               Leandrew Koyanagi, MD;  Location: [place];              Service: Ophthalmology;  Laterality: Left; 2002: HERNIA REPAIR     Comment:  double No date: Surgery on Left pinky in 2000  BMI    Body Mass Index: 29.83 kg/m      Reproductive/Obstetrics negative OB ROS                             Anesthesia Physical Anesthesia Plan  ASA: 2  Anesthesia Plan: MAC   Post-op  Pain Management:    Induction: Intravenous  PONV Risk Score and Plan:   Airway Management Planned: Natural Airway and Nasal Cannula  Additional Equipment:   Intra-op Plan:   Post-operative Plan:   Informed Consent: I have reviewed the patients History and Physical, chart, labs and discussed the procedure including the risks, benefits and alternatives for the proposed anesthesia with the patient or authorized representative who has indicated his/her understanding and acceptance.     Dental Advisory Given  Plan Discussed with: Anesthesiologist, CRNA and Surgeon  Anesthesia Plan Comments: (Patient consented for risks of anesthesia including but not limited to:  - adverse reactions to medications - damage to eyes, teeth, lips or other oral mucosa - nerve damage due to positioning  - sore throat or hoarseness - Damage to heart, brain, nerves, lungs, other parts of body or loss of life  Patient voiced understanding.)       Anesthesia Quick Evaluation

## 2022-09-19 NOTE — H&P (Signed)
[facility]   Primary Care Physician:  Virginia Crews, MD Ophthalmologist: Dr. Leandrew Koyanagi  Pre-Procedure History & Physical: HPI:  Curtis [name] is a 72 y.o. male here for ophthalmic surgery.   Past Medical History:  Diagnosis Date   Arthritis    Gout    HTN (hypertension)    Hx of dysplastic nevus 12/14/2019   L lower flank Moderate atypia   Hyperlipidemia    Hypogonadism in male    Low back pain    Morton neuroma, left    Pre-diabetes    Vertigo    none for several years    Past Surgical History:  Procedure Laterality Date   ACHILLES TENDON SURGERY Right 1995   CATARACT EXTRACTION W/PHACO Left 09/05/2022   Procedure: CATARACT EXTRACTION PHACO AND INTRAOCULAR LENS PLACEMENT ([place]) LEFT 4.99 00:44.5;  Surgeon: Leandrew Koyanagi, MD;  Location: [place];  Service: Ophthalmology;  Laterality: Left;   HERNIA REPAIR  2002   double   Surgery on Left pinky in 2000      Prior to Admission medications   Medication Sig Start Date End Date Taking? Authorizing Provider  allopurinol (ZYLOPRIM) 300 MG tablet TAKE 1 TABLET BY MOUTH DAILY 07/12/22  Yes Hulan Saas M, DO  amLODipine (NORVASC) 10 MG tablet Take 1 tablet (10 mg total) by mouth daily. 12/07/21  Yes Bacigalupo, Dionne Bucy, MD  cholecalciferol (VITAMIN D3) 25 MCG (1000 UNIT) tablet Take 2,000 Units by mouth daily.   Yes [provider]  colchicine 0.6 MG tablet Take 1 tablet (0.6 mg total) by mouth 2 (two) times daily as needed (gout flares). 06/11/22  Yes Bacigalupo, Dionne Bucy, MD  Hypodermic Needles-Disposable [facility] NEEDLE 18G) MISC 0.5 mLs by Does not apply route every 14 (fourteen) days. 09/13/21  Yes McGowan, Larene Beach A, PA-C  lisinopril (ZESTRIL) 20 MG tablet TAKE 1 TABLET BY MOUTH DAILY. 08/30/22  Yes Bacigalupo, Dionne Bucy, MD  lisinopril-hydrochlorothiazide (ZESTORETIC) 20-25 MG tablet TAKE 1 TABLET BY MOUTH DAILY. 08/30/22  Yes Bacigalupo, Dionne Bucy, MD  Multiple  Vitamin (MULTIVITAMIN) capsule Take 1 capsule by mouth daily.   Yes [provider]  NEEDLE, DISP, 21 G (BD SAFETYGLIDE SHIELDED NEEDLE) 21G X 1-1/2" MISC 0.5 mLs by Does not apply route every 14 (fourteen) days. 09/13/22  Yes McGowan, Larene Beach A, PA-C  pravastatin (PRAVACHOL) 40 MG tablet Take 1 tablet (40 mg total) by mouth daily. 12/07/21  Yes Bacigalupo, Dionne Bucy, MD  sildenafil (REVATIO) 20 MG tablet TAKE 1 TABLET BY MOUTH AS NEEDED, TAKE 3-5 TABLETS AS NEEDED PROIR TO INTERCOUSE 09/13/22  Yes McGowan, Larene Beach A, PA-C  Syringe, Disposable, 3 ML MISC 0.5 mLs by Does not apply route every 14 (fourteen) days. 09/13/22  Yes McGowan, Larene Beach A, PA-C  Tart Cherry 1200 MG CAPS Take 1,200 mg by mouth daily in the afternoon.   Yes [provider]  testosterone cypionate (DEPOTESTOSTERONE CYPIONATE) 200 MG/ML injection Inject 0.5 cc every fourteen days 09/13/22  Yes McGowan, Larene Beach A, PA-C  Turmeric 500 MG CAPS Take by mouth.   Yes [provider]  Zinc 50 MG TABS Take 50 mg by mouth daily in the afternoon.   Yes [provider]    Allergies as of 05/15/2022   (No Known Allergies)    Family History  Problem Relation Age of Onset   Hypertension Mother    Arthritis Mother    Vision loss Mother        due to age  Cancer Father        in the liver    Social History   Socioeconomic History   Marital status: Married    Spouse name: Not on file   Number of children: 2   Years of education: Not on file   Highest education level: Bachelor's degree (e.g., BA, AB, BS)  Occupational History   Occupation: Camera operator    Comment: retired  Tobacco Use   Smoking status: Never    Passive exposure: Never   Smokeless tobacco: Former    Types: Snuff, Chew    Quit date: 2009   Tobacco comments:    quit snuff at age 53  Vaping Use   Vaping Use: Never used  Substance and Sexual Activity   Alcohol use: Yes    Alcohol/week: 2.0 - 4.0 standard drinks of  alcohol    Types: 2 - 4 Cans of beer per week   Drug use: No   Sexual activity: Not on file  Other Topics Concern   Not on file  Social History Narrative   Not on file   Social Determinants of Health   Financial Resource Strain: Low Risk  (09/20/2021)   Overall Financial Resource Strain (CARDIA)    Difficulty of Paying Living Expenses: Not hard at all  Food Insecurity: No Food Insecurity (09/20/2021)   Hunger Vital Sign    Worried About Running Out of Food in the Last Year: Never true    Ran Out of Food in the Last Year: Never true  Transportation Needs: No Transportation Needs (09/20/2021)   PRAPARE - Hydrologist (Medical): No    Lack of Transportation (Non-Medical): No  Physical Activity: Sufficiently Active (09/20/2021)   Exercise Vital Sign    Days of Exercise per Week: 4 days    Minutes of Exercise per Session: 60 min  Stress: No Stress Concern Present (09/20/2021)   [place]    Feeling of Stress : Not at all  Social Connections: Moderately Integrated (09/20/2021)   Social Connection and Isolation Panel [NHANES]    Frequency of Communication with Friends and Family: More than three times a week    Frequency of Social Gatherings with Friends and Family: More than three times a week    Attends Religious Services: More than 4 times per year    Active Member of [employer] or Organizations: No    Attends Archivist Meetings: Never    Marital Status: Married  Human resources officer Violence: Not At Risk (09/20/2021)   Humiliation, Afraid, Rape, and Kick questionnaire    Fear of Current or Ex-Partner: No    Emotionally Abused: No    Physically Abused: No    Sexually Abused: No    Review of Systems: See HPI, otherwise negative ROS  Physical Exam: BP (!) 138/56   Pulse 64   Temp 97.7 F (36.5 C) (Temporal)   Resp 18   Ht 5' 9"$  (1.753 m)   Wt 91.6 kg   SpO2 95%   BMI 29.83  kg/m  General:   Alert,  pleasant and cooperative in NAD Head:  Normocephalic and atraumatic. Lungs:  Clear to auscultation.    Heart:  Regular rate and rhythm.   Impression/Plan: Curtis [name] is here for ophthalmic surgery.  Risks, benefits, limitations, and alternatives regarding ophthalmic surgery have been reviewed with the patient.  Questions have been answered.  All parties agreeable.   Leandrew Koyanagi, MD  09/19/2022, 9:06 AM

## 2022-09-19 NOTE — Transfer of Care (Signed)
Immediate Anesthesia Transfer of Care Note  Patient: Curtis [name]  Procedure(s) Performed: CATARACT EXTRACTION PHACO AND INTRAOCULAR LENS PLACEMENT (IOC) RIGHT  5.84  00:42.1 (Right: Eye)  Patient Location: PACU  Anesthesia Type: MAC  Level of Consciousness: awake, alert  and patient cooperative  Airway and Oxygen Therapy: Patient Spontanous Breathing and Patient connected to supplemental oxygen  Post-op Assessment: Post-op Vital signs reviewed, Patient's Cardiovascular Status Stable, Respiratory Function Stable, Patent Airway and No signs of Nausea or vomiting  Post-op Vital Signs: Reviewed and stable  Complications: No notable events documented.

## 2022-09-20 ENCOUNTER — Encounter: Payer: Self-pay | Admitting: Ophthalmology

## 2022-09-24 ENCOUNTER — Ambulatory Visit (INDEPENDENT_AMBULATORY_CARE_PROVIDER_SITE_OTHER): Payer: Medicare Other

## 2022-09-24 ENCOUNTER — Encounter: Payer: Self-pay | Admitting: Family Medicine

## 2022-09-24 VITALS — Ht 69.0 in | Wt 202.0 lb

## 2022-09-24 DIAGNOSIS — Z0000 Encounter for general adult medical examination without abnormal findings: Secondary | ICD-10-CM | POA: Diagnosis not present

## 2022-09-24 NOTE — Progress Notes (Signed)
I connected with  Curtis [name] on 09/24/22 by a audio telephone and verified that I am speaking with the correct person using two identifiers.  Patient Location: Home  Provider Location: Office/Clinic  I discussed the limitations of evaluation and management by telemedicine. The patient expressed understanding and agreed to proceed.  Subjective:   Curtis [name] is a 72 y.o. male who presents for Medicare Annual/Subsequent preventive examination.  Review of Systems         Objective:    Today's Vitals   09/24/22 1018 09/24/22 1019  Weight: 202 lb (91.6 kg)   Height: '5\' 9"'$  (1.753 m)   PainSc:  2    Body mass index is 29.83 kg/m.     09/24/2022   10:30 AM 09/19/2022    8:51 AM 09/05/2022   11:58 AM 09/20/2021   10:28 AM 01/25/2020    9:33 AM 01/06/2019   10:23 AM 01/02/2018    9:49 AM  Advanced Directives  Does Patient Have a Medical Advance Directive? Yes Yes Yes No Yes Yes Yes  Type of Advance Directive   Living will;Healthcare Power of [place];Living will Living will;Healthcare Power of [place];Living will  Copy of [place] in Chart? No - copy requested No - copy requested No - copy requested  No - copy requested No - copy requested No - copy requested  Would patient like information on creating a medical advance directive?    No - Patient declined       Current Medications (verified) Outpatient Encounter Medications as of 09/24/2022  Medication Sig   allopurinol (ZYLOPRIM) 300 MG tablet TAKE 1 TABLET BY MOUTH DAILY   amLODipine (NORVASC) 10 MG tablet Take 1 tablet (10 mg total) by mouth daily.   cholecalciferol (VITAMIN D3) 25 MCG (1000 UNIT) tablet Take 2,000 Units by mouth daily.   colchicine 0.6 MG tablet Take 1 tablet (0.6 mg total) by mouth 2 (two) times daily as needed (gout flares).   Hypodermic Needles-Disposable (SAFETY-GARD NEEDLE 18G) MISC 0.5 mLs by Does not apply route  every 14 (fourteen) days.   lisinopril (ZESTRIL) 20 MG tablet TAKE 1 TABLET BY MOUTH DAILY.   lisinopril-hydrochlorothiazide (ZESTORETIC) 20-25 MG tablet TAKE 1 TABLET BY MOUTH DAILY.   Multiple Vitamin (MULTIVITAMIN) capsule Take 1 capsule by mouth daily.   NEEDLE, DISP, 21 G (BD SAFETYGLIDE SHIELDED NEEDLE) 21G X 1-1/2" MISC 0.5 mLs by Does not apply route every 14 (fourteen) days.   pravastatin (PRAVACHOL) 40 MG tablet Take 1 tablet (40 mg total) by mouth daily.   sildenafil (REVATIO) 20 MG tablet TAKE 1 TABLET BY MOUTH AS NEEDED, TAKE 3-5 TABLETS AS NEEDED PROIR TO INTERCOUSE   Syringe, Disposable, 3 ML MISC 0.5 mLs by Does not apply route every 14 (fourteen) days.   Tart Cherry 1200 MG CAPS Take 1,200 mg by mouth daily in the afternoon.   testosterone cypionate (DEPOTESTOSTERONE CYPIONATE) 200 MG/ML injection Inject 0.5 cc every fourteen days   Turmeric 500 MG CAPS Take by mouth.   Zinc 50 MG TABS Take 50 mg by mouth daily in the afternoon.   No facility-administered encounter medications on file as of 09/24/2022.    Allergies (verified) Patient has no known allergies.   History: Past Medical History:  Diagnosis Date   Arthritis    Gout    HTN (hypertension)    Hx of dysplastic nevus 12/14/2019   L lower flank Moderate atypia  Hyperlipidemia    Hypogonadism in male    Low back pain    Morton neuroma, left    Pre-diabetes    Vertigo    none for several years   Past Surgical History:  Procedure Laterality Date   ACHILLES TENDON SURGERY Right 1995   CATARACT EXTRACTION W/PHACO Left 09/05/2022   Procedure: CATARACT EXTRACTION PHACO AND INTRAOCULAR LENS PLACEMENT (IOC) LEFT 4.99 00:44.5;  Surgeon: Leandrew Koyanagi, MD;  Location: [place];  Service: Ophthalmology;  Laterality: Left;   CATARACT EXTRACTION W/PHACO Right 09/19/2022   Procedure: CATARACT EXTRACTION PHACO AND INTRAOCULAR LENS PLACEMENT ([place]) RIGHT  5.84  00:42.1;  Surgeon: Leandrew Koyanagi, MD;   Location: [place];  Service: Ophthalmology;  Laterality: Right;   HERNIA REPAIR  2002   double   Surgery on Left pinky in 2000     Family History  Problem Relation Age of Onset   Hypertension Mother    Arthritis Mother    Vision loss Mother        due to age   Cancer Father        in the liver   Social History   Socioeconomic History   Marital status: Married    Spouse name: Not on file   Number of children: 2   Years of education: Not on file   Highest education level: Bachelor's degree (e.g., BA, AB, BS)  Occupational History   Occupation: Camera operator    Comment: retired  Tobacco Use   Smoking status: Never    Passive exposure: Never   Smokeless tobacco: Former    Types: Snuff, Chew    Quit date: 2009   Tobacco comments:    quit snuff at age 19  Vaping Use   Vaping Use: Never used  Substance and Sexual Activity   Alcohol use: Yes    Alcohol/week: 2.0 - 4.0 standard drinks of alcohol    Types: 2 - 4 Cans of beer per week   Drug use: No   Sexual activity: Not on file  Other Topics Concern   Not on file  Social History Narrative   Not on file   Social Determinants of Health   Financial Resource Strain: Low Risk  (09/20/2022)   Overall Financial Resource Strain (CARDIA)    Difficulty of Paying Living Expenses: Not hard at all  Food Insecurity: No Food Insecurity (09/20/2022)   Hunger Vital Sign    Worried About Running Out of Food in the Last Year: Never true    Ran Out of Food in the Last Year: Never true  Transportation Needs: No Transportation Needs (09/20/2022)   PRAPARE - Hydrologist (Medical): No    Lack of Transportation (Non-Medical): No  Physical Activity: Sufficiently Active (09/20/2022)   Exercise Vital Sign    Days of Exercise per Week: 4 days    Minutes of Exercise per Session: 70 min  Stress: No Stress Concern Present (09/20/2022)   [place]    Feeling of Stress : Not at all  Social Connections: Unknown (09/20/2022)   Social Connection and Isolation Panel [NHANES]    Frequency of Communication with Friends and Family: More than three times a week    Frequency of Social Gatherings with Friends and Family: More than three times a week    Attends Religious Services: Not on file    Active Member of Clubs or Organizations: Yes    Attends  Music therapist: More than 4 times per year    Marital Status: Married    Tobacco Counseling Counseling given: Not Answered Tobacco comments: quit snuff at age 59   Clinical Intake:  Pre-visit preparation completed: Yes  Pain : 0-10 Pain Score: 2  Pain Type: Acute pain Pain Location: Ankle Pain Orientation: Left Pain Descriptors / Indicators: Aching Pain Onset: In the past 7 days Pain Frequency: Intermittent Pain Relieving Factors: cold compress, balm Effect of Pain on Daily Activities: walks slower  Pain Relieving Factors: cold compress, balm  BMI - recorded: 29.83 Nutritional Status: BMI 25 -29 Overweight Nutritional Risks: None Diabetes: No  How often do you need to have someone help you when you read instructions, pamphlets, or other written materials from your doctor or pharmacy?: 1 - Never  Diabetic?no  Interpreter Needed?: No  Information entered by :: B.Demita Tobia,LPN   Activities of Daily Living    09/20/2022    9:33 AM 09/19/2022    8:54 AM  In your present state of health, do you have any difficulty performing the following activities:  Hearing? 0 0  Vision? 0 0  Difficulty concentrating or making decisions? 0 0  Walking or climbing stairs? 0 0  Dressing or bathing? 0 0  Doing errands, shopping? 0   Preparing Food and eating ? N   Using the Toilet? N   In the past six months, have you accidently leaked urine? N   Do you have problems with loss of bowel control? N   Managing your Medications? N   Managing your Finances? N    Housekeeping or managing your Housekeeping? N     Patient Care Team: Virginia Crews, MD as PCP - General (Family Medicine) Laneta Simmers as Physician Assistant (Urology) Sharlotte Alamo, [place] (Podiatry) Coll, Venda Rodes., PA-C (Orthopedic Surgery) Brendolyn Patty, MD (Dermatology) Pa, [place] [facility])  Indicate any recent Medical Services you may have received from other than [facility] providers in the past year (date may be approximate).     Assessment:   This is a routine wellness examination for [place].  Hearing/Vision screen Hearing Screening - Comments:: Adequate hearing:ringing in ears Vision Screening - Comments:: Adequate vision w/cataract surgery last month [facility]   Dietary issues and exercise activities discussed:     Goals Addressed             This Visit's Progress    DIET - EAT MORE FRUITS AND VEGETABLES   On track    DIET - REDUCE SUGAR INTAKE   On track    Recommend cutting back in sweets and desserts in diet and monitor sugar intake.        Depression Screen    09/24/2022   10:26 AM 06/11/2022    8:22 AM 12/07/2021    1:23 PM 11/27/2021    9:24 AM 10/23/2021    1:32 PM 09/20/2021   10:27 AM 08/24/2021    3:24 PM  PHQ 2/9 Scores  PHQ - 2 Score 0 0 0 0 0 0 0  PHQ- 9 Score  0 0 0 0      Fall Risk    09/20/2022    9:33 AM 06/11/2022    8:22 AM 12/07/2021    1:23 PM 11/27/2021    9:25 AM 10/23/2021    1:32 PM  Fall Risk   Falls in the past year? 0 0 0 0 0  Number falls in past yr: 0 0 0  0 0  Injury with Fall? 0 0 0 0 0  Risk for fall due to :  No Fall Risks No Fall Risks  No Fall Risks  Follow up  Falls evaluation completed Falls evaluation completed  Falls evaluation completed    [place]:  Any stairs in or around the home? No  If so, are there any without handrails? No  Home free of loose throw rugs in walkways, pet beds, electrical cords, etc? Yes  Adequate lighting in  your home to reduce risk of falls? Yes   ASSISTIVE DEVICES UTILIZED TO PREVENT FALLS:  Life alert? No  Use of a cane, walker or w/c? No  Grab bars in the bathroom? Yes  Shower chair or bench in shower? Yes  Elevated toilet seat or a handicapped toilet? No   Cognitive Function:        09/24/2022   10:31 AM 01/25/2020    9:37 AM 01/06/2019   10:29 AM 01/02/2018    9:53 AM  6CIT Screen  What Year? 0 points 0 points 0 points 0 points  What month? 0 points 0 points 0 points 0 points  What time? 0 points 0 points 0 points 0 points  Count back from 20 0 points 0 points 0 points 0 points  Months in reverse 0 points 0 points 0 points 0 points  Repeat phrase 0 points 0 points 0 points 4 points  Total Score 0 points 0 points 0 points 4 points    Immunizations Immunization History  Administered Date(s) Administered   Fluad Quad(high Dose 65+) 04/14/2019, 05/12/2020   Influenza-Unspecified 05/07/2017, 04/25/2018   Moderna Sars-Covid-2 Vaccination 09/26/2019, 10/24/2019, 06/14/2020   Pneumococcal Conjugate-13 01/01/2017   Pneumococcal Polysaccharide-23 01/02/2018   Td 08/06/2001   Tdap 10/03/2010   Zoster, Live 11/11/2015    TDAP status: Due, Education has been provided regarding the importance of this vaccine. Advised may receive this vaccine at local pharmacy or Health Dept. Aware to provide a copy of the vaccination record if obtained from local pharmacy or Health Dept. Verbalized acceptance and understanding.  Flu Vaccine status: Declined, Education has been provided regarding the importance of this vaccine but patient still declined. Advised may receive this vaccine at local pharmacy or Health Dept. Aware to provide a copy of the vaccination record if obtained from local pharmacy or Health Dept. Verbalized acceptance and understanding.  Pneumococcal vaccine status: Up to date  Covid-19 vaccine status: Completed vaccines  Qualifies for Shingles Vaccine? Yes   Zostavax completed No    Shingrix Completed?: No.    Education has been provided regarding the importance of this vaccine. Patient has been advised to call insurance company to determine out of pocket expense if they have not yet received this vaccine. Advised may also receive vaccine at local pharmacy or Health Dept. Verbalized acceptance and understanding.  Screening Tests Health Maintenance  Topic Date Due   Zoster Vaccines- Shingrix (1 of 2) Never done   DTaP/Tdap/Td (3 - Td or Tdap) 10/02/2020   COVID-19 Vaccine (4 - 2023-24 season) 03/30/2022   INFLUENZA VACCINE  10/28/2022 (Originally 02/27/2022)   Medicare Annual Wellness ([place])  09/25/2023   COLONOSCOPY (Pts 45-5yr Insurance coverage will need to be confirmed)  05/14/2027   Pneumonia Vaccine 72 Years old  Completed   Hepatitis C Screening  Completed   HPV VACCINES  Aged Out    Health Maintenance  Health Maintenance Due  Topic Date Due   Zoster Vaccines-  Shingrix (1 of 2) Never done   DTaP/Tdap/Td (3 - Td or Tdap) 10/02/2020   COVID-19 Vaccine (4 - 2023-24 season) 03/30/2022    Colorectal cancer screening: Type of screening: Colonoscopy. Completed yes. Repeat every 10 years  Lung Cancer Screening: (Low Dose CT Chest recommended if Age 85-80 years, 30 pack-year currently smoking OR have quit w/in 15years.) does not qualify.   Lung Cancer Screening Referral: no  Additional Screening:  Hepatitis C Screening: does not qualify; Completed yes  Vision Screening: Recommended annual ophthalmology exams for early detection of glaucoma and other disorders of the eye. Is the patient up to date with their annual eye exam?  Yes  Who is the provider or what is the name of the office in which the patient attends annual eye exams? [place] If pt is not established with a provider, would they like to be referred to a provider to establish care? No .   Dental Screening: Recommended annual dental exams for proper oral hygiene  Community Resource Referral  / Chronic Care Management: CRR required this visit?  No   CCM required this visit?  No      Plan:     I have personally reviewed and noted the following in the patient's chart:   Medical and social history Use of alcohol, tobacco or illicit drugs  Current medications and supplements including opioid prescriptions. Patient is not currently taking opioid prescriptions. Functional ability and status Nutritional status Physical activity Advanced directives List of other physicians Hospitalizations, surgeries, and ER visits in previous 12 months Vitals Screenings to include cognitive, depression, and falls Referrals and appointments  In addition, I have reviewed and discussed with patient certain preventive protocols, quality metrics, and best practice recommendations. A written personalized care plan for preventive services as well as general preventive health recommendations were provided to patient.     Roger Shelter, LPN   [id number]   Nurse Notes: pt is doing well. Pt states he took the Colchicine recently and it eliminated ALL the symptoms associated with gout in his great toe. He inquires whether to continue Allopurinol as baseline.

## 2022-09-24 NOTE — Patient Instructions (Signed)
[name] , Thank you for taking time to come for your Medicare Wellness Visit. I appreciate your ongoing commitment to your health goals. Please review the following plan we discussed and let me know if I can assist you in the future.   These are the goals we discussed:  Goals      DIET - EAT MORE FRUITS AND VEGETABLES     DIET - REDUCE SUGAR INTAKE     Recommend cutting back in sweets and desserts in diet and monitor sugar intake.         This is a list of the screening recommended for you and due dates:  Health Maintenance  Topic Date Due   Zoster (Shingles) Vaccine (1 of 2) Never done   DTaP/Tdap/Td vaccine (3 - Td or Tdap) 10/02/2020   COVID-19 Vaccine (4 - 2023-24 season) 03/30/2022   Flu Shot  10/28/2022*   Medicare Annual Wellness Visit  09/25/2023   Colon Cancer Screening  05/14/2027   Pneumonia Vaccine  Completed   Hepatitis C Screening: USPSTF Recommendation to screen - Ages 18-79 yo.  Completed   HPV Vaccine  Aged Out  *Topic was postponed. The date shown is not the original due date.    Advanced directives: yes  Conditions/risks identified: none  Next appointment: Follow up in one year for your annual wellness visit. 09/30/2023 @ 10:15 telephone  Preventive Care 65 Years and Older, Male  Preventive care refers to lifestyle choices and visits with your health care provider that can promote health and wellness. What does preventive care include? A yearly physical exam. This is also called an annual well check. Dental exams once or twice a year. Routine eye exams. Ask your health care provider how often you should have your eyes checked. Personal lifestyle choices, including: Daily care of your teeth and gums. Regular physical activity. Eating a healthy diet. Avoiding tobacco and drug use. Limiting alcohol use. Practicing safe sex. Taking low doses of aspirin every day. Taking vitamin and mineral supplements as recommended by your health care  provider. What happens during an annual well check? The services and screenings done by your health care provider during your annual well check will depend on your age, overall health, lifestyle risk factors, and family history of disease. Counseling  Your health care provider may ask you questions about your: Alcohol use. Tobacco use. Drug use. Emotional well-being. Home and relationship well-being. Sexual activity. Eating habits. History of falls. Memory and ability to understand (cognition). Work and work Statistician. Screening  You may have the following tests or measurements: Height, weight, and BMI. Blood pressure. Lipid and cholesterol levels. These may be checked every 5 years, or more frequently if you are over 12 years old. Skin check. Lung cancer screening. You may have this screening every year starting at age 37 if you have a 30-pack-year history of smoking and currently smoke or have quit within the past 15 years. Fecal occult blood test (FOBT) of the stool. You may have this test every year starting at age 68. Flexible sigmoidoscopy or colonoscopy. You may have a sigmoidoscopy every 5 years or a colonoscopy every 10 years starting at age 23. Prostate cancer screening. Recommendations will vary depending on your family history and other risks. Hepatitis C blood test. Hepatitis B blood test. Sexually transmitted disease (STD) testing. Diabetes screening. This is done by checking your blood sugar (glucose) after you have not eaten for a while (fasting). You may have this done every 1-3 years.  Abdominal aortic aneurysm (AAA) screening. You may need this if you are a current or former smoker. Osteoporosis. You may be screened starting at age 44 if you are at high risk. Talk with your health care provider about your test results, treatment options, and if necessary, the need for more tests. Vaccines  Your health care provider may recommend certain vaccines, such  as: Influenza vaccine. This is recommended every year. Tetanus, diphtheria, and acellular pertussis (Tdap, Td) vaccine. You may need a Td booster every 10 years. Zoster vaccine. You may need this after age 61. Pneumococcal 13-valent conjugate (PCV13) vaccine. One dose is recommended after age 73. Pneumococcal polysaccharide (PPSV23) vaccine. One dose is recommended after age 82. Talk to your health care provider about which screenings and vaccines you need and how often you need them. This information is not intended to replace advice given to you by your health care provider. Make sure you discuss any questions you have with your health care provider. Document Released: 08/12/2015 Document Revised: 04/04/2016 Document Reviewed: 05/17/2015 Elsevier Interactive Patient Education  2017 [place] Prevention in the Home Falls can cause injuries. They can happen to people of all ages. There are many things you can do to make your home safe and to help prevent falls. What can I do on the outside of my home? Regularly fix the edges of walkways and driveways and fix any cracks. Remove anything that might make you trip as you walk through a door, such as a raised step or threshold. Trim any bushes or trees on the path to your home. Use bright outdoor lighting. Clear any walking paths of anything that might make someone trip, such as rocks or tools. Regularly check to see if handrails are loose or broken. Make sure that both sides of any steps have handrails. Any raised decks and porches should have guardrails on the edges. Have any leaves, snow, or ice cleared regularly. Use sand or salt on walking paths during winter. Clean up any spills in your garage right away. This includes oil or grease spills. What can I do in the bathroom? Use night lights. Install grab bars by the toilet and in the tub and shower. Do not use towel bars as grab bars. Use non-skid mats or decals in the tub or  shower. If you need to sit down in the shower, use a plastic, non-slip stool. Keep the floor dry. Clean up any water that spills on the floor as soon as it happens. Remove soap buildup in the tub or shower regularly. Attach bath mats securely with double-sided non-slip rug tape. Do not have throw rugs and other things on the floor that can make you trip. What can I do in the bedroom? Use night lights. Make sure that you have a light by your bed that is easy to reach. Do not use any sheets or blankets that are too big for your bed. They should not hang down onto the floor. Have a firm chair that has side arms. You can use this for support while you get dressed. Do not have throw rugs and other things on the floor that can make you trip. What can I do in the kitchen? Clean up any spills right away. Avoid walking on wet floors. Keep items that you use a lot in easy-to-reach places. If you need to reach something above you, use a strong step stool that has a grab bar. Keep electrical cords out of the way. Do not  use floor polish or wax that makes floors slippery. If you must use wax, use non-skid floor wax. Do not have throw rugs and other things on the floor that can make you trip. What can I do with my stairs? Do not leave any items on the stairs. Make sure that there are handrails on both sides of the stairs and use them. Fix handrails that are broken or loose. Make sure that handrails are as long as the stairways. Check any carpeting to make sure that it is firmly attached to the stairs. Fix any carpet that is loose or worn. Avoid having throw rugs at the top or bottom of the stairs. If you do have throw rugs, attach them to the floor with carpet tape. Make sure that you have a light switch at the top of the stairs and the bottom of the stairs. If you do not have them, ask someone to add them for you. What else can I do to help prevent falls? Wear shoes that: Do not have high heels. Have  rubber bottoms. Are comfortable and fit you well. Are closed at the toe. Do not wear sandals. If you use a stepladder: Make sure that it is fully opened. Do not climb a closed stepladder. Make sure that both sides of the stepladder are locked into place. Ask someone to hold it for you, if possible. Clearly mark and make sure that you can see: Any grab bars or handrails. First and last steps. Where the edge of each step is. Use tools that help you move around (mobility aids) if they are needed. These include: Canes. Walkers. Scooters. Crutches. Turn on the lights when you go into a dark area. Replace any light bulbs as soon as they burn out. Set up your furniture so you have a clear path. Avoid moving your furniture around. If any of your floors are uneven, fix them. If there are any pets around you, be aware of where they are. Review your medicines with your doctor. Some medicines can make you feel dizzy. This can increase your chance of falling. Ask your doctor what other things that you can do to help prevent falls. This information is not intended to replace advice given to you by your health care provider. Make sure you discuss any questions you have with your health care provider. Document Released: 05/12/2009 Document Revised: 12/22/2015 Document Reviewed: 08/20/2014 Elsevier Interactive Patient Education  2017 [employer].

## 2022-10-13 ENCOUNTER — Other Ambulatory Visit: Payer: Self-pay | Admitting: Family Medicine

## 2022-10-13 DIAGNOSIS — M1A9XX1 Chronic gout, unspecified, with tophus (tophi): Secondary | ICD-10-CM

## 2022-10-15 ENCOUNTER — Other Ambulatory Visit: Payer: Self-pay | Admitting: Family Medicine

## 2022-10-15 DIAGNOSIS — M1A9XX1 Chronic gout, unspecified, with tophus (tophi): Secondary | ICD-10-CM

## 2022-11-14 ENCOUNTER — Other Ambulatory Visit: Payer: Self-pay

## 2022-11-14 ENCOUNTER — Ambulatory Visit: Payer: Medicare Other | Admitting: Family Medicine

## 2022-11-14 VITALS — BP 168/70 | HR 88 | Ht 69.0 in | Wt 198.0 lb

## 2022-11-14 DIAGNOSIS — M7662 Achilles tendinitis, left leg: Secondary | ICD-10-CM | POA: Diagnosis not present

## 2022-11-14 NOTE — Progress Notes (Signed)
   Rubin Payor, PhD, LAT, ATC acting as a scribe for Clementeen Graham, MD.  Curtis [name] is a 72 y.o. male who presents to [facility] at [facility] today for left Achilles pain.  Patient was previously seen by Dr. 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  Today, patient c/o left Achilles pain ongoing for about 6 week. He recalls "twisted" his L ankle when he was walking the dog.  Patient locates pain to mid-Achilles tendon, more so on L-side of the tendon.   Swelling: yes- has improved Aggravates: walking Treatments tried: cream, Epsom salt soaks, ice  Pertinent review of systems: No fevers or chills  Relevant historical information: History of contralateral right Achilles tendon rupture Does take sildenafil.  Exam:  BP (!) 168/70   Pulse 88   Ht  (1.753 m)   Wt 198 lb (89.8 kg)   SpO2 98%   BMI 29.24 kg/m  General: Well Developed, well nourished, and in no acute distress.   MSK: Left Achilles tendon is large and swollen at distal Achilles tendon.  Mildly tender palpation in this region. No palpable defects are present. Normal foot and ankle motion.  Strength is intact.    Lab and Radiology Results  Diagnostic Limited MSK Ultrasound of: Left ankle Achilles tendon: Achilles tendon is normal at insertion onto calcaneus.  However proximal to the calcaneus and tendon is enlarged with a large Achilles tendon nodule measuring 1.1 cm (about 3 times normal size).  At the lateral aspect of the tendon deep to the tendon there is increased hypoechoic fluid and vascular activity consistent with peritendinitis. No visible tendon disruption is present on ultrasound. Impression: Left Achilles tendon nodule tendinitis and peritendinitis      Assessment and Plan: 72 y.o. male with left Achilles tendinitis.  Plan to treat with eccentric exercises.  If not improving rapidly consider nitroglycerin patches.  He does use sildenafil so we will have to be careful about use  of patch with this medication.  I am very talk to him about it and we can start the medicine before he comes back if needed. Also for now recommend night splints. Recheck in 6 weeks.  PDMP not reviewed this encounter. Orders Placed This Encounter  Procedures   [place] LIMITED JOINT SPACE STRUCTURES LOW LEFT(NO LINKED CHARGES)    Order Specific Question:   Reason for Exam (SYMPTOM  OR DIAGNOSIS REQUIRED)    Answer:   left achilles pain    Order Specific Question:   Preferred imaging location?    Answer:   [facility]   No orders of the defined types were placed in this encounter.    Discussed warning signs or symptoms. Please see discharge instructions. Patient expresses understanding.   The above documentation has been reviewed and is accurate and complete Clementeen Graham, M.D.

## 2022-11-14 NOTE — Patient Instructions (Addendum)
Thank you for coming in today.   Work on the home exercises.  Go from up to down slowly.  30 reps 3x daily  If not good enough we can add nitro patches.   Use a night splint for plantar fasciitis.   Recheck in 6 weeks.

## 2022-11-21 ENCOUNTER — Encounter: Payer: Self-pay | Admitting: Family Medicine

## 2022-11-21 NOTE — Telephone Encounter (Signed)
Dr. Denyse Amass, OK to send in [facility] for [facility] Protocol?   Per visit note 11/14/22:  Assessment and Plan: 72 y.o. male with left Achilles tendinitis.  Plan to treat with eccentric exercises.  If not improving rapidly consider nitroglycerin patches.  He does use sildenafil so we will have to be careful about use of patch with this medication.  I am very talk to him about it and we can start the medicine before he comes back if needed. Also for now recommend night splints. Recheck in 6 weeks.

## 2022-11-22 ENCOUNTER — Other Ambulatory Visit: Payer: Self-pay | Admitting: Family Medicine

## 2022-11-22 MED ORDER — NITROGLYCERIN 0.2 MG/HR TD PT24: 0.2000 mg | MEDICATED_PATCH | Freq: Every day | TRANSDERMAL | 1 refills | Status: DC

## 2022-11-28 ENCOUNTER — Ambulatory Visit: Payer: Medicare Other | Admitting: Family Medicine

## 2022-12-10 ENCOUNTER — Ambulatory Visit: Payer: Medicare Other | Admitting: Family Medicine

## 2022-12-10 ENCOUNTER — Encounter: Payer: Self-pay | Admitting: Family Medicine

## 2022-12-10 ENCOUNTER — Ambulatory Visit (INDEPENDENT_AMBULATORY_CARE_PROVIDER_SITE_OTHER): Payer: Medicare Other

## 2022-12-10 VITALS — BP 150/60 | HR 67 | Ht 69.0 in | Wt 201.4 lb

## 2022-12-10 DIAGNOSIS — M25552 Pain in left hip: Secondary | ICD-10-CM

## 2022-12-10 NOTE — Patient Instructions (Signed)
Thank you for coming in today.   Please get an Xray today before you leave   I've referred you to Physical Therapy.  Let us know if you don't hear from them in one week.   Check back in 6 weeks 

## 2022-12-10 NOTE — Progress Notes (Signed)
   I, Stevenson Clinch, CMA acting as a scribe for Clementeen Graham, MD.  Curtis [name] is a 72 y.o. male who presents to [facility] at [facility] today for L hip pain. Pt was previously seen by Dr. Denyse Amass on 11/14/22 for L Achilles tendinopathy.   Today, pt c/o left hip pain ongoing for weeks. Notes walking abnormally d/t achilles injury. He has a history of lumbar DDD and last had an ESI on 10/06/2021.  Patient locates pain to posterior aspect of the hip, radiating into the gluteal region and upper leg. Denies sharp shooting pain beyond the knee. Denies n/t, LE weakness. Continues with Nitro Patches with Achilles sx. Tried Tylenol with some relief. Sx worsen with first steps, improve with movement.   Radiates: glute/thigh Aggravates: first steps Treatments tried: Tylenol  Dx imaging: 10/02/2021 L-spine MRI  08/23/2021 L-spine x-ray  07/16/2018 L-spine XR  Pertinent review of systems: No fevers or chills  Relevant historical information: Achilles tendinitis.  Hypertension.  Gout.   Exam:  BP (!) 150/60   Pulse 67   Ht 5\' 9"  (1.753 m)   Wt 201 lb 6.4 oz (91.4 kg)   SpO2 96%   BMI 29.74 kg/m  General: Well Developed, well nourished, and in no acute distress.   MSK: Left hip: Normal-appearing Normal hip motion. Tender palpation at ischial tuberosity hamstring origin. Hip abduction and external rotation strength are mildly diminished 4/5.  Extension strength and knee flexion strength are mildly diminished 4/5 but this does reproduce pain.     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  No acute fractures are present.  Multiple metallic coils are present in the pelvis. Await formal radiology review    Assessment and Plan: 72 y.o. male with left posterior hip and buttocks pain.  Clinically his exam is most consistent with hamstring tendinitis or possibly a hip  rotator tendinitis.  He does have some degenerative changes and possible impingement seen on x-ray which could be a factor.  Plan for trial of physical therapy.  He has an established relationship with [facility] so we will use that location.  Recheck in 6 weeks.   PDMP not reviewed this encounter. Orders Placed This Encounter  Procedures   DG Hip Unilat W OR W/O Pelvis 2-3 Views Left    Standing Status:   Future    Number of Occurrences:   1    Standing Expiration Date:   12/10/2023    Order Specific Question:   Reason for Exam (SYMPTOM  OR DIAGNOSIS REQUIRED)    Answer:   eval post hip pain    Order Specific Question:   Preferred imaging location?    Answer:   [facility]   Ambulatory referral to Physical Therapy    Referral Priority:   Routine    Referral Type:   Physical Medicine    Referral Reason:   Specialty Services Required    Requested Specialty:   Physical Therapy    Number of Visits Requested:   1   No orders of the defined types were placed in this encounter.    Discussed warning signs or symptoms. Please see discharge instructions. Patient expresses understanding.   The above documentation has been reviewed and is accurate and complete Clementeen Graham, M.D.

## 2022-12-13 ENCOUNTER — Telehealth: Payer: Self-pay | Admitting: Family Medicine

## 2022-12-13 ENCOUNTER — Other Ambulatory Visit: Payer: Self-pay

## 2022-12-13 DIAGNOSIS — M7662 Achilles tendinitis, left leg: Secondary | ICD-10-CM

## 2022-12-13 NOTE — Telephone Encounter (Signed)
Pt sent MyChart message informing him referral has been placed.

## 2022-12-13 NOTE — Telephone Encounter (Signed)
Patient called stating that he has been having a lot of success with [facility]. He asked if Dr Denyse Amass could also refer him there for his left achilles tendon?  Please advise.

## 2022-12-17 NOTE — Progress Notes (Signed)
Left hip x-ray shows some medium arthritis changes.  Plan for trial of physical therapy.  If not better we have other things to try.

## 2022-12-26 ENCOUNTER — Ambulatory Visit: Payer: Medicare Other | Admitting: Family Medicine

## 2023-01-07 ENCOUNTER — Ambulatory Visit (INDEPENDENT_AMBULATORY_CARE_PROVIDER_SITE_OTHER): Payer: Medicare Other | Admitting: Dermatology

## 2023-01-07 VITALS — BP 141/57 | HR 68

## 2023-01-07 DIAGNOSIS — X32XXXA Exposure to sunlight, initial encounter: Secondary | ICD-10-CM | POA: Diagnosis not present

## 2023-01-07 DIAGNOSIS — L578 Other skin changes due to chronic exposure to nonionizing radiation: Secondary | ICD-10-CM

## 2023-01-07 DIAGNOSIS — Z1283 Encounter for screening for malignant neoplasm of skin: Secondary | ICD-10-CM

## 2023-01-07 DIAGNOSIS — D225 Melanocytic nevi of trunk: Secondary | ICD-10-CM

## 2023-01-07 DIAGNOSIS — Z86018 Personal history of other benign neoplasm: Secondary | ICD-10-CM

## 2023-01-07 DIAGNOSIS — L821 Other seborrheic keratosis: Secondary | ICD-10-CM

## 2023-01-07 DIAGNOSIS — D229 Melanocytic nevi, unspecified: Secondary | ICD-10-CM

## 2023-01-07 DIAGNOSIS — W908XXA Exposure to other nonionizing radiation, initial encounter: Secondary | ICD-10-CM

## 2023-01-07 DIAGNOSIS — D1801 Hemangioma of skin and subcutaneous tissue: Secondary | ICD-10-CM

## 2023-01-07 DIAGNOSIS — D485 Neoplasm of uncertain behavior of skin: Secondary | ICD-10-CM

## 2023-01-07 DIAGNOSIS — L814 Other melanin hyperpigmentation: Secondary | ICD-10-CM

## 2023-01-07 NOTE — Progress Notes (Signed)
   Follow-Up Visit   Subjective  Curtis [name] is a 72 y.o. male who presents for the following: Skin Cancer Screening and Full Body Skin Exam  The patient presents for Total-Body Skin Exam (TBSE) for skin cancer screening and mole check. The patient has spots, moles and lesions to be evaluated, some may be new or changing. He has one spot on his back/flank area that he keeps picking at thinking it's a tick.     The following portions of the chart were reviewed this encounter and updated as appropriate: medications, allergies, medical history  Review of Systems:  No other skin or systemic complaints except as noted in HPI or Assessment and Plan.  Objective  Well appearing patient in no apparent distress; mood and affect are within normal limits.  A full examination was performed including scalp, head, eyes, ears, nose, lips, neck, chest, axillae, abdomen, back, buttocks, bilateral upper extremities, bilateral lower extremities, hands, feet, fingers, toes, fingernails, and toenails. All findings within normal limits unless otherwise noted below.   Relevant physical exam findings are noted in the Assessment and Plan.  Left posterior axilla 3.0 mm flesh papule    Assessment & Plan   LENTIGINES, SEBORRHEIC KERATOSES, HEMANGIOMAS - Benign normal skin lesions - Benign-appearing - Call for any changes  MELANOCYTIC NEVI - Tan-brown and/or pink-flesh-colored symmetric macules and papules - Benign appearing on exam today - Observation - Call clinic for new or changing moles - Recommend daily use of broad spectrum spf 30+ sunscreen to sun-exposed areas.   ACTINIC DAMAGE - Chronic condition, secondary to cumulative UV/sun exposure - diffuse scaly erythematous macules with underlying dyspigmentation - Recommend daily broad spectrum sunscreen SPF 30+ to sun-exposed areas, reapply every 2 hours as needed.  - Staying in the shade or wearing long sleeves, sun glasses (UVA+UVB protection)  and wide brim hats (4-inch brim around the entire circumference of the hat) are also recommended for sun protection.  - Call for new or changing lesions.  SKIN CANCER SCREENING PERFORMED TODAY.  History of Dysplastic Nevi - No evidence of recurrence today of the left lower flank - Recommend regular full body skin exams - Recommend daily broad spectrum sunscreen SPF 30+ to sun-exposed areas, reapply every 2 hours as needed.  - Call if any new or changing lesions are noted between office visits  Neoplasm of uncertain behavior of skin Left posterior axilla  Epidermal / dermal shaving  Lesion diameter (cm):  0.3 Informed consent: discussed and consent obtained   Patient was prepped and draped in usual sterile fashion: Area prepped with alcohol. Anesthesia: the lesion was anesthetized in a standard fashion   Anesthetic:  1% lidocaine w/ epinephrine 1-100,000 buffered w/ 8.4% NaHCO3 Instrument used: flexible razor blade   Hemostasis achieved with: pressure, aluminum chloride and electrodesiccation   Outcome: patient tolerated procedure well   Post-procedure details: wound care instructions given   Post-procedure details comment:  Ointment and small bandage applied  Specimen 1 - Surgical pathology Differential Diagnosis: Irritated Nevus vs other Check Margins: No   Return in about 1 year (around 01/07/2024) for TBSE, Hx Dysplastic Nevus.  ICherlyn Labella, CMA, am acting as scribe for Willeen Niece, MD .   Documentation: I have reviewed the above documentation for accuracy and completeness, and I agree with the above.  Willeen Niece, MD

## 2023-01-07 NOTE — Patient Instructions (Addendum)
Wound Care Instructions  Cleanse wound gently with soap and water once a day then pat dry with clean gauze. Apply a thin coat of Petrolatum (petroleum jelly, "Vaseline") over the wound (unless you have an allergy to this). We recommend that you use a new, sterile tube of Vaseline. Do not pick or remove scabs. Do not remove the yellow or white "healing tissue" from the base of the wound.  Cover the wound with fresh, clean, nonstick gauze and secure with paper tape. You may use Band-Aids in place of gauze and tape if the wound is small enough, but would recommend trimming much of the tape off as there is often too much. Sometimes Band-Aids can irritate the skin.  You should call the office for your biopsy report after 1 week if you have not already been contacted.  If you experience any problems, such as abnormal amounts of bleeding, swelling, significant bruising, significant pain, or evidence of infection, please call the office immediately.  FOR ADULT SURGERY PATIENTS: If you need something for pain relief you may take 1 extra strength Tylenol (acetaminophen) AND 2 Ibuprofen (200mg each) together every 4 hours as needed for pain. (do not take these if you are allergic to them or if you have a reason you should not take them.) Typically, you may only need pain medication for 1 to 3 days.     Due to recent changes in healthcare laws, you may see results of your pathology and/or laboratory studies on MyChart before the doctors have had a chance to review them. We understand that in some cases there may be results that are confusing or concerning to you. Please understand that not all results are received at the same time and often the doctors may need to interpret multiple results in order to provide you with the best plan of care or course of treatment. Therefore, we ask that you please give us 2 business days to thoroughly review all your results before contacting the office for clarification. Should  we see a critical lab result, you will be contacted sooner.   If You Need Anything After Your Visit  If you have any questions or concerns for your doctor, please call our main line at [phone] and press option 4 to reach your doctor's medical assistant. If no one answers, please leave a voicemail as directed and we will return your call as soon as possible. Messages left after 4 pm will be answered the following business day.   You may also send us a message via MyChart. We typically respond to MyChart messages within 1-2 business days.  For prescription refills, please ask your pharmacy to contact our office. Our fax number is [phone].  If you have an urgent issue when the clinic is closed that cannot wait until the next business day, you can page your doctor at the number below.    Please note that while we do our best to be available for urgent issues outside of office hours, we are not available 24/7.   If you have an urgent issue and are unable to reach us, you may choose to seek medical care at your doctor's office, retail clinic, urgent care center, or emergency room.  If you have a medical emergency, please immediately call 911 or go to the emergency department.  Pager Numbers  - Dr. Kowalski: [phone]  - Dr. Moye: [phone]  - Dr. Stewart: [phone]  In the event of inclement weather, please call our main line at   [phone] for an update on the status of any delays or closures.  Dermatology Medication Tips: Please keep the boxes that topical medications come in in order to help keep track of the instructions about where and how to use these. Pharmacies typically print the medication instructions only on the boxes and not directly on the medication tubes.   If your medication is too expensive, please contact our office at [phone] option 4 or send us a message through MyChart.   We are unable to tell what your co-pay for medications will be in  advance as this is different depending on your insurance coverage. However, we may be able to find a substitute medication at lower cost or fill out paperwork to get insurance to cover a needed medication.   If a prior authorization is required to get your medication covered by your insurance company, please allow us 1-2 business days to complete this process.  Drug prices often vary depending on where the prescription is filled and some pharmacies may offer cheaper prices.  The website www.goodrx.com contains coupons for medications through different pharmacies. The prices here do not account for what the cost may be with help from insurance (it may be cheaper with your insurance), but the website can give you the price if you did not use any insurance.  - You can print the associated coupon and take it with your prescription to the pharmacy.  - You may also stop by our office during regular business hours and pick up a GoodRx coupon card.  - If you need your prescription sent electronically to a different pharmacy, notify our office through [place] MyChart or by phone at [phone] option 4.     Si Usted Necesita Algo Despus de Su Visita  Tambin puede enviarnos un mensaje a travs de MyChart. Por lo general respondemos a los mensajes de MyChart en el transcurso de 1 a 2 das hbiles.  Para renovar recetas, por favor pida a su farmacia que se ponga en contacto con nuestra oficina. Nuestro nmero de fax es el [phone].  Si tiene un asunto urgente cuando la clnica est cerrada y que no puede esperar hasta el siguiente da hbil, puede llamar/localizar a su doctor(a) al nmero que aparece a continuacin.   Por favor, tenga en cuenta que aunque hacemos todo lo posible para estar disponibles para asuntos urgentes fuera del horario de oficina, no estamos disponibles las 24 horas del da, los 7 das de la semana.   Si tiene un problema urgente y no puede comunicarse con nosotros, puede  optar por buscar atencin mdica  en el consultorio de su doctor(a), en una clnica privada, en un centro de atencin urgente o en una sala de emergencias.  Si tiene una emergencia mdica, por favor llame inmediatamente al 911 o vaya a la sala de emergencias.  Nmeros de bper  - Dr. Kowalski: [phone]  - Dra. Moye: [phone]  - Dra. Stewart: [phone]  En caso de inclemencias del tiempo, por favor llame a nuestra lnea principal al [phone] para una actualizacin sobre el estado de cualquier retraso o cierre.  Consejos para la medicacin en dermatologa: Por favor, guarde las cajas en las que vienen los medicamentos de uso tpico para ayudarle a seguir las instrucciones sobre dnde y cmo usarlos. Las farmacias generalmente imprimen las instrucciones del medicamento slo en las cajas y no directamente en los tubos del medicamento.   Si su medicamento es muy caro, por favor, pngase en contacto con   nuestra oficina llamando al [phone] y presione la opcin 4 o envenos un mensaje a travs de MyChart.   No podemos decirle cul ser su copago por los medicamentos por adelantado ya que esto es diferente dependiendo de la cobertura de su seguro. Sin embargo, es posible que podamos encontrar un medicamento sustituto a menor costo o llenar un formulario para que el seguro cubra el medicamento que se considera necesario.   Si se requiere una autorizacin previa para que su compaa de seguros cubra su medicamento, por favor permtanos de 1 a 2 das hbiles para completar este proceso.  Los precios de los medicamentos varan con frecuencia dependiendo del lugar de dnde se surte la receta y alguna farmacias pueden ofrecer precios ms baratos.  El sitio web www.goodrx.com tiene cupones para medicamentos de diferentes farmacias. Los precios aqu no tienen en cuenta lo que podra costar con la ayuda del seguro (puede ser ms barato con su seguro), pero el sitio web puede darle el  precio si no utiliz ningn seguro.  - Puede imprimir el cupn correspondiente y llevarlo con su receta a la farmacia.  - Tambin puede pasar por nuestra oficina durante el horario de atencin regular y recoger una tarjeta de cupones de GoodRx.  - Si necesita que su receta se enve electrnicamente a una farmacia diferente, informe a nuestra oficina a travs de MyChart de [place] o por telfono llamando al [phone] y presione la opcin 4.  

## 2023-01-11 ENCOUNTER — Other Ambulatory Visit: Payer: Self-pay | Admitting: Family Medicine

## 2023-01-11 DIAGNOSIS — M1A9XX1 Chronic gout, unspecified, with tophus (tophi): Secondary | ICD-10-CM

## 2023-01-14 ENCOUNTER — Telehealth: Payer: Self-pay

## 2023-01-14 NOTE — Telephone Encounter (Signed)
Requested medication (s) are due for refill today:   Yes for both  Requested medication (s) are on the active medication list:   Yes for both  Future visit scheduled:   No   Last ordered: Both 12/07/2021 #90, 3 refills  Returned because a lipid panel is due per protocol.     Requested Prescriptions  Pending Prescriptions Disp Refills   pravastatin (PRAVACHOL) 40 MG tablet [Pharmacy Med Name: PRAVASTATIN SODIUM 40 MG TAB] 90 tablet 3    Sig: TAKE 1 TABLET BY MOUTH DAILY.     Cardiovascular:  Antilipid - Statins Failed - 01/11/2023  4:58 PM      Failed - Lipid Panel in normal range within the last 12 months    Cholesterol, Total  Date Value Ref Range Status  12/08/2021 151 100 - 199 mg/dL Final   LDL Chol Calc (NIH)  Date Value Ref Range Status  12/08/2021 84 0 - 99 mg/dL Final   HDL  Date Value Ref Range Status  12/08/2021 37 (L) >39 mg/dL Final   Triglycerides  Date Value Ref Range Status  12/08/2021 176 (H) 0 - 149 mg/dL Final         Passed - Patient is not pregnant      Passed - Valid encounter within last 12 months    Recent Outpatient Visits           7 months ago Essential hypertension   [place] [facility] [place], Marzella Schlein, MD   1 year ago Encounter for annual health examination   [facility] [place], Marzella Schlein, MD   1 year ago Essential hypertension   [place] [facility] [place], Marzella Schlein, MD   1 year ago Essential hypertension   [facility] [place], Marzella Schlein, MD   1 year ago Essential hypertension   [place] [facility] [place], Marzella Schlein, MD       Future Appointments             In 1 week Rodolph Bong, MD [facility] at [facility]             amLODipine (NORVASC) 10 MG tablet [Pharmacy Med Name: AMLODIPINE BESYLATE 10 MG TAB] 90 tablet 3    Sig: TAKE 1 TABLET BY MOUTH DAILY.      Cardiovascular: Calcium Channel Blockers 2 Failed - 01/11/2023  4:58 PM      Failed - Last BP in normal range    BP Readings from Last 1 Encounters:  01/07/23 (!) 141/57         Passed - Last Heart Rate in normal range    Pulse Readings from Last 1 Encounters:  01/07/23 68         Passed - Valid encounter within last 6 months    Recent Outpatient Visits           7 months ago Essential hypertension   [place] [facility] [place], Marzella Schlein, MD   1 year ago Encounter for annual health examination   [facility] [place], Marzella Schlein, MD   1 year ago Essential hypertension   [place] [facility] [place], Marzella Schlein, MD   1 year ago Essential hypertension   [place] [facility] [place], Marzella Schlein, MD   1 year ago Essential hypertension   [place] [facility] [place], Marzella Schlein, MD  Future Appointments             In 1 week Rodolph Bong, MD [place] [facility] at [facility]

## 2023-01-14 NOTE — Telephone Encounter (Signed)
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

## 2023-01-14 NOTE — Telephone Encounter (Signed)
Requested medications are due for refill today.  yes  Requested medications are on the active medications list.  yes  Last refill. 10/15/2022 #90 0 rf  Future visit scheduled.   In 2025  Notes to clinic.  Labs are expired.    Requested Prescriptions  Pending Prescriptions Disp Refills   allopurinol (ZYLOPRIM) 300 MG tablet [Pharmacy Med Name: ALLOPURINOL 300 MG TAB] 90 tablet 0    Sig: TAKE 1 TABLET BY MOUTH DAILY     Endocrinology:  Gout Agents - allopurinol Failed - 01/11/2023  4:57 PM      Failed - CBC within normal limits and completed in the last 12 months    WBC  Date Value Ref Range Status  11/24/2020 8.0 4.0 - 10.5 K/uL Final   RBC  Date Value Ref Range Status  11/24/2020 4.98 4.22 - 5.81 Mil/uL Final   Hemoglobin  Date Value Ref Range Status  09/10/2022 14.6 13.0 - 17.7 g/dL Final   Hematocrit  Date Value Ref Range Status  09/10/2022 43.1 37.5 - 51.0 % Final   MCHC  Date Value Ref Range Status  11/24/2020 34.1 30.0 - 36.0 g/dL Final   [facility]  Date Value Ref Range Status  01/28/2020 28.9 26.6 - 33.0 pg Final   MCV  Date Value Ref Range Status  11/24/2020 87.6 78.0 - 100.0 fl Final  01/28/2020 87 79 - 97 fL Final   No results found for: "PLTCOUNTKUC", "LABPLAT", "POCPLA" RDW  Date Value Ref Range Status  11/24/2020 14.1 11.5 - 15.5 % Final  01/28/2020 12.7 11.6 - 15.4 % Final         Passed - Uric Acid in normal range and within 360 days    Uric Acid  Date Value Ref Range Status  06/11/2022 4.5 3.8 - 8.4 mg/dL Final    Comment:               Therapeutic target for gout patients: <6.0         Passed - Cr in normal range and within 360 days    Creatinine, Ser  Date Value Ref Range Status  06/11/2022 1.02 0.76 - 1.27 mg/dL Final         Passed - Valid encounter within last 12 months    Recent Outpatient Visits           7 months ago Essential hypertension   [facility] [place], Marzella Schlein, MD   1 year ago  Encounter for annual health examination   [facility] [place], Marzella Schlein, MD   1 year ago Essential hypertension   [place] [facility] [place], Marzella Schlein, MD   1 year ago Essential hypertension   [place] [facility] [place], Marzella Schlein, MD   1 year ago Essential hypertension   [place] [facility] [place], Marzella Schlein, MD       Future Appointments             In 1 week Rodolph Bong, MD [facility] at [facility]

## 2023-01-14 NOTE — Telephone Encounter (Signed)
-----   Message from Willeen Niece, MD sent at 01/14/2023  1:36 PM EDT ----- Skin , left posterior axilla MELANOCYTIC NEVUS, INTRADERMAL TYPE, IRRITATED  Benign irritated mole - please call patient

## 2023-01-21 ENCOUNTER — Encounter: Payer: Self-pay | Admitting: Family Medicine

## 2023-01-21 ENCOUNTER — Other Ambulatory Visit: Payer: Self-pay

## 2023-01-21 ENCOUNTER — Ambulatory Visit (INDEPENDENT_AMBULATORY_CARE_PROVIDER_SITE_OTHER): Payer: Medicare Other

## 2023-01-21 ENCOUNTER — Ambulatory Visit: Payer: Medicare Other | Admitting: Family Medicine

## 2023-01-21 VITALS — BP 162/70 | HR 68 | Ht 69.0 in | Wt 201.0 lb

## 2023-01-21 DIAGNOSIS — M25552 Pain in left hip: Secondary | ICD-10-CM

## 2023-01-21 DIAGNOSIS — M5416 Radiculopathy, lumbar region: Secondary | ICD-10-CM

## 2023-01-21 NOTE — Progress Notes (Signed)
Rubin Payor, PhD, LAT, ATC acting as a scribe for Clementeen Graham, MD..  Curtis [name] is a 72 y.o. male who presents to [facility] at [facility] today for L buttock pain. Pt was last seen by Dr. Denyse Amass on 12/10/22 for his L posterior hip and he was referred to [facility] PT. Pt called the office on May 16th c/o L Achilles pain and requesting this be added to the PT order.  Today, pt reports L buttock pain x a few weeks. He relates the change in pain location due to compensation. He notes his L heel is feeling great. Pt locates pain to L posterior hip/buttock. Worse 1st thing in the mornings. He has d/c PT, but will sometimes work on [employer]. Pain is located in the posterior buttocks and radiates down to the posterior thigh.  Pain is worse with activity and better with rest.  Pain was really bad last week but is improved a bit this week.  Low back pain: yes Radiating pain: no LE numbness/tingling: yes- L foot LE weakness: yes Aggravates: varies Treatments tried: stretching, massage, acupuncture   Dx imaging: 10/02/2021 L-spine MRI             08/23/2021 L-spine x-ray             07/16/2018 L-spine XR  Pertinent review of systems: No fevers or chills  Relevant historical information: Hypertension   Exam:  BP (!) 162/70   Pulse 68   Ht 5\' 9"  (1.753 m)   Wt 201 lb (91.2 kg)   SpO2 96%   BMI 29.68 kg/m  General: Well Developed, well nourished, and in no acute distress.   MSK: L-spine: Normal appearing Nontender palpation spinal midline. Normal lumbar motion. Lower extremity strength is intact.  Left hip: Normal appearing. Tender palpation ischial tuberosity.  Normal hip motion.  Strength is generally intact. Unable to do reproduce pain with resisted hip extension or resisted knee flexion.    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  No acute fractures  are visible. Await formal radiology review    Assessment and Plan: 72 y.o. male with chronic left buttocks pain and pain radiating down the posterior thigh.  Etiology is unclear.  I think it is likely he will have multiple problems when further evaluated with MRI.  Suspect hamstring tendinitis and possibly piriformis syndrome.  There may be a lumbar radicular component as well.  He has had a good trial of physical therapy over the last 6 weeks and continued home exercise program with little benefit.  His symptoms wax and wane and are severe at times.  Plan for MRI lumbar spine and hip and recheck after MRI very likely.  If obvious epidural steroid injection target is visible we can try that before he comes back.  Otherwise return to clinic after MRI.   PDMP not reviewed this encounter. Orders Placed This Encounter  Procedures   DG Lumbar Spine 2-3 Views    Standing Status:   Future    Number of Occurrences:   1    Standing Expiration Date:   01/21/2024    Order Specific Question:   Reason for Exam (SYMPTOM  OR DIAGNOSIS REQUIRED)    Answer:   eval left leg pain    Order Specific Question:   Preferred imaging location?    Answer:   [facility]   MR HIP  LEFT WO CONTRAST    Standing Status:   Future    Standing Expiration Date:   02/20/2023    Order Specific Question:   What is the patient's sedation requirement?    Answer:   No Sedation    Order Specific Question:   Does the patient have a pacemaker or implanted devices?    Answer:   No    Order Specific Question:   Preferred imaging location?    Answer:   [facility] (table limit-550lbs)   MR LUMBAR SPINE WO CONTRAST    Standing Status:   Future    Standing Expiration Date:   02/20/2023    Order Specific Question:   What is the patient's sedation requirement?    Answer:   No Sedation    Order Specific Question:   Does the patient have a pacemaker or implanted devices?    Answer:   No    Order Specific Question:    Preferred imaging location?    Answer:   [facility] (table limit-550lbs)   No orders of the defined types were placed in this encounter.    Discussed warning signs or symptoms. Please see discharge instructions. Patient expresses understanding.   The above documentation has been reviewed and is accurate and complete Clementeen Graham, M.D.

## 2023-01-21 NOTE — Patient Instructions (Addendum)
Thank you for coming in today.   Please get an Xray today before you leave   You should hear from MRI scheduling within 1 week. If you do not hear please let me know.    Recheck once we get the results back.

## 2023-01-28 NOTE — Progress Notes (Signed)
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

## 2023-02-10 ENCOUNTER — Ambulatory Visit
Admission: RE | Admit: 2023-02-10 | Discharge: 2023-02-10 | Disposition: A | Discharge status: Home / Self Care | Payer: Medicare Other | Source: Ambulatory Visit | Attending: Family Medicine | Admitting: Family Medicine

## 2023-02-10 DIAGNOSIS — M25552 Pain in left hip: Secondary | ICD-10-CM

## 2023-02-10 DIAGNOSIS — M5416 Radiculopathy, lumbar region: Secondary | ICD-10-CM

## 2023-02-17 ENCOUNTER — Encounter: Payer: Self-pay | Admitting: Family Medicine

## 2023-02-18 NOTE — Progress Notes (Signed)
Lumbar spine MRI shows area over the nerves could be pinched causing pain to go down your leg.  Recommend return to clinic to go over the results in full detail and discuss potential treatment options including injection in your back.

## 2023-02-18 NOTE — Progress Notes (Signed)
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  There is some tendinitis in the front of the hip.  Not much wrong in the tendons in the back of the hip.  Recommend return to clinic to go over the results in full detail and discussed treatment plan and options.

## 2023-02-20 ENCOUNTER — Ambulatory Visit: Payer: Medicare Other | Admitting: Family Medicine

## 2023-02-20 ENCOUNTER — Encounter: Payer: Self-pay | Admitting: Family Medicine

## 2023-02-20 VITALS — BP 162/58 | HR 76 | Ht 69.0 in | Wt 203.0 lb

## 2023-02-20 DIAGNOSIS — M47816 Spondylosis without myelopathy or radiculopathy, lumbar region: Secondary | ICD-10-CM

## 2023-02-20 DIAGNOSIS — G8929 Other chronic pain: Secondary | ICD-10-CM

## 2023-02-20 DIAGNOSIS — M545 Low back pain, unspecified: Secondary | ICD-10-CM | POA: Diagnosis not present

## 2023-02-20 NOTE — Progress Notes (Unsigned)
I, Stevenson Clinch, CMA acting as a scribe for Clementeen Graham, MD.  Curtis [name] is a 72 y.o. male who presents to [facility] today for f/u L buttock pain. Pt was last seen by Dr. Denyse Amass on 01/21/23 and MRI's were ordered.  Today, pt reports minimal change in sx since last visit. Notes improvement of L LE sx since last visit. Continues to have lower back and left hip pain. Review MRI L-spine and L hip today.  . The pain he was radiating down his left thigh has resolved.  Dx imaging: 02/10/23 L-spine & L hip MRI  01/21/23 L-spine XR 12/10/22 L hip XR 10/02/2021 L-spine MRI             08/23/2021 L-spine x-ray             07/16/2018 L-spine XR  Pertinent review of systems: No fevers or chills  Relevant historical information: Hypertension   Exam:  BP (!) 162/58   Pulse 76   Ht 5\' 9"  (1.753 m)   Wt 203 lb (92.1 kg)   SpO2 95%   BMI 29.98 kg/m  General: Well Developed, well nourished, and in no acute distress.   MSK: EXAM: MRI LUMBAR SPINE WITHOUT CONTRAST   TECHNIQUE: Multiplanar, multisequence MR imaging of the lumbar spine was performed. No intravenous contrast was administered.   COMPARISON:  MRI October 02, 2021.   FINDINGS: Segmentation:  Standard.   Alignment:  Mild grade 1 anterolisthesis of L4 on L5, similar.   Vertebrae: No specific evidence of acute fracture, discitis/osteomyelitis, or suspicious bone lesion. Degenerative Schmorl's node involving the inferior L3 endplate.   Conus medullaris and cauda equina: Conus extends to the L1-L2 level. Conus appears normal.   Paraspinal and other soft tissues: Unremarkable.   Disc levels:   T12-L1: No significant disc protrusion, foraminal stenosis, or canal stenosis.   L1-L2: No significant disc protrusion, foraminal stenosis, or canal stenosis.   L2-L3: Mild disc bulge.  No significant stenosis.   L3-L4: Disc bulge and endplate spurring. Mild bilateral foraminal stenosis and  mild subarticular recess narrowing, similar. No significant central canal stenosis.   L4-L5: Grade 1 anterolisthesis. Uncovering the disc. Ligamentum flavum and thickening facet arthropathy. Approximately 3-4 mm probable synovial cyst along the right facet joint anteromedially. Resulting progressive mild to moderate bilateral subarticular recess stenosis with mild central canal stenosis. Mild bilateral foraminal stenosis.   L5-S1: No significant disc protrusion, foraminal stenosis, or canal stenosis.   IMPRESSION: 1. At L4-L5, mildly progressive mild-to-moderate bilateral subarticular recess stenosis with mild central canal stenosis, detailed above. Progressive bilateral facet arthropathy with grade 1 anterolisthesis and similar mild bilateral foraminal stenosis. 2. At L3-L4, similar mild bilateral foraminal stenosis and subarticular recess narrowing.     Electronically Signed   By: Feliberto Harts M.D.   On: 02/18/2023 08:59  EXAM: MR OF THE LEFT HIP WITHOUT CONTRAST   TECHNIQUE: Multiplanar, multisequence MR imaging was performed. No intravenous contrast was administered.   COMPARISON:  Radiographs 12/10/2022.   FINDINGS: Bones: There is no evidence of acute fracture, dislocation or femoral head osteonecrosis. The visualized bony pelvis appears normal. Mild sacroiliac degenerative changes bilaterally. Lumbar spine findings are dictated separately.   Articular cartilage and labrum   Articular cartilage: Mild degenerative changes at both hips without significant subchondral edema or cyst formation.   Labrum: There is no gross labral tear or paralabral abnormality.   Joint or bursal effusion   Joint effusion: No significant  hip joint effusion.   Bursae: Small amount of asymmetric fluid within the left iliopsoas bursa. No other periarticular fluid collections are identified.   Muscles and tendons   Muscles and tendons: The visualized gluteus, hamstring and  iliopsoas tendons appear normal. As above, there is a small amount of asymmetric fluid within the left iliopsoas bursa. Small incidental lipoma noted in the right rectus femoris muscle. No focal muscular atrophy or edema. The piriformis muscles appear unremarkable.   Other findings   Miscellaneous: Central enlargement of the prostate gland most consistent with BPH. The visualized internal pelvic contents are otherwise unremarkable.   IMPRESSION: 1. Mild degenerative changes at both hips and sacroiliac joints. No acute osseous findings. 2. Small amount of asymmetric fluid within the left iliopsoas bursa. 3. The hip muscles and tendons appear normal. 4. Lumbar spine findings dictated separately.     Electronically Signed   By: Carey Bullocks M.D.   On: 02/18/2023 08:58  I, Clementeen Graham, personally (independently) visualized and performed the interpretation of the images attached in this note.     Lab and Radiology Results No results found for this or any previous visit (from the past 72 hour(s)). No results found.     Assessment and Plan: 72 y.o. male with chronic low back pain bilaterally.  Based on location of pain the facet joints are the likely candidate for his pain generator.  Will attempt bilateral facet injections at L4-5.  If this does not work well the SI joints could be a source of pain as well and we can proceed with injection of those.  Will keep me updated how he feels with the injections.  We did talk about medial branch block and ablation as a potential step as well.   PDMP not reviewed this encounter. Orders Placed This Encounter  Procedures   DG FACET JT INJ L /S SINGLE LEVEL LEFT W/FL/CT    Standing Status:   Future    Standing Expiration Date:   03/23/2023    Order Specific Question:   Reason for Exam (SYMPTOM  OR DIAGNOSIS REQUIRED)    Answer:   lumbar radiculopathy    Order Specific Question:   Preferred Imaging Location?    Answer:   [facility]   DG FACET JT INJ L /S SINGLE LEVEL RIGHT W/FL/CT    Standing Status:   Future    Standing Expiration Date:   03/23/2023    Order Specific Question:   Reason for Exam (SYMPTOM  OR DIAGNOSIS REQUIRED)    Answer:   low back pain    Order Specific Question:   Preferred Imaging Location?    Answer:   [facility]   No orders of the defined types were placed in this encounter.    Discussed warning signs or symptoms. Please see discharge instructions. Patient expresses understanding.   The above documentation has been reviewed and is accurate and complete Clementeen Graham, M.D. Total encounter time 20 minutes including face-to-face time with the patient and, reviewing past medical record, and charting on the date of service.

## 2023-03-12 ENCOUNTER — Other Ambulatory Visit: Payer: Medicare Other

## 2023-03-12 DIAGNOSIS — N529 Male erectile dysfunction, unspecified: Secondary | ICD-10-CM

## 2023-03-12 DIAGNOSIS — N138 Other obstructive and reflux uropathy: Secondary | ICD-10-CM

## 2023-03-12 DIAGNOSIS — E349 Endocrine disorder, unspecified: Secondary | ICD-10-CM

## 2023-03-15 ENCOUNTER — Other Ambulatory Visit: Payer: Self-pay

## 2023-03-15 DIAGNOSIS — E349 Endocrine disorder, unspecified: Secondary | ICD-10-CM

## 2023-03-15 DIAGNOSIS — E291 Testicular hypofunction: Secondary | ICD-10-CM

## 2023-04-10 ENCOUNTER — Other Ambulatory Visit: Payer: Self-pay | Admitting: Family Medicine

## 2023-04-10 DIAGNOSIS — M1A9XX1 Chronic gout, unspecified, with tophus (tophi): Secondary | ICD-10-CM

## 2023-07-10 ENCOUNTER — Other Ambulatory Visit: Payer: Self-pay | Admitting: Family Medicine

## 2023-07-10 DIAGNOSIS — M1A9XX1 Chronic gout, unspecified, with tophus (tophi): Secondary | ICD-10-CM

## 2023-07-11 NOTE — Telephone Encounter (Signed)
Requested medications are due for refill today.  yes  Requested medications are on the active medications list.  yes  Last refill. 04/11/2023 #90 0 rf  Future visit scheduled.  no  Notes to clinic.  Expired labs.     Requested Prescriptions  Pending Prescriptions Disp Refills   allopurinol (ZYLOPRIM) 300 MG tablet [Pharmacy Med Name: ALLOPURINOL 300 MG TAB] 90 tablet 0    Sig: TAKE 1 TABLET BY MOUTH DAILY     Endocrinology:  Gout Agents - allopurinol Failed - 07/11/2023  8:04 AM      Failed - Uric Acid in normal range and within 360 days    Uric Acid  Date Value Ref Range Status  06/11/2022 4.5 3.8 - 8.4 mg/dL Final    Comment:               Therapeutic target for gout patients: <6.0         Failed - Cr in normal range and within 360 days    Creatinine, Ser  Date Value Ref Range Status  06/11/2022 1.02 0.76 - 1.27 mg/dL Final         Failed - CBC within normal limits and completed in the last 12 months    WBC  Date Value Ref Range Status  11/24/2020 8.0 4.0 - 10.5 K/uL Final   RBC  Date Value Ref Range Status  11/24/2020 4.98 4.22 - 5.81 Mil/uL Final   Hemoglobin  Date Value Ref Range Status  03/12/2023 14.1 13.0 - 17.7 g/dL Final   Hematocrit  Date Value Ref Range Status  03/12/2023 41.5 37.5 - 51.0 % Final   MCHC  Date Value Ref Range Status  11/24/2020 34.1 30.0 - 36.0 g/dL Final   [facility]  Date Value Ref Range Status  01/28/2020 28.9 26.6 - 33.0 pg Final   MCV  Date Value Ref Range Status  11/24/2020 87.6 78.0 - 100.0 fl Final  01/28/2020 87 79 - 97 fL Final   No results found for: "PLTCOUNTKUC", "LABPLAT", "POCPLA" RDW  Date Value Ref Range Status  11/24/2020 14.1 11.5 - 15.5 % Final  01/28/2020 12.7 11.6 - 15.4 % Final         Passed - Valid encounter within last 12 months    Recent Outpatient Visits           1 year ago Essential hypertension   [place] [facility] [place], Marzella Schlein, MD   1 year ago Encounter for  annual health examination   [facility], Marzella Schlein, MD   1 year ago Essential hypertension   [facility] [place], Marzella Schlein, MD   1 year ago Essential hypertension   [place] [facility] [employer] [facility], Marzella Schlein, MD   1 year ago Essential hypertension   [place] [facility] [place], Marzella Schlein, MD       Future Appointments             In 2 months McGowan, Elana Alm [facility] [place]

## 2023-08-22 ENCOUNTER — Other Ambulatory Visit: Payer: Self-pay | Admitting: Family Medicine

## 2023-09-12 ENCOUNTER — Other Ambulatory Visit: Payer: Medicare Other

## 2023-09-12 ENCOUNTER — Other Ambulatory Visit: Payer: Self-pay

## 2023-09-12 ENCOUNTER — Other Ambulatory Visit: Payer: Self-pay | Admitting: Family Medicine

## 2023-09-12 DIAGNOSIS — N401 Enlarged prostate with lower urinary tract symptoms: Secondary | ICD-10-CM

## 2023-09-12 DIAGNOSIS — N138 Other obstructive and reflux uropathy: Secondary | ICD-10-CM

## 2023-09-12 DIAGNOSIS — E291 Testicular hypofunction: Secondary | ICD-10-CM

## 2023-09-13 LAB — TESTOSTERONE: Testosterone: 439 ng/dL (ref 264–916)

## 2023-09-13 LAB — PSA: Prostate Specific Ag, Serum: 2.8 ng/mL (ref 0.0–4.0)

## 2023-09-13 LAB — HEMOGLOBIN AND HEMATOCRIT, BLOOD
Hematocrit: 44 % (ref 37.5–51.0)
Hemoglobin: 14.7 g/dL (ref 13.0–17.7)

## 2023-09-13 NOTE — Telephone Encounter (Signed)
Requested medications are due for refill today.  yes  Requested medications are on the active medications list.  yes  Last refill. 08/30/2022 #90 3 rf  Future visit scheduled.   no  Notes to clinic.  Pt is more than 3 months overdue for an ov.    Requested Prescriptions  Pending Prescriptions Disp Refills   lisinopril (ZESTRIL) 20 MG tablet [Pharmacy Med Name: LISINOPRIL 20 MG TAB] 90 tablet 3    Sig: TAKE 1 TABLET BY MOUTH DAILY.     Cardiovascular:  ACE Inhibitors Failed - 09/13/2023 11:40 AM      Failed - Cr in normal range and within 180 days    Creatinine, Ser  Date Value Ref Range Status  06/11/2022 1.02 0.76 - 1.27 mg/dL Final         Failed - K in normal range and within 180 days    Potassium  Date Value Ref Range Status  06/11/2022 4.6 3.5 - 5.2 mmol/L Final         Failed - Last BP in normal range    BP Readings from Last 1 Encounters:  02/20/23 (!) 162/58         Failed - Valid encounter within last 6 months    Recent Outpatient Visits           1 year ago Essential hypertension   [facility], Marzella Schlein, MD   1 year ago Encounter for annual health examination   [facility] [place], Marzella Schlein, MD   1 year ago Essential hypertension   [place] [facility] [place], Marzella Schlein, MD   1 year ago Essential hypertension   [place] [place], Marzella Schlein, MD   1 year ago Essential hypertension   [facility] [place], Marzella Schlein, MD       Future Appointments             In 6 days McGowan, Elana Alm [facility] [place]   In 4 months Willeen Niece, MD [facility]            Passed - Patient is not pregnant

## 2023-09-17 NOTE — Progress Notes (Unsigned)
09/19/2023 9:35 PM   Curtis [name] 26-Apr-1951 [id number]  Referring provider: Erasmo Downer, MD [address] [place],  [place] [address]  Urological history: 1. Testosterone deficiency -contributing factors of age and obesity -Testosterone level (08/2023) 439 -Hemoglobin/hematocrit (08/2023) 14.7/44.0 -testosterone cypionate 200 mg/cc, 0.5 cc every 14 days   2. BPH with LU TS -PSA (08/2023) 2.8  3. ED -contributing factors of age, BPH, testosterone deficiency, HTN, osteoarthritis of the lumbar region and HLD -sildenafil 20 mg, on-demand-dosing  No chief complaint on file.   HPI: Curtis [name] is a 73 y.o. male who presents today for 6 month follow up.   Previous records reviewed.     I PSS ***        Score:  1-7 Mild 8-19 Moderate 20-35 Severe  SHIM ***       Score: 1-7 Severe ED 8-11 Moderate ED 12-16 Mild-Moderate ED 17-21 Mild ED 22-25 No ED  PMH: Past Medical History:  Diagnosis Date   Arthritis    Gout    HTN (hypertension)    Hx of dysplastic nevus 12/14/2019   L lower flank Moderate atypia   Hyperlipidemia    Hypogonadism in male    Low back pain    Morton neuroma, left    Pre-diabetes    Vertigo    none for several years   Surgical History: Past Surgical History:  Procedure Laterality Date   ACHILLES TENDON SURGERY Right 1995   CATARACT EXTRACTION W/PHACO Left 09/05/2022   Procedure: CATARACT EXTRACTION PHACO AND INTRAOCULAR LENS PLACEMENT (IOC) LEFT 4.99 00:44.5;  Surgeon: Lockie Mola, MD;  [facility];  Service: Ophthalmology;  Laterality: Left;   CATARACT EXTRACTION W/PHACO Right 09/19/2022   Procedure: CATARACT EXTRACTION PHACO AND INTRAOCULAR LENS PLACEMENT (IOC) RIGHT  5.84  00:42.1;  Surgeon: Lockie Mola, MD;  Location: [facility];  Service: Ophthalmology;  Laterality: Right;   HERNIA REPAIR  2002   double   Surgery on Left pinky in 2000      Home Medications:  Allergies as of 09/19/2023   No Known Allergies      Medication List        Accurate as of September 17, 2023  9:35 PM. If you have any questions, ask your nurse or doctor.          allopurinol 300 MG tablet Commonly known as: ZYLOPRIM TAKE 1 TABLET BY MOUTH DAILY   amLODipine 10 MG tablet Commonly known as: NORVASC TAKE 1 TABLET BY MOUTH DAILY.   BD SafetyGlide Shielded Needle 21G X 1-1/2" Misc Generic drug: NEEDLE (DISP) 21 G 0.5 mLs by Does not apply route every 14 (fourteen) days.   cholecalciferol 25 MCG (1000 UNIT) tablet Commonly known as: VITAMIN D3 Take 2,000 Units by mouth daily.   colchicine 0.6 MG tablet Take 1 tablet (0.6 mg total) by mouth 2 (two) times daily as needed (gout flares).   lisinopril 20 MG tablet Commonly known as: ZESTRIL TAKE 1 TABLET BY MOUTH DAILY.   lisinopril-hydrochlorothiazide 20-25 MG tablet Commonly known as: ZESTORETIC TAKE 1 TABLET BY MOUTH DAILY.   multivitamin capsule Take 1 capsule by mouth daily.   pravastatin 40 MG tablet Commonly known as: PRAVACHOL TAKE 1 TABLET BY MOUTH DAILY.   SAFETY-GARD Needle 18G Misc Generic drug: Hypodermic Needles-Disposable 0.5 mLs by Does not apply route every 14 (fourteen) days.   sildenafil 20 MG tablet Commonly known as: REVATIO TAKE 1 TABLET BY MOUTH AS NEEDED, TAKE  3-5 TABLETS AS NEEDED PROIR TO INTERCOUSE   Syringe (Disposable) 3 ML Misc 0.5 mLs by Does not apply route every 14 (fourteen) days.   Tart Cherry 1200 MG Caps Take 1,200 mg by mouth daily in the afternoon.   testosterone cypionate 200 MG/ML injection Commonly known as: DEPOTESTOSTERONE CYPIONATE Inject 0.5 cc every fourteen days   Turmeric 500 MG Caps Take by mouth.   Zinc 50 MG Tabs Take 50 mg by mouth daily in the afternoon.       Allergies: No Known Allergies  Family History: Family History  Problem Relation Age of Onset   Hypertension Mother    Arthritis Mother     Vision loss Mother        due to age   Cancer Father        in the liver   Social History:  reports that he has never smoked. He has never been exposed to tobacco smoke. He quit smokeless tobacco use about 16 years ago.  His smokeless tobacco use included snuff and chew. He reports current alcohol use of about 2.0 - 4.0 standard drinks of alcohol per week. He reports that he does not use drugs.  For pertinent review of systems please refer to history of present illness  Physical Exam: There were no vitals taken for this visit.  Constitutional:  Well nourished. Alert and oriented, No acute distress. HEENT: [place] AT, moist mucus membranes.  Trachea midline, no masses. Cardiovascular: No clubbing, cyanosis, or edema. Respiratory: Normal respiratory effort, no increased work of breathing. GI: Abdomen is soft, non tender, non distended, no abdominal masses. Liver and spleen not palpable.  No hernias appreciated.  Stool sample for occult testing is not indicated.   GU: No CVA tenderness.  No bladder fullness or masses.  Patient with circumcised/uncircumcised phallus. ***Foreskin easily retracted***  Urethral meatus is patent.  No penile discharge. No penile lesions or rashes. Scrotum without lesions, cysts, rashes and/or edema.  Testicles are located scrotally bilaterally. No masses are appreciated in the testicles. Left and right epididymis are normal. Rectal: Patient with  normal sphincter tone. Anus and perineum without scarring or rashes. No rectal masses are appreciated. Prostate is approximately *** grams, *** nodules are appreciated. Seminal vesicles are normal. Skin: No rashes, bruises or suspicious lesions. Lymph: No cervical or inguinal adenopathy. Neurologic: Grossly intact, no focal deficits, moving all 4 extremities. Psychiatric: Normal mood and affect.   Laboratory Data: Lab Results  Component Value Date   TESTOSTERONE 439 09/12/2023      Latest Ref Rng & Units 09/12/2023    8:06 AM  03/12/2023    8:05 AM 09/10/2022    8:09 AM  CBC  Hemoglobin 13.0 - 17.7 g/dL 45.4  09.8  11.9   Hematocrit 37.5 - 51.0 % 44.0  41.5  43.1    Component     Latest Ref Rng 09/12/2023  Prostate Specific Ag, Serum     0.0 - 4.0 ng/mL 2.8   I have independently reviewed the labs.  Pertinent imaging N/A  Assessment & Plan:    1. Testosterone deficiency  - testosterone level is at therapeutic levels - hemoglobin/hematocrit normal - continue his testosterone cypionate 200 mg/cc, 0.5 mg every 14 days-refills given   2. BPH with LUTS -PSA stable -DRE benign -continue conservative management, avoiding bladder irritants and timed voiding's -He did ask about a minimally invasive procedure for the prostate, so we did discuss UroLift and HoLEP, but he would like to defer these  treatment options at this time   3. Erectile dysfunction:    -Continue sildenafil 20 mg, on-demand dosing  6 months for PSA, testosterone (one week after injection) H & H.  No follow-ups on file.  Cloretta Ned  [facility] [address] [place], [place] [address] [phone]

## 2023-09-19 ENCOUNTER — Encounter: Payer: Self-pay | Admitting: Urology

## 2023-09-19 ENCOUNTER — Ambulatory Visit: Payer: Medicare Other | Admitting: Urology

## 2023-09-19 VITALS — BP 160/63 | HR 76

## 2023-09-19 DIAGNOSIS — E291 Testicular hypofunction: Secondary | ICD-10-CM | POA: Diagnosis not present

## 2023-09-19 DIAGNOSIS — N401 Enlarged prostate with lower urinary tract symptoms: Secondary | ICD-10-CM | POA: Diagnosis not present

## 2023-09-19 DIAGNOSIS — E349 Endocrine disorder, unspecified: Secondary | ICD-10-CM

## 2023-09-19 DIAGNOSIS — N529 Male erectile dysfunction, unspecified: Secondary | ICD-10-CM | POA: Diagnosis not present

## 2023-09-19 DIAGNOSIS — N138 Other obstructive and reflux uropathy: Secondary | ICD-10-CM

## 2023-09-19 MED ORDER — TESTOSTERONE CYPIONATE 200 MG/ML IM SOLN: INTRAMUSCULAR | 0 refills | Status: AC

## 2023-09-19 MED ORDER — SILDENAFIL CITRATE 20 MG PO TABS: ORAL_TABLET | ORAL | 11 refills | Status: AC

## 2023-10-09 ENCOUNTER — Ambulatory Visit (INDEPENDENT_AMBULATORY_CARE_PROVIDER_SITE_OTHER): Payer: Medicare Other | Admitting: Emergency Medicine

## 2023-10-09 VITALS — Ht 69.0 in | Wt 197.0 lb

## 2023-10-09 DIAGNOSIS — Z0000 Encounter for general adult medical examination without abnormal findings: Secondary | ICD-10-CM | POA: Diagnosis not present

## 2023-10-09 NOTE — Patient Instructions (Signed)
 [name] , Thank you for taking time to come for your Medicare Wellness Visit. I appreciate your ongoing commitment to your health goals. Please review the following plan we discussed and let me know if I can assist you in the future.   Referrals/Orders/Follow-Ups/Clinician Recommendations: I have scheduled you an appointment with Dr. Beryle Flock for 11/01/23 @ 9:20am (arrive 10-15 minutes early to register).  This is a list of the screening recommended for you and due dates:  Health Maintenance  Topic Date Due   Zoster (Shingles) Vaccine (1 of 2) 03/14/1970   DTaP/Tdap/Td vaccine (3 - Td or Tdap) 10/02/2020   COVID-19 Vaccine (4 - 2024-25 season) 03/31/2023   Flu Shot  10/28/2023*   Medicare Annual Wellness Visit  10/08/2024   Colon Cancer Screening  05/14/2027   Pneumonia Vaccine  Completed   Hepatitis C Screening  Completed   HPV Vaccine  Aged Out  *Topic was postponed. The date shown is not the original due date.    Advanced directives: (Copy Requested) Please bring a copy of your health care power of attorney and living will to the office to be added to your chart at your convenience. You can mail to [facility] [address] [place], [place] [address] or email to [email or web address]  Next Medicare Annual Wellness Visit scheduled for next year: Yes, 10/14/24 @ 1:10pm (video visit)

## 2023-10-09 NOTE — Progress Notes (Signed)
 Subjective:   Curtis [name] is a 73 y.o. who presents for a Medicare Wellness preventive visit.  Visit Complete: Virtual I connected with  [name] on 10/09/23 by a audio enabled telemedicine application and verified that I am speaking with the correct person using two identifiers.  Patient Location: Home  Provider Location: Home Office  I discussed the limitations of evaluation and management by telemedicine. The patient expressed understanding and agreed to proceed.  Vital Signs: Because this visit was a virtual/telehealth visit, some criteria may be missing or patient reported. Any vitals not documented were not able to be obtained and vitals that have been documented are patient reported.  VideoDeclined- This patient declined Librarian, academic. Therefore the visit was completed with audio only.  AWV Questionnaire: Yes: Patient Medicare AWV questionnaire was completed by the patient on 10/05/23; I have confirmed that all information answered by patient is correct and no changes since this date.  Cardiac Risk Factors include: advanced age (>51men, >71 women);male gender;hypertension;dyslipidemia;Other (see comment), Risk factor comments: prediabetic     Objective:    Today's Vitals   10/09/23 1007  Weight: 197 lb (89.4 kg)  Height: 5\' 9"  (1.753 m)   Body mass index is 29.09 kg/m.     10/09/2023   10:18 AM 09/24/2022   10:30 AM 09/19/2022    8:51 AM 09/05/2022   11:58 AM 09/20/2021   10:28 AM 01/25/2020    9:33 AM 01/06/2019   10:23 AM  Advanced Directives  Does Patient Have a Medical Advance Directive? Yes Yes Yes Yes No Yes Yes  Type of Estate agent of [place];Living will   Living will;Healthcare Power of [employer] Power of [place];Living will Living will;Healthcare Power of Attorney  Does patient want to make changes to medical advance directive? No - Patient declined        Copy of Healthcare  Power of Attorney in Chart? No - copy requested No - copy requested No - copy requested No - copy requested  No - copy requested No - copy requested  Would patient like information on creating a medical advance directive?     No - Patient declined      Current Medications (verified) Outpatient Encounter Medications as of 10/09/2023  Medication Sig   allopurinol (ZYLOPRIM) 300 MG tablet TAKE 1 TABLET BY MOUTH DAILY   amLODipine (NORVASC) 10 MG tablet TAKE 1 TABLET BY MOUTH DAILY.   cholecalciferol (VITAMIN D3) 25 MCG (1000 UNIT) tablet Take 2,000 Units by mouth daily.   colchicine 0.6 MG tablet Take 1 tablet (0.6 mg total) by mouth 2 (two) times daily as needed (gout flares).   Hypodermic Needles-Disposable (SAFETY-GARD NEEDLE 18G) MISC 0.5 mLs by Does not apply route every 14 (fourteen) days.   lisinopril (ZESTRIL) 20 MG tablet TAKE 1 TABLET BY MOUTH DAILY.   lisinopril-hydrochlorothiazide (ZESTORETIC) 20-25 MG tablet TAKE 1 TABLET BY MOUTH DAILY.   Multiple Vitamin (MULTIVITAMIN) capsule Take 1 capsule by mouth daily.   NEEDLE, DISP, 21 G (BD SAFETYGLIDE SHIELDED NEEDLE) 21G X 1-1/2" MISC 0.5 mLs by Does not apply route every 14 (fourteen) days.   pravastatin (PRAVACHOL) 40 MG tablet TAKE 1 TABLET BY MOUTH DAILY.   sildenafil (REVATIO) 20 MG tablet TAKE 1 TABLET BY MOUTH AS NEEDED, TAKE 3-5 TABLETS AS NEEDED PROIR TO INTERCOUSE   Tart Cherry 1200 MG CAPS Take 1,200 mg by mouth daily in the afternoon.   testosterone cypionate (DEPOTESTOSTERONE CYPIONATE) 200  MG/ML injection Inject 0.5 cc every fourteen days   Turmeric 500 MG CAPS Take by mouth.   Syringe, Disposable, 3 ML MISC 0.5 mLs by Does not apply route every 14 (fourteen) days. (Patient not taking: Reported on 10/09/2023)   Zinc 50 MG TABS Take 50 mg by mouth daily in the afternoon. (Patient not taking: Reported on 10/09/2023)   No facility-administered encounter medications on file as of 10/09/2023.    Allergies (verified) Patient has  no known allergies.   History: Past Medical History:  Diagnosis Date   Arthritis    GERD (gastroesophageal reflux disease) 2000   On occasion lactose intolerance   Gout    HTN (hypertension)    Hx of dysplastic nevus 12/14/2019   L lower flank Moderate atypia   Hyperlipidemia    Hypogonadism in male    Low back pain    Morton neuroma, left    Pre-diabetes    Vertigo    none for several years   Past Surgical History:  Procedure Laterality Date   ACHILLES TENDON SURGERY Right 07/30/1993   CATARACT EXTRACTION W/PHACO Left 09/05/2022   Procedure: CATARACT EXTRACTION PHACO AND INTRAOCULAR LENS PLACEMENT (IOC) LEFT 4.99 00:44.5;  Surgeon: Lockie Mola, MD;  Location[facility];  Service: Ophthalmology;  Laterality: Left;   CATARACT EXTRACTION W/PHACO Right 09/19/2022   Procedure: CATARACT EXTRACTION PHACO AND INTRAOCULAR LENS PLACEMENT (IOC) RIGHT  5.84  00:42.1;  Surgeon: Lockie Mola, MD;  Location: [facility];  Service: Ophthalmology;  Laterality: Right;   EYE SURGERY  2 1 2023   Eyelid surgery   FRACTURE SURGERY  2000   Left pinky repIr   HERNIA REPAIR  07/30/2000   double   Surgery on Left pinky in 2000     Family History  Problem Relation Age of Onset   Hypertension Mother    Arthritis Mother    Vision loss Mother        due to age   Hearing loss Mother    Cancer Father        in the liver   Alcohol abuse Father    Hypertension Father    Vision loss Father    Stroke Maternal Grandmother    Stroke Maternal Grandfather    Stroke Paternal Grandfather    Social History   Socioeconomic History   Marital status: Married    Spouse name: Cindi   Number of children: 2   Years of education: Not on file   Highest education level: Master's degree (e.g., MA, MS, MEng, MEd, MSW, MBA)  Occupational History   Occupation: Development worker, international aid    Comment: retired  Tobacco Use   Smoking status: Never    Passive exposure: Never    Smokeless tobacco: Former    Types: Snuff, Chew    Quit date: 2009   Tobacco comments:    Quit chewing tobacco 30 yesrs ago  Vaping Use   Vaping status: Never Used  Substance and Sexual Activity   Alcohol use: Yes    Alcohol/week: 2.0 standard drinks of alcohol    Types: 2 Cans of beer per week    Comment: 2 beers once a week   Drug use: No   Sexual activity: Yes    Birth control/protection: Post-menopausal  Other Topics Concern   Not on file  Social History Narrative   Not on file   Social Drivers of Health   Financial Resource Strain: Low Risk  (10/09/2023)   Overall Physicist, medical Strain (  CARDIA)    Difficulty of Paying Living Expenses: Not hard at all  Food Insecurity: No Food Insecurity (10/09/2023)   Hunger Vital Sign    Worried About Running Out of Food in the Last Year: Never true    Ran Out of Food in the Last Year: Never true  Transportation Needs: No Transportation Needs (10/09/2023)   PRAPARE - Administrator, Civil Service (Medical): No    Lack of Transportation (Non-Medical): No  Physical Activity: Sufficiently Active (10/09/2023)   Exercise Vital Sign    Days of Exercise per Week: 5 days    Minutes of Exercise per Session: 60 min  Stress: No Stress Concern Present (10/09/2023)   [employer] of Occupational Health - Occupational Stress Questionnaire    Feeling of Stress : Not at all  Social Connections: Socially Integrated (10/09/2023)   Social Connection and Isolation Panel [NHANES]    Frequency of Communication with Friends and Family: More than three times a week    Frequency of Social Gatherings with Friends and Family: More than three times a week    Attends Religious Services: More than 4 times per year    Active Member of [employer] or Organizations: Yes    Attends Engineer, structural: More than 4 times per year    Marital Status: Married    Tobacco Counseling Counseling given: Not Answered Tobacco comments: Quit chewing  tobacco 30 yesrs ago    Clinical Intake:  Pre-visit preparation completed: Yes  Pain : No/denies pain     BMI - recorded: 29.09 Nutritional Status: BMI 25 -29 Overweight Nutritional Risks: None Diabetes: No  How often do you need to have someone help you when you read instructions, pamphlets, or other written materials from your doctor or pharmacy?: 1 - Never  Interpreter Needed?: No  Information entered by :: Tora Kindred, CMA   Activities of Daily Living     10/09/2023   10:10 AM 10/05/2023   10:50 AM  In your present state of health, do you have any difficulty performing the following activities:  Hearing? 0 0  Vision? 0 0  Difficulty concentrating or making decisions? 0 0  Walking or climbing stairs? 0 0  Dressing or bathing? 0 0  Doing errands, shopping? 0 0  Preparing Food and eating ? N N  Using the Toilet? N N  In the past six months, have you accidently leaked urine? N N  Do you have problems with loss of bowel control? N N  Managing your Medications? N N  Managing your Finances? N N  Housekeeping or managing your Housekeeping? N N    Patient Care Team: Erasmo Downer, MD as PCP - General (Family Medicine) Hulan Fray as Physician Assistant (Urology) Linus Galas, [place] (Podiatry) Coll, Deneise Lever., PA-C (Orthopedic Surgery) Willeen Niece, MD (Dermatology) Lockie Mola, MD as Referring Physician (Ophthalmology)  Indicate any recent Medical Services you may have received from other than [facility] providers in the past year (date may be approximate).     Assessment:   This is a routine wellness examination for [place].  Hearing/Vision screen Hearing Screening - Comments:: Denies hearing loss Vision Screening - Comments:: Gets eye exams, Dr. Inez Pilgrim, [facility]    Goals Addressed               This Visit's Progress     Patient Stated (pt-stated)        Lose 5 lbs  Depression Screen      10/09/2023   10:15 AM 09/24/2022   10:26 AM 06/11/2022    8:22 AM 12/07/2021    1:23 PM 11/27/2021    9:24 AM 10/23/2021    1:32 PM 09/20/2021   10:27 AM  PHQ 2/9 Scores  PHQ - 2 Score 0 0 0 0 0 0 0  PHQ- 9 Score 0  0 0 0 0     Fall Risk     10/09/2023   10:19 AM 10/05/2023   10:50 AM 09/20/2022    9:33 AM 06/11/2022    8:22 AM 12/07/2021    1:23 PM  Fall Risk   Falls in the past year? 0 0 0 0 0  Number falls in past yr: 0 0 0 0 0  Injury with Fall? 0  0 0 0  Risk for fall due to : No Fall Risks   No Fall Risks No Fall Risks  Follow up Falls prevention discussed;Falls evaluation completed   Falls evaluation completed Falls evaluation completed    MEDICARE RISK AT HOME:  Medicare Risk at Home Any stairs in or around the home?: No If so, are there any without handrails?: No Home free of loose throw rugs in walkways, pet beds, electrical cords, etc?: Yes Adequate lighting in your home to reduce risk of falls?: Yes Life alert?: No Use of a cane, walker or w/c?: No Grab bars in the bathroom?: Yes Shower chair or bench in shower?: No Elevated toilet seat or a handicapped toilet?: No  TIMED UP AND GO:  Was the test performed?  No  Cognitive Function: 6CIT completed        10/09/2023   10:20 AM 09/24/2022   10:31 AM 01/25/2020    9:37 AM 01/06/2019   10:29 AM 01/02/2018    9:53 AM  6CIT Screen  What Year? 0 points 0 points 0 points 0 points 0 points  What month? 0 points 0 points 0 points 0 points 0 points  What time? 0 points 0 points 0 points 0 points 0 points  Count back from 20 0 points 0 points 0 points 0 points 0 points  Months in reverse 0 points 0 points 0 points 0 points 0 points  Repeat phrase 0 points 0 points 0 points 0 points 4 points  Total Score 0 points 0 points 0 points 0 points 4 points    Immunizations Immunization History  Administered Date(s) Administered   Fluad Quad(high Dose 65+) 04/14/2019, 05/12/2020   Influenza-Unspecified 05/07/2017, 04/25/2018    Moderna Sars-Covid-2 Vaccination 09/26/2019, 10/24/2019, 06/14/2020   Pneumococcal Conjugate-13 01/01/2017   Pneumococcal Polysaccharide-23 01/02/2018   Td 08/06/2001   Tdap 10/03/2010   Zoster, Live 11/11/2015    Screening Tests Health Maintenance  Topic Date Due   Zoster Vaccines- Shingrix (1 of 2) 03/14/1970   DTaP/Tdap/Td (3 - Td or Tdap) 10/02/2020   COVID-19 Vaccine (4 - 2024-25 season) 03/31/2023   INFLUENZA VACCINE  10/28/2023 (Originally 02/28/2023)   Medicare Annual Wellness (AWV)  10/08/2024   Colonoscopy  05/14/2027   Pneumonia Vaccine 35+ Years old  Completed   Hepatitis C Screening  Completed   HPV VACCINES  Aged Out    Health Maintenance  Health Maintenance Due  Topic Date Due   Zoster Vaccines- Shingrix (1 of 2) 03/14/1970   DTaP/Tdap/Td (3 - Td or Tdap) 10/02/2020   COVID-19 Vaccine (4 - 2024-25 season) 03/31/2023   Health Maintenance Items Addressed: See Nurse Notes  Additional  Screening:  Vision Screening: Recommended annual ophthalmology exams for early detection of glaucoma and other disorders of the eye.  Dental Screening: Recommended annual dental exams for proper oral hygiene  Community Resource Referral / Chronic Care Management: CRR required this visit?  No   CCM required this visit?  No     Plan:     I have personally reviewed and noted the following in the patient's chart:   Medical and social history Use of alcohol, tobacco or illicit drugs  Current medications and supplements including opioid prescriptions. Patient is not currently taking opioid prescriptions. Functional ability and status Nutritional status Physical activity Advanced directives List of other physicians Hospitalizations, surgeries, and ER visits in previous 12 months Vitals Screenings to include cognitive, depression, and falls Referrals and appointments  In addition, I have reviewed and discussed with patient certain preventive protocols, quality metrics,  and best practice recommendations. A written personalized care plan for preventive services as well as general preventive health recommendations were provided to patient.     Tora Kindred, CMA   10/09/2023   After Visit Summary: (MyChart) Due to this being a telephonic visit, the after visit summary with patients personalized plan was offered to patient via MyChart   Notes:  Declined flu, covid and shingles vaccines Patient states Tdap is UTD. Had at [facility] pharmacy within the last 2 years.

## 2023-10-11 ENCOUNTER — Other Ambulatory Visit: Payer: Self-pay | Admitting: Physician Assistant

## 2023-10-11 NOTE — Telephone Encounter (Signed)
 Requested Prescriptions  Pending Prescriptions Disp Refills   amLODipine (NORVASC) 10 MG tablet [Pharmacy Med Name: AMLODIPINE BESYLATE 10 MG TAB] 90 tablet 0    Sig: TAKE 1 TABLET BY MOUTH DAILY.     Cardiovascular: Calcium Channel Blockers 2 Failed - 10/11/2023  1:30 PM      Failed - Last BP in normal range    BP Readings from Last 1 Encounters:  09/19/23 (!) 160/63         Failed - Valid encounter within last 6 months    Recent Outpatient Visits           1 year ago Essential hypertension   [place] [facility] [place], Marzella Schlein, MD   1 year ago Encounter for annual health examination   [facility] [place], Marzella Schlein, MD   1 year ago Essential hypertension   [facility] [place], Marzella Schlein, MD   1 year ago Essential hypertension   [facility] [place], Marzella Schlein, MD   2 years ago Essential hypertension   [facility] [place], Marzella Schlein, MD       Future Appointments             In 8 months Willeen Niece, MD [facility]            Passed - Last Heart Rate in normal range    Pulse Readings from Last 1 Encounters:  09/19/23 76          pravastatin (PRAVACHOL) 40 MG tablet [Pharmacy Med Name: PRAVASTATIN SODIUM 40 MG TAB] 90 tablet 0    Sig: TAKE 1 TABLET BY MOUTH DAILY.     Cardiovascular:  Antilipid - Statins Failed - 10/11/2023  1:30 PM      Failed - Valid encounter within last 12 months    Recent Outpatient Visits           1 year ago Essential hypertension   [place] [facility] [place], Marzella Schlein, MD   1 year ago Encounter for annual health examination   [facility] [employer], Marzella Schlein, MD   1 year ago Essential hypertension   [place] [facility] [place], Marzella Schlein, MD   1 year ago Essential hypertension   [place] [facility] [place], Marzella Schlein, MD   2 years ago Essential hypertension   [place] [facility] Erasmo Downer, MD       Future Appointments             In 8 months Willeen Niece, MD  [facility]            Failed - Lipid Panel in normal range within the last 12 months    Cholesterol, Total  Date Value Ref Range Status  12/08/2021 151 100 - 199 mg/dL Final   LDL Chol Calc (NIH)  Date Value Ref Range Status  12/08/2021 84 0 - 99 mg/dL Final   HDL  Date Value Ref Range Status  12/08/2021 37 (L) >39 mg/dL Final   Triglycerides  Date Value Ref Range Status  12/08/2021 176 (H) 0 - 149 mg/dL Final         Passed - Patient is not pregnant

## 2023-11-01 ENCOUNTER — Ambulatory Visit (INDEPENDENT_AMBULATORY_CARE_PROVIDER_SITE_OTHER): Admitting: Family Medicine

## 2023-11-01 ENCOUNTER — Encounter: Payer: Self-pay | Admitting: Family Medicine

## 2023-11-01 VITALS — BP 136/54 | HR 67 | Ht 69.0 in | Wt 204.3 lb

## 2023-11-01 DIAGNOSIS — R7303 Prediabetes: Secondary | ICD-10-CM

## 2023-11-01 DIAGNOSIS — I10 Essential (primary) hypertension: Secondary | ICD-10-CM

## 2023-11-01 DIAGNOSIS — Z0000 Encounter for general adult medical examination without abnormal findings: Secondary | ICD-10-CM

## 2023-11-01 DIAGNOSIS — E782 Mixed hyperlipidemia: Secondary | ICD-10-CM

## 2023-11-01 NOTE — Progress Notes (Signed)
 Complete physical exam   Patient: Curtis [name]   DOB: September 05, 1950   73 y.o. Male  MRN: [id number] Visit Date: 11/01/2023  Today's healthcare provider: Shirlee Latch, MD   Chief Complaint  Patient presents with   Annual Exam    Last completed 12/07/21, AWV completed 10/09/23 Diet -  high protein low carb Exercise - walking 3 miles maybe 4-5 times a week Feeling - well Sleeping - well Concerns -  none   Subjective    Curtis [name] is a 73 y.o. male who presents today for a complete physical exam.   Discussed the use of AI scribe software for clinical note transcription with the patient, who gave verbal consent to proceed.  History of Present Illness   The patient, with a history of high blood pressure and prediabetes, presents for a routine physical and wellness check. He reports good adherence to his medication regimen, which includes lisinopril HCTZ, extra lisinopril, amlodipine, and pravastatin. Recently, he has incorporated dietary changes, including daily consumption of avocados and a higher dosage of liquid turmeric. He believes these changes have positively impacted his blood pressure and chronic back pain. The patient has a history of prediabetes and is due for lab work to monitor his A1c, kidney and liver function, and cholesterol levels. He has completed his colonoscopy and pneumonia shots, but has not started his shingles vaccines. The patient also mentions a family incident where a family member had a fall and hit their head, but no further medical issues arose from the incident.        Last depression screening scores    10/09/2023   10:15 AM 09/24/2022   10:26 AM 06/11/2022    8:22 AM  PHQ 2/9 Scores  PHQ - 2 Score 0 0 0  PHQ- 9 Score 0  0   Last fall risk screening    10/09/2023   10:19 AM  Fall Risk   Falls in the past year? 0  Number falls in past yr: 0  Injury with Fall? 0  Risk for fall due to : No Fall Risks  Follow up Falls  prevention discussed;Falls evaluation completed        Medications: Outpatient Medications Prior to Visit  Medication Sig   allopurinol (ZYLOPRIM) 300 MG tablet TAKE 1 TABLET BY MOUTH DAILY   amLODipine (NORVASC) 10 MG tablet TAKE 1 TABLET BY MOUTH DAILY.   colchicine 0.6 MG tablet Take 1 tablet (0.6 mg total) by mouth 2 (two) times daily as needed (gout flares).   Hypodermic Needles-Disposable (SAFETY-GARD NEEDLE 18G) MISC 0.5 mLs by Does not apply route every 14 (fourteen) days.   lisinopril (ZESTRIL) 20 MG tablet TAKE 1 TABLET BY MOUTH DAILY.   lisinopril-hydrochlorothiazide (ZESTORETIC) 20-25 MG tablet TAKE 1 TABLET BY MOUTH DAILY.   loratadine (CLARITIN) 10 MG tablet Take 10 mg by mouth daily as needed for allergies.   Multiple Vitamin (MULTIVITAMIN) capsule Take 1 capsule by mouth daily.   NEEDLE, DISP, 21 G (BD SAFETYGLIDE SHIELDED NEEDLE) 21G X 1-1/2" MISC 0.5 mLs by Does not apply route every 14 (fourteen) days.   pravastatin (PRAVACHOL) 40 MG tablet TAKE 1 TABLET BY MOUTH DAILY.   sildenafil (REVATIO) 20 MG tablet TAKE 1 TABLET BY MOUTH AS NEEDED, TAKE 3-5 TABLETS AS NEEDED PROIR TO INTERCOUSE   Syringe, Disposable, 3 ML MISC 0.5 mLs by Does not apply route every 14 (fourteen) days.   Tart Cherry 1200 MG CAPS Take 1,200 mg by mouth  daily in the afternoon.   testosterone cypionate (DEPOTESTOSTERONE CYPIONATE) 200 MG/ML injection Inject 0.5 cc every fourteen days   TURMERIC PO Take 1,000 mg by mouth daily.   [DISCONTINUED] Turmeric 500 MG CAPS Take by mouth.   [DISCONTINUED] cholecalciferol (VITAMIN D3) 25 MCG (1000 UNIT) tablet Take 2,000 Units by mouth daily.   [DISCONTINUED] Zinc 50 MG TABS Take 50 mg by mouth daily in the afternoon. (Patient not taking: Reported on 10/09/2023)   No facility-administered medications prior to visit.    Review of Systems    Objective    BP (!) 136/54 (BP Location: Right Arm, Patient Position: Sitting, Cuff Size: Normal)   Pulse 67   Ht  5\' 9"  (1.753 m)   Wt 204 lb 4.8 oz (92.7 kg)   SpO2 97%   BMI 30.17 kg/m    Physical Exam Vitals reviewed.  Constitutional:      General: He is not in acute distress.    Appearance: Normal appearance. He is well-developed. He is not diaphoretic.  HENT:     Head: Normocephalic and atraumatic.     Right Ear: Tympanic membrane, ear canal and external ear normal.     Left Ear: Tympanic membrane, ear canal and external ear normal.     Nose: Nose normal.     Mouth/Throat:     Mouth: Mucous membranes are moist.     Pharynx: Oropharynx is clear. No oropharyngeal exudate.  Eyes:     General: No scleral icterus.    Conjunctiva/sclera: Conjunctivae normal.     Pupils: Pupils are equal, round, and reactive to light.  Neck:     Thyroid: No thyromegaly.  Cardiovascular:     Rate and Rhythm: Normal rate and regular rhythm.     Heart sounds: Normal heart sounds. No murmur heard. Pulmonary:     Effort: Pulmonary effort is normal. No respiratory distress.     Breath sounds: Normal breath sounds. No wheezing or rales.  Abdominal:     General: There is no distension.     Palpations: Abdomen is soft.     Tenderness: There is no abdominal tenderness.  Musculoskeletal:        General: No deformity.     Cervical back: Neck supple.     Right lower leg: No edema.     Left lower leg: No edema.  Lymphadenopathy:     Cervical: No cervical adenopathy.  Skin:    General: Skin is warm and dry.     Findings: No rash.  Neurological:     Mental Status: He is alert and oriented to person, place, and time. Mental status is at baseline.     Gait: Gait normal.  Psychiatric:        Mood and Affect: Mood normal.        Behavior: Behavior normal.        Thought Content: Thought content normal.      No results found for any visits on 11/01/23.  Assessment & Plan    Routine Health Maintenance and Physical Exam  Exercise Activities and Dietary recommendations  Goals       Patient Stated  (pt-stated)      Lose 5 lbs        Immunization History  Administered Date(s) Administered   Fluad Quad(high Dose 65+) 04/14/2019, 05/12/2020   Influenza-Unspecified 05/07/2017, 04/25/2018   Moderna Sars-Covid-2 Vaccination 09/26/2019, 10/24/2019, 06/14/2020   Pneumococcal Conjugate-13 01/01/2017   Pneumococcal Polysaccharide-23 01/02/2018   Td 08/06/2001   Tdap  10/03/2010   Zoster, Live 11/11/2015    Health Maintenance  Topic Date Due   Zoster Vaccines- Shingrix (1 of 2) 03/14/1970   DTaP/Tdap/Td (3 - Td or Tdap) 10/02/2020   COVID-19 Vaccine (4 - 2024-25 season) 03/31/2023   INFLUENZA VACCINE  02/28/2024   Medicare Annual Wellness (AWV)  10/08/2024   Colonoscopy  05/14/2027   Pneumonia Vaccine 66+ Years old  Completed   Hepatitis C Screening  Completed   HPV VACCINES  Aged Out    Discussed health benefits of physical activity, and encouraged him to engage in regular exercise appropriate for his age and condition.  Problem List Items Addressed This Visit       Cardiovascular and Mediastinum   Essential hypertension   Hypertension is well-controlled with lisinopril HCTZ 20/25 mg, lisinopril 20 mg, and amlodipine 10 mg. He is compliant with the medication regimen and has been experimenting with lifestyle modifications, including avocados, liquid turmeric, cold showers, and breathing exercises, which may contribute to improved blood pressure control. Current blood pressure readings are satisfactory. - Continue current antihypertensive regimen: lisinopril HCTZ 20/25 mg, lisinopril 20 mg, amlodipine 10 mg. - Schedule a six-month follow-up for blood pressure monitoring.       Relevant Orders   Comprehensive metabolic panel with GFR     Other   Hyperlipidemia   Previously well controlled Continue statin Repeat FLP and CMP annually      Relevant Orders   Comprehensive metabolic panel with GFR   Lipid panel   Prediabetes   Previously classified as prediabetic in  early 2023 but returned to normal glucose levels later that year. Interested in assessing current glucose control, particularly A1c level, to determine if he remains in the normal range or has reverted to prediabetic status. - Order laboratory tests to assess A1c, kidney and liver function, and cholesterol levels.      Relevant Orders   Hemoglobin A1c   Other Visit Diagnoses       Encounter for annual physical exam    -  Primary   Relevant Orders   Hemoglobin A1c   Comprehensive metabolic panel with GFR   Lipid panel          General Health Maintenance Up to date with colonoscopy screenings, with the next one due in 2028. Completed pneumonia vaccinations and is up to date on tetanus vaccinations. Has not started the shingles vaccine series and is hesitant about the flu vaccine due to past experiences. Discussed the potential recurrence of shingles and the importance of vaccination. - Discuss the potential benefits of the shingles vaccine, considering his history of shingles. - Provide information on the flu vaccine and address concerns about its efficacy. - Ensure tetanus vaccination records are updated in the registry.  Follow-up Due for updated laboratory assessments since no labs have been done since 2023. Plans to monitor blood pressure and overall health status with regular follow-ups. - Provide a lab slip for fasting labs, to be completed at his convenience. - Schedule a six-month follow-up appointment for blood pressure monitoring.       Return in about 6 months (around 05/02/2024) for chronic disease f/u.     Shirlee Latch, MD  [facility] [phone] (phone) [phone] (fax)  [facility]

## 2023-11-01 NOTE — Assessment & Plan Note (Signed)
Previously well controlled Continue statin Repeat FLP and CMP annually

## 2023-11-01 NOTE — Assessment & Plan Note (Signed)
 Previously classified as prediabetic in early 2023 but returned to normal glucose levels later that year. Interested in assessing current glucose control, particularly A1c level, to determine if he remains in the normal range or has reverted to prediabetic status. - Order laboratory tests to assess A1c, kidney and liver function, and cholesterol levels.

## 2023-11-01 NOTE — Assessment & Plan Note (Signed)
 Hypertension is well-controlled with lisinopril HCTZ 20/25 mg, lisinopril 20 mg, and amlodipine 10 mg. He is compliant with the medication regimen and has been experimenting with lifestyle modifications, including avocados, liquid turmeric, cold showers, and breathing exercises, which may contribute to improved blood pressure control. Current blood pressure readings are satisfactory. - Continue current antihypertensive regimen: lisinopril HCTZ 20/25 mg, lisinopril 20 mg, amlodipine 10 mg. - Schedule a six-month follow-up for blood pressure monitoring.

## 2023-11-05 ENCOUNTER — Encounter: Payer: Self-pay | Admitting: Family Medicine

## 2023-11-05 LAB — HEMOGLOBIN A1C
Est. average glucose Bld gHb Est-mCnc: 123 mg/dL
Hgb A1c MFr Bld: 5.9 % — ABNORMAL HIGH (ref 4.8–5.6)

## 2023-11-05 LAB — COMPREHENSIVE METABOLIC PANEL WITH GFR
ALT: 24 IU/L (ref 0–44)
AST: 18 IU/L (ref 0–40)
Albumin: 4.6 g/dL (ref 3.8–4.8)
Alkaline Phosphatase: 55 IU/L (ref 44–121)
BUN/Creatinine Ratio: 17 (ref 10–24)
BUN: 17 mg/dL (ref 8–27)
Bilirubin Total: 0.7 mg/dL (ref 0.0–1.2)
CO2: 21 mmol/L (ref 20–29)
Calcium: 9.5 mg/dL (ref 8.6–10.2)
Chloride: 105 mmol/L (ref 96–106)
Creatinine, Ser: 1.02 mg/dL (ref 0.76–1.27)
Globulin, Total: 2 g/dL (ref 1.5–4.5)
Glucose: 108 mg/dL — ABNORMAL HIGH (ref 70–99)
Potassium: 4.1 mmol/L (ref 3.5–5.2)
Sodium: 143 mmol/L (ref 134–144)
Total Protein: 6.6 g/dL (ref 6.0–8.5)
eGFR: 78 mL/min/{1.73_m2} (ref >59)

## 2023-11-05 LAB — LIPID PANEL
Chol/HDL Ratio: 4.5 ratio (ref 0.0–5.0)
Cholesterol, Total: 175 mg/dL (ref 100–199)
HDL: 39 mg/dL — ABNORMAL LOW (ref >39)
LDL Chol Calc (NIH): 95 mg/dL (ref 0–99)
Triglycerides: 242 mg/dL — ABNORMAL HIGH (ref 0–149)
VLDL Cholesterol Cal: 41 mg/dL — ABNORMAL HIGH (ref 5–40)

## 2023-11-14 ENCOUNTER — Other Ambulatory Visit: Payer: Self-pay | Admitting: Family Medicine

## 2023-11-15 NOTE — Telephone Encounter (Signed)
 Requested medications are due for refill today.  Unsure - please see note  Requested medications are on the active medications list.  yes  Last refill. 09/18/2023 #60 0 rf  Future visit scheduled.   yes  Notes to clinic.  Pt is prescribed 2 lisinopril  medications. Please review for refill.    Requested Prescriptions  Pending Prescriptions Disp Refills   lisinopril  (ZESTRIL ) 20 MG tablet [Pharmacy Med Name: LISINOPRIL  20 MG TAB] 60 tablet 0    Sig: TAKE 1 TABLET BY MOUTH DAILY.     Cardiovascular:  ACE Inhibitors Passed - 11/15/2023 10:51 AM      Passed - Cr in normal range and within 180 days    Creatinine, Ser  Date Value Ref Range Status  11/04/2023 1.02 0.76 - 1.27 mg/dL Final         Passed - K in normal range and within 180 days    Potassium  Date Value Ref Range Status  11/04/2023 4.1 3.5 - 5.2 mmol/L Final         Passed - Patient is not pregnant      Passed - Last BP in normal range    BP Readings from Last 1 Encounters:  11/01/23 (!) 136/54         Passed - Valid encounter within last 6 months    Recent Outpatient Visits           2 weeks ago Encounter for annual physical exam   [facility] [place], Stan Eans, MD       Future Appointments             In 5 months Bacigalupo, Stan Eans, MD [facility]   In 7 months Artemio Larry, MD [facility]

## 2024-01-14 ENCOUNTER — Other Ambulatory Visit: Payer: Self-pay | Admitting: Family Medicine

## 2024-01-27 ENCOUNTER — Ambulatory Visit: Payer: Medicare Other | Admitting: Dermatology

## 2024-03-11 ENCOUNTER — Other Ambulatory Visit: Payer: Self-pay | Admitting: Family Medicine

## 2024-03-11 DIAGNOSIS — I10 Essential (primary) hypertension: Secondary | ICD-10-CM

## 2024-03-11 NOTE — Telephone Encounter (Signed)
 Sent rx to pharmacy

## 2024-03-18 ENCOUNTER — Other Ambulatory Visit: Payer: Medicare Other

## 2024-03-18 DIAGNOSIS — N401 Enlarged prostate with lower urinary tract symptoms: Secondary | ICD-10-CM

## 2024-03-18 DIAGNOSIS — E291 Testicular hypofunction: Secondary | ICD-10-CM

## 2024-03-18 DIAGNOSIS — N529 Male erectile dysfunction, unspecified: Secondary | ICD-10-CM

## 2024-03-18 DIAGNOSIS — E349 Endocrine disorder, unspecified: Secondary | ICD-10-CM

## 2024-03-19 ENCOUNTER — Ambulatory Visit: Payer: Self-pay | Admitting: Urology

## 2024-03-19 LAB — HEMOGLOBIN AND HEMATOCRIT, BLOOD
Hematocrit: 46.8 % (ref 37.5–51.0)
Hemoglobin: 15.7 g/dL (ref 13.0–17.7)

## 2024-03-19 LAB — PSA: Prostate Specific Ag, Serum: 2.6 ng/mL (ref 0.0–4.0)

## 2024-03-19 LAB — TESTOSTERONE: Testosterone: 203 ng/dL — ABNORMAL LOW (ref 264–916)

## 2024-04-16 ENCOUNTER — Other Ambulatory Visit: Payer: Self-pay | Admitting: Family Medicine

## 2024-05-05 ENCOUNTER — Encounter: Payer: Self-pay | Admitting: Family Medicine

## 2024-05-05 ENCOUNTER — Ambulatory Visit (INDEPENDENT_AMBULATORY_CARE_PROVIDER_SITE_OTHER): Admitting: Family Medicine

## 2024-05-05 VITALS — BP 136/52 | HR 63 | Ht 69.0 in | Wt 194.0 lb

## 2024-05-05 DIAGNOSIS — M1A09X1 Idiopathic chronic gout, multiple sites, with tophus (tophi): Secondary | ICD-10-CM | POA: Diagnosis not present

## 2024-05-05 DIAGNOSIS — E782 Mixed hyperlipidemia: Secondary | ICD-10-CM | POA: Diagnosis not present

## 2024-05-05 DIAGNOSIS — I10 Essential (primary) hypertension: Secondary | ICD-10-CM | POA: Diagnosis not present

## 2024-05-05 DIAGNOSIS — R7303 Prediabetes: Secondary | ICD-10-CM | POA: Diagnosis not present

## 2024-05-05 NOTE — Assessment & Plan Note (Signed)
 Hypertension with systolic readings typically in the 130s, occasionally spiking to 160, and diastolic readings dropping to the 40s. - Advise taking lisinopril -hydrochlorothiazide  in the morning and additional lisinopril  and amlodipine  at bedtime to stabilize blood pressure. - Monitor blood pressure readings and report via MyChart after a few weeks to assess the effectiveness of the timing change. - Consider reducing amlodipine  dose from 10 mg to 7 mg if needed to adjust blood pressure further.

## 2024-05-05 NOTE — Progress Notes (Signed)
 Established patient visit   Patient: Curtis [name]   DOB: 04-15-1951   73 y.o. Male  MRN: [id number] Visit Date: 05/05/2024  Today's healthcare provider: Jon Eva, MD   Chief Complaint  Patient presents with   Medical Management of Chronic Issues   Hypertension    Patient reports monitoring at home. He reports on avg his rome readings are typically mid 130's/5's and that he is more concerned with his bottom number than anything due to readings being in 40s sometimes. He reports taking medication as prescribed with no symptoms or side effects to report. He does not smoke. He does exercise. He is consuming a low carb diet but not low sodium.    Subjective    Hypertension   HPI     Hypertension    Additional comments: Patient reports monitoring at home. He reports on avg his rome readings are typically mid 130's/5's and that he is more concerned with his bottom number than anything due to readings being in 40s sometimes. He reports taking medication as prescribed with no symptoms or side effects to report. He does not smoke. He does exercise. He is consuming a low carb diet but not low sodium.       Last edited by Lilian Fitzpatrick, CMA on 05/05/2024  8:28 AM.       Discussed the use of AI scribe software for clinical note transcription with the patient, who gave verbal consent to proceed.  History of Present Illness   Curtis [name] is a 73 year old male with hypertension who presents for blood pressure management.  His diastolic blood pressure has been as low as the 40s, leading to feelings of fatigue and decreased motivation to exercise. Systolic blood pressure at home is typically in the 130s, with occasional spikes to 160, though these are less frequent. He is on lisinopril  40 mg, hydrochlorothiazide  25 mg, and amlodipine  10 mg daily.  He has prediabetes and has adopted a high protein, low carbohydrate diet, avoiding processed foods, resulting  in a five-pound weight loss over the past 60 days.  He has hyperlipidemia and takes pravastatin  40 mg daily. He is on allopurinol  300 mg daily for chronic gout, with no recent flares. He experiences general aches and pains, including shoulder and back issues, but notes improvement with reduced carbohydrate intake.         Medications: Outpatient Medications Prior to Visit  Medication Sig   allopurinol  (ZYLOPRIM ) 300 MG tablet TAKE 1 TABLET BY MOUTH DAILY   amLODipine  (NORVASC ) 10 MG tablet TAKE 1 TABLET BY MOUTH DAILY.   colchicine  0.6 MG tablet Take 1 tablet (0.6 mg total) by mouth 2 (two) times daily as needed (gout flares).   Hypodermic Needles-Disposable (SAFETY-GARD NEEDLE 18G) MISC 0.5 mLs by Does not apply route every 14 (fourteen) days.   lisinopril  (ZESTRIL ) 20 MG tablet TAKE 1 TABLET BY MOUTH DAILY.   lisinopril -hydrochlorothiazide  (ZESTORETIC ) 20-25 MG tablet TAKE 1 TABLET BY MOUTH DAILY.   loratadine  (CLARITIN ) 10 MG tablet Take 10 mg by mouth daily as needed for allergies.   Multiple Vitamin (MULTIVITAMIN) capsule Take 1 capsule by mouth daily.   NEEDLE, DISP, 21 G (BD SAFETYGLIDE SHIELDED NEEDLE) 21G X 1-1/2 MISC 0.5 mLs by Does not apply route every 14 (fourteen) days.   pravastatin  (PRAVACHOL ) 40 MG tablet TAKE 1 TABLET BY MOUTH DAILY.   sildenafil  (REVATIO ) 20 MG tablet TAKE 1 TABLET BY MOUTH AS NEEDED, TAKE 3-5 TABLETS AS  NEEDED PROIR TO INTERCOUSE   Syringe, Disposable, 3 ML MISC 0.5 mLs by Does not apply route every 14 (fourteen) days.   Tart Cherry 1200 MG CAPS Take 1,200 mg by mouth daily in the afternoon.   testosterone  cypionate (DEPOTESTOSTERONE CYPIONATE) 200 MG/ML injection Inject 0.5 cc every fourteen days   TURMERIC PO Take 1,000 mg by mouth daily.   No facility-administered medications prior to visit.    Review of Systems     Objective    BP (!) 136/52 (BP Location: Left Arm, Patient Position: Sitting, Cuff Size: Normal)   Pulse 63   Ht 5' 9 (1.753  m)   Wt 194 lb (88 kg)   SpO2 97%   BMI 28.65 kg/m    Physical Exam Vitals reviewed.  Constitutional:      General: He is not in acute distress.    Appearance: Normal appearance. He is not diaphoretic.  HENT:     Head: Normocephalic and atraumatic.  Eyes:     General: No scleral icterus.    Conjunctiva/sclera: Conjunctivae normal.  Cardiovascular:     Rate and Rhythm: Normal rate and regular rhythm.     Heart sounds: Normal heart sounds. No murmur heard. Pulmonary:     Effort: Pulmonary effort is normal. No respiratory distress.     Breath sounds: Normal breath sounds. No wheezing or rhonchi.  Musculoskeletal:     Cervical back: Neck supple.     Right lower leg: No edema.     Left lower leg: No edema.  Lymphadenopathy:     Cervical: No cervical adenopathy.  Skin:    General: Skin is warm and dry.     Findings: No rash.  Neurological:     Mental Status: He is alert and oriented to person, place, and time. Mental status is at baseline.  Psychiatric:        Mood and Affect: Mood normal.        Behavior: Behavior normal.      No results found for any visits on 05/05/24.  Assessment & Plan     Problem List Items Addressed This Visit       Cardiovascular and Mediastinum   Essential hypertension - Primary   Hypertension with systolic readings typically in the 130s, occasionally spiking to 160, and diastolic readings dropping to the 40s. - Advise taking lisinopril -hydrochlorothiazide  in the morning and additional lisinopril  and amlodipine  at bedtime to stabilize blood pressure. - Monitor blood pressure readings and report via MyChart after a few weeks to assess the effectiveness of the timing change. - Consider reducing amlodipine  dose from 10 mg to 7 mg if needed to adjust blood pressure further.      Relevant Orders   Comprehensive metabolic panel with GFR     Other   Hyperlipidemia   Hyperlipidemia managed with pravastatin  40 mg daily. - Check cholesterol  levels with current lab work.      Relevant Orders   Comprehensive metabolic panel with GFR   Lipid panel   Gout   Chronic gout managed with allopurinol  300 mg daily, with no recent flares and improvement in joint pain. - Check uric acid levels with current lab work to ensure allopurinol  is effective.       Relevant Orders   Uric acid   Prediabetes   Prediabetes with recent dietary changes to a high-protein, low-carb diet, resulting in a 5-pound weight loss. - Continue current dietary changes focusing on high protein and low carbohydrates. - Monitor A1c  levels with current lab work.      Relevant Orders   HgB A1c     Return in about 6 months (around 11/03/2024) for CPE.       Jon Eva, MD  [facility] [phone] (phone) [phone] (fax)  [facility]

## 2024-05-05 NOTE — Assessment & Plan Note (Signed)
 Chronic gout managed with allopurinol  300 mg daily, with no recent flares and improvement in joint pain. - Check uric acid levels with current lab work to ensure allopurinol  is effective.

## 2024-05-05 NOTE — Assessment & Plan Note (Signed)
 Prediabetes with recent dietary changes to a high-protein, low-carb diet, resulting in a 5-pound weight loss. - Continue current dietary changes focusing on high protein and low carbohydrates. - Monitor A1c levels with current lab work.

## 2024-05-05 NOTE — Assessment & Plan Note (Signed)
 Hyperlipidemia managed with pravastatin  40 mg daily. - Check cholesterol levels with current lab work.

## 2024-05-06 ENCOUNTER — Ambulatory Visit: Payer: Self-pay | Admitting: Family Medicine

## 2024-05-06 ENCOUNTER — Other Ambulatory Visit: Payer: Self-pay | Admitting: Medical Genetics

## 2024-05-06 DIAGNOSIS — E782 Mixed hyperlipidemia: Secondary | ICD-10-CM

## 2024-05-06 LAB — HEMOGLOBIN A1C
Est. average glucose Bld gHb Est-mCnc: 114 mg/dL
Hgb A1c MFr Bld: 5.6 % (ref 4.8–5.6)

## 2024-05-06 LAB — COMPREHENSIVE METABOLIC PANEL WITH GFR
ALT: 17 IU/L (ref 0–44)
AST: 18 IU/L (ref 0–40)
Albumin: 4.5 g/dL (ref 3.8–4.8)
Alkaline Phosphatase: 45 IU/L — ABNORMAL LOW (ref 47–123)
BUN/Creatinine Ratio: 19 (ref 10–24)
BUN: 20 mg/dL (ref 8–27)
Bilirubin Total: 0.5 mg/dL (ref 0.0–1.2)
CO2: 21 mmol/L (ref 20–29)
Calcium: 9.7 mg/dL (ref 8.6–10.2)
Chloride: 106 mmol/L (ref 96–106)
Creatinine, Ser: 1.04 mg/dL (ref 0.76–1.27)
Globulin, Total: 2.1 g/dL (ref 1.5–4.5)
Glucose: 111 mg/dL — ABNORMAL HIGH (ref 70–99)
Potassium: 4 mmol/L (ref 3.5–5.2)
Sodium: 143 mmol/L (ref 134–144)
Total Protein: 6.6 g/dL (ref 6.0–8.5)
eGFR: 76 mL/min/1.73 (ref >59)

## 2024-05-06 LAB — LIPID PANEL
Chol/HDL Ratio: 3.9 ratio (ref 0.0–5.0)
Cholesterol, Total: 171 mg/dL (ref 100–199)
HDL: 44 mg/dL (ref >39)
LDL Chol Calc (NIH): 106 mg/dL — ABNORMAL HIGH (ref 0–99)
Triglycerides: 117 mg/dL (ref 0–149)
VLDL Cholesterol Cal: 21 mg/dL (ref 5–40)

## 2024-05-06 LAB — URIC ACID: Uric Acid: 4.7 mg/dL (ref 3.8–8.4)

## 2024-05-06 MED ORDER — ROSUVASTATIN CALCIUM 5 MG PO TABS: 5.0000 mg | ORAL_TABLET | Freq: Every day | ORAL | 1 refills | Status: AC

## 2024-05-07 ENCOUNTER — Other Ambulatory Visit
Admission: RE | Admit: 2024-05-07 | Discharge: 2024-05-07 | Disposition: A | Discharge status: Home / Self Care | Payer: Self-pay | Source: Ambulatory Visit | Attending: Medical Genetics | Admitting: Medical Genetics

## 2024-05-09 ENCOUNTER — Other Ambulatory Visit: Payer: Self-pay | Admitting: Family Medicine

## 2024-05-09 DIAGNOSIS — I10 Essential (primary) hypertension: Secondary | ICD-10-CM

## 2024-05-18 LAB — GENECONNECT MOLECULAR SCREEN: Genetic Analysis Overall Interpretation: NEGATIVE

## 2024-05-19 ENCOUNTER — Other Ambulatory Visit: Payer: Self-pay

## 2024-05-19 DIAGNOSIS — N138 Other obstructive and reflux uropathy: Secondary | ICD-10-CM

## 2024-05-19 DIAGNOSIS — E291 Testicular hypofunction: Secondary | ICD-10-CM

## 2024-05-19 DIAGNOSIS — E349 Endocrine disorder, unspecified: Secondary | ICD-10-CM

## 2024-05-20 ENCOUNTER — Other Ambulatory Visit

## 2024-05-20 DIAGNOSIS — E349 Endocrine disorder, unspecified: Secondary | ICD-10-CM

## 2024-05-20 DIAGNOSIS — E291 Testicular hypofunction: Secondary | ICD-10-CM

## 2024-05-21 ENCOUNTER — Ambulatory Visit: Payer: Self-pay | Admitting: Urology

## 2024-05-21 LAB — TESTOSTERONE: Testosterone: 424 ng/dL (ref 264–916)

## 2024-06-16 ENCOUNTER — Encounter: Payer: Self-pay | Admitting: Dermatology

## 2024-06-16 ENCOUNTER — Ambulatory Visit: Admitting: Dermatology

## 2024-06-16 DIAGNOSIS — Z86018 Personal history of other benign neoplasm: Secondary | ICD-10-CM

## 2024-06-16 DIAGNOSIS — Z1283 Encounter for screening for malignant neoplasm of skin: Secondary | ICD-10-CM | POA: Diagnosis not present

## 2024-06-16 DIAGNOSIS — L700 Acne vulgaris: Secondary | ICD-10-CM | POA: Diagnosis not present

## 2024-06-16 DIAGNOSIS — D489 Neoplasm of uncertain behavior, unspecified: Secondary | ICD-10-CM

## 2024-06-16 DIAGNOSIS — L814 Other melanin hyperpigmentation: Secondary | ICD-10-CM

## 2024-06-16 DIAGNOSIS — W908XXA Exposure to other nonionizing radiation, initial encounter: Secondary | ICD-10-CM

## 2024-06-16 DIAGNOSIS — D1801 Hemangioma of skin and subcutaneous tissue: Secondary | ICD-10-CM | POA: Diagnosis not present

## 2024-06-16 DIAGNOSIS — L821 Other seborrheic keratosis: Secondary | ICD-10-CM

## 2024-06-16 DIAGNOSIS — D229 Melanocytic nevi, unspecified: Secondary | ICD-10-CM

## 2024-06-16 DIAGNOSIS — L578 Other skin changes due to chronic exposure to nonionizing radiation: Secondary | ICD-10-CM | POA: Diagnosis not present

## 2024-06-16 DIAGNOSIS — D2261 Melanocytic nevi of right upper limb, including shoulder: Secondary | ICD-10-CM

## 2024-06-16 NOTE — Patient Instructions (Addendum)
 Wound Care Instructions  Cleanse wound gently with soap and water once a day then pat dry with clean gauze. Apply a thin coat of Petrolatum (petroleum jelly, Vaseline) over the wound (unless you have an allergy to this). We recommend that you use a new, sterile tube of Vaseline. Do not pick or remove scabs. Do not remove the yellow or white healing tissue from the base of the wound.  Cover the wound with fresh, clean, nonstick gauze and secure with paper tape. You may use Band-Aids in place of gauze and tape if the wound is small enough, but would recommend trimming much of the tape off as there is often too much. Sometimes Band-Aids can irritate the skin.  You should call the office for your biopsy report after 1 week if you have not already been contacted.  If you experience any problems, such as abnormal amounts of bleeding, swelling, significant bruising, significant pain, or evidence of infection, please call the office immediately.  FOR ADULT SURGERY PATIENTS: If you need something for pain relief you may take 1 extra strength Tylenol (acetaminophen) AND 2 Ibuprofen (200mg  each) together every 4 hours as needed for pain. (do not take these if you are allergic to them or if you have a reason you should not take them.) Typically, you may only need pain medication for 1 to 3 days.      Recommend OTC adapalene 0.1% gel pea sized amount to entire face nightly as tolerated.  This can be used to treat acne (whiteheads, blackheads) and milia (tiny firm white cysts).  It may cause dry irritated skin with initial use, and to minimize this, we recommend applying a light moisturizer to face before applying adapalene and/or applying it less frequently.  OTC brands include Differin 0.1% gel (Galderma), Adapalene 0.1% gel (Neutrogena), and Effaclar gel ( La Roche Posay).  They are found in the acne section in the pharmacy.    Seborrheic Keratosis  What causes seborrheic keratoses? Seborrheic  keratoses are harmless, common skin growths that first appear during adult life.  As time goes by, more growths appear.  Some people may develop a large number of them.  Seborrheic keratoses appear on both covered and uncovered body parts.  They are not caused by sunlight.  The tendency to develop seborrheic keratoses can be inherited.  They vary in color from skin-colored to gray, brown, or even black.  They can be either smooth or have a rough, warty surface.   Seborrheic keratoses are superficial and look as if they were stuck on the skin.  Under the microscope this type of keratosis looks like layers upon layers of skin.  That is why at times the top layer may seem to fall off, but the rest of the growth remains and re-grows.    Treatment Seborrheic keratoses do not need to be treated, but can easily be removed in the office.  Seborrheic keratoses often cause symptoms when they rub on clothing or jewelry.  Lesions can be in the way of shaving.  If they become inflamed, they can cause itching, soreness, or burning.  Removal of a seborrheic keratosis can be accomplished by freezing, burning, or surgery. If any spot bleeds, scabs, or grows rapidly, please return to have it checked, as these can be an indication of a skin cancer.     Due to recent changes in healthcare laws, you may see results of your pathology and/or laboratory studies on MyChart before the doctors have had a chance  to review them. We understand that in some cases there may be results that are confusing or concerning to you. Please understand that not all results are received at the same time and often the doctors may need to interpret multiple results in order to provide you with the best plan of care or course of treatment. Therefore, we ask that you please give us  2 business days to thoroughly review all your results before contacting the office for clarification. Should we see a critical lab result, you will be contacted  sooner.   If You Need Anything After Your Visit  If you have any questions or concerns for your doctor, please call our main line at [phone] and press option 4 to reach your doctor's medical assistant. If no one answers, please leave a voicemail as directed and we will return your call as soon as possible. Messages left after 4 pm will be answered the following business day.   You may also send us  a message via MyChart. We typically respond to MyChart messages within 1-2 business days.  For prescription refills, please ask your pharmacy to contact our office. Our fax number is [phone].  If you have an urgent issue when the clinic is closed that cannot wait until the next business day, you can page your doctor at the number below.    Please note that while we do our best to be available for urgent issues outside of office hours, we are not available 24/7.   If you have an urgent issue and are unable to reach us , you may choose to seek medical care at your doctor's office, retail clinic, urgent care center, or emergency room.  If you have a medical emergency, please immediately call 911 or go to the emergency department.  Pager Numbers  - Dr. Hester: [phone]  - Dr. Jackquline: [phone]  - Dr. Claudene: [phone]   - Dr. Raymund: [phone]  In the event of inclement weather, please call our main line at [phone] for an update on the status of any delays or closures.  Dermatology Medication Tips: Please keep the boxes that topical medications come in in order to help keep track of the instructions about where and how to use these. Pharmacies typically print the medication instructions only on the boxes and not directly on the medication tubes.   If your medication is too expensive, please contact our office at [phone] option 4 or send us  a message through MyChart.   We are unable to tell what your co-pay for medications will be in advance as this is  different depending on your insurance coverage. However, we may be able to find a substitute medication at lower cost or fill out paperwork to get insurance to cover a needed medication.   If a prior authorization is required to get your medication covered by your insurance company, please allow us  1-2 business days to complete this process.  Drug prices often vary depending on where the prescription is filled and some pharmacies may offer cheaper prices.  The website www.goodrx.com contains coupons for medications through different pharmacies. The prices here do not account for what the cost may be with help from insurance (it may be cheaper with your insurance), but the website can give you the price if you did not use any insurance.  - You can print the associated coupon and take it with your prescription to the pharmacy.  - You may also stop by our office during regular business hours and  pick up a GoodRx coupon card.  - If you need your prescription sent electronically to a different pharmacy, notify our office through [facility] or by phone at [phone] option 4.     Si Usted Necesita Algo Despus de Su Visita  Tambin puede enviarnos un mensaje a travs de Clinical Cytogeneticist. Por lo general respondemos a los mensajes de MyChart en el transcurso de 1 a 2 das hbiles.  Para renovar recetas, por favor pida a su farmacia que se ponga en contacto con nuestra oficina. Randi lakes de fax es [place] [phone].  Si tiene un asunto urgente cuando la clnica est cerrada y que no puede esperar hasta el siguiente da hbil, puede llamar/localizar a su doctor(a) al nmero que aparece a continuacin.   Por favor, tenga en cuenta que aunque hacemos todo lo posible para estar disponibles para asuntos urgentes fuera del horario de [place], no estamos disponibles las 24 horas del da, los 7 [address]  [phone] de la [place].   Si tiene un problema urgente y no puede comunicarse con nosotros, puede optar por buscar  atencin mdica  en el consultorio de su doctor(a), en una clnica privada, en un centro de atencin urgente o en una sala de emergencias.  Si tiene engineer, drilling, por favor llame inmediatamente al 911 o vaya a la sala de emergencias.  Nmeros de bper  - Dr. Hester: [phone]  - Dra. Jackquline: [phone]  - Dr. Claudene: [phone]  - Dra. Kitts: [phone]  En caso de inclemencias del [place], por favor llame a nuestra lnea principal al [phone] para una actualizacin sobre el estado de cualquier retraso o cierre.  Consejos para la medicacin en dermatologa: Por favor, guarde las cajas en las que vienen los medicamentos de uso tpico para ayudarle a seguir las instrucciones sobre dnde y cmo usarlos. Las farmacias generalmente imprimen las instrucciones del medicamento slo en las cajas y no directamente en los tubos del [place].   Si su medicamento es muy caro, por favor, pngase en contacto con landry rieger llamando al [phone] y presione la opcin 4 o envenos un mensaje a travs de Clinical Cytogeneticist.   No podemos decirle cul ser su copago por los medicamentos por adelantado ya que esto es diferente dependiendo de la cobertura de su seguro. Sin embargo, es posible que podamos encontrar un medicamento sustituto a audiological scientist un formulario para que el seguro cubra el medicamento que se considera necesario.   Si se requiere una autorizacin previa para que su compaa de seguros cubra su medicamento, por favor permtanos de 1 a 2 das hbiles para completar este proceso.  Los precios de los medicamentos varan con frecuencia dependiendo del environmental consultant de dnde se surte la receta y alguna farmacias pueden ofrecer precios ms baratos.  El sitio web www.goodrx.com tiene cupones para medicamentos de health and safety inspector. Los precios aqu no tienen en cuenta lo que podra costar con la ayuda del seguro (puede ser ms barato con su seguro), pero el sitio web puede  darle el precio si no utiliz tourist information centre manager.  - Puede imprimir el cupn correspondiente y llevarlo con su receta a la farmacia.  - Tambin puede pasar por nuestra oficina durante el horario de atencin regular y education officer, museum una tarjeta de cupones de GoodRx.  - Si necesita que su receta se enve electrnicamente a una farmacia diferente, informe a nuestra oficina a travs de MyChart de [place] o por telfono llamando al [phone] y presione la opcin 4.

## 2024-06-16 NOTE — Progress Notes (Signed)
 Follow-Up Visit   Subjective  Curtis [name] is a 73 y.o. male who presents for the following: Skin Cancer Screening and Full Body Skin Exam; hx of dysplastic nevus. Patient reports area of concern on the chest- bothersome and itchy, also spot on r upper arm to check.   The patient presents for Total-Body Skin Exam (TBSE) for skin cancer screening and mole check. The patient has spots, moles and lesions to be evaluated, some may be new or changing and the patient may have concern these could be cancer.    The following portions of the chart were reviewed this encounter and updated as appropriate: medications, allergies, medical history  Review of Systems:  No other skin or systemic complaints except as noted in HPI or Assessment and Plan.  Objective  Well appearing patient in no apparent distress; mood and affect are within normal limits.  A full examination was performed including scalp, head, eyes, ears, nose, lips, neck, chest, axillae, abdomen, back, buttocks, bilateral upper extremities, bilateral lower extremities, hands, feet, fingers, toes, fingernails, and toenails. All findings within normal limits unless otherwise noted below.   Relevant physical exam findings are noted in the Assessment and Plan.  Lower sternum 3mm red papule   Right Upper Arm 3mm crusted flesh papule    Assessment & Plan   SKIN CANCER SCREENING PERFORMED TODAY.  ACTINIC DAMAGE - Chronic condition, secondary to cumulative UV/sun exposure - diffuse scaly erythematous macules with underlying dyspigmentation - Recommend daily broad spectrum sunscreen SPF 30+ to sun-exposed areas, reapply every 2 hours as needed.  - Staying in the shade or wearing long sleeves, sun glasses (UVA+UVB protection) and wide brim hats (4-inch brim around the entire circumference of the hat) are also recommended for sun protection.  - Call for new or changing lesions.  LENTIGINES, SEBORRHEIC KERATOSES, HEMANGIOMAS - Benign  normal skin lesions - Benign-appearing - Call for any changes  MELANOCYTIC NEVI - Tan-brown and/or pink-flesh-colored symmetric macules and papules - Benign appearing on exam today - Observation - Call clinic for new or changing moles - Recommend daily use of broad spectrum spf 30+ sunscreen to sun-exposed areas.   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  Call if any new or changing lesions are noted between office visits  ACNE VULGARIS Exam: Open and closed comedones on the nose   Chronic and persistent condition with duration or expected duration over one year. Condition is symptomatic/ bothersome to patient. Not currently at goal.  Treatment Plan: Recommend OTC adapalene 0.1% gel pea sized amount to entire face nightly as tolerated.  This can be used to treat acne (whiteheads, blackheads) and milia (tiny firm white cysts).  It may cause dry irritated skin with initial use, and to minimize this, we recommend applying a light moisturizer to face before applying adapalene and/or applying it less frequently.  OTC brands include Differin 0.1% gel (Galderma), Adapalene 0.1% gel (Neutrogena), and Effaclar gel ( La Roche Posay).  They are found in the acne section in the pharmacy.   NEOPLASM OF UNCERTAIN BEHAVIOR (2) Lower sternum Epidermal / dermal shaving  Lesion diameter (cm):  0.3 Informed consent: discussed and consent obtained   Patient was prepped and draped in usual sterile fashion: area prepped with alcohol. Anesthesia: the lesion was anesthetized in a standard fashion  Anesthetic:  1% lidocaine  w/ epinephrine  1-100,000 buffered w/ 8.4% NaHCO3 Instrument used: flexible razor blade   Hemostasis achieved with: pressure, aluminum chloride and electrodesiccation   Outcome: patient  tolerated procedure well   Post-procedure details: wound care instructions given   Post-procedure details comment:  Ointment and small bandage applied  Specimen 1 - Surgical pathology Differential Diagnosis: Hemangioma vs other r/o dysplasia   Check Margins: Yes Right Upper Arm Epidermal / dermal shaving  Lesion diameter (cm):  0.3 Informed consent: discussed and consent obtained   Patient was prepped and draped in usual sterile fashion: area prepped with alcohol. Anesthesia: the lesion was anesthetized in a standard fashion   Anesthetic:  1% lidocaine  w/ epinephrine  1-100,000 buffered w/ 8.4% NaHCO3 Instrument used: flexible razor blade   Hemostasis achieved with: pressure, aluminum chloride and electrodesiccation   Outcome: patient tolerated procedure well   Post-procedure details: wound care instructions given   Post-procedure details comment:  Ointment and small bandage applied  Specimen 2 - Surgical pathology Differential Diagnosis: Irritated nevus r/o dysplasia   Check Margins: Yes Return in about 1 year (around 06/16/2025) for TBSE.  I, Emerick Ege, CMA am acting as scribe for Rexene Rattler, MD.   Documentation: I have reviewed the above documentation for accuracy and completeness, and I agree with the above.  Rexene Rattler, MD

## 2024-06-18 LAB — SURGICAL PATHOLOGY

## 2024-06-22 ENCOUNTER — Ambulatory Visit: Payer: Self-pay | Admitting: Dermatology

## 2024-06-22 NOTE — Telephone Encounter (Signed)
-----   Message from Rexene Rattler sent at 06/22/2024 11:44 AM EST ----- 1. Skin, lower sternum :       HEMANGIOMA, BASE INVOLVED  2. Skin, right upper arm :       MELANOCYTIC NEVUS, INTRADERMAL TYPE, BASE INVOLVED  Benign blood mole Benign mole   - please call patient ----- Message ----- From: Interface, Lab In [facility] Sent: 06/18/2024   5:44 PM EST To: Rexene Rattler, MD

## 2024-06-22 NOTE — Telephone Encounter (Signed)
Advised patient biopsies were both benign and no further treatment needed.

## 2024-07-03 ENCOUNTER — Other Ambulatory Visit: Payer: Self-pay | Admitting: Family Medicine

## 2024-07-03 DIAGNOSIS — M1A9XX1 Chronic gout, unspecified, with tophus (tophi): Secondary | ICD-10-CM

## 2024-07-06 NOTE — Telephone Encounter (Signed)
 LOV- 05/05/2024 NOV- 11/16/2024 LRF- 07/11/2023 Last labs- 05/05/2024 Uric Acid 4.7

## 2024-07-14 ENCOUNTER — Other Ambulatory Visit: Payer: Self-pay | Admitting: Family Medicine

## 2024-07-14 DIAGNOSIS — I10 Essential (primary) hypertension: Secondary | ICD-10-CM

## 2024-08-11 ENCOUNTER — Other Ambulatory Visit: Payer: Self-pay | Admitting: Family Medicine

## 2024-08-11 DIAGNOSIS — E782 Mixed hyperlipidemia: Secondary | ICD-10-CM

## 2024-08-15 ENCOUNTER — Other Ambulatory Visit: Payer: Self-pay | Admitting: Family Medicine

## 2024-08-25 ENCOUNTER — Other Ambulatory Visit: Payer: Self-pay

## 2024-08-25 DIAGNOSIS — E349 Endocrine disorder, unspecified: Secondary | ICD-10-CM

## 2024-09-07 ENCOUNTER — Other Ambulatory Visit

## 2024-09-09 ENCOUNTER — Ambulatory Visit: Admitting: Urology

## 2024-10-14 ENCOUNTER — Ambulatory Visit

## 2024-11-16 ENCOUNTER — Encounter: Admitting: Family Medicine

## 2025-06-22 ENCOUNTER — Encounter: Admitting: Dermatology
